# Patient Record
Sex: Female | Born: 1980 | Race: Black or African American | Hispanic: No | Marital: Married | Smoking: Former smoker
Health system: Southern US, Community
[De-identification: ages and names within clinical notes are randomized; demographics above are authoritative.]

## PROBLEM LIST (undated history)

## (undated) DIAGNOSIS — J45909 Unspecified asthma, uncomplicated: Secondary | ICD-10-CM

## (undated) DIAGNOSIS — F410 Panic disorder [episodic paroxysmal anxiety] without agoraphobia: Secondary | ICD-10-CM

## (undated) DIAGNOSIS — M545 Low back pain, unspecified: Secondary | ICD-10-CM

## (undated) DIAGNOSIS — K5090 Crohn's disease, unspecified, without complications: Secondary | ICD-10-CM

## (undated) DIAGNOSIS — E010 Iodine-deficiency related diffuse (endemic) goiter: Secondary | ICD-10-CM

## (undated) DIAGNOSIS — I10 Essential (primary) hypertension: Secondary | ICD-10-CM

## (undated) DIAGNOSIS — F32A Depression, unspecified: Secondary | ICD-10-CM

## (undated) DIAGNOSIS — Z972 Presence of dental prosthetic device (complete) (partial): Secondary | ICD-10-CM

## (undated) DIAGNOSIS — F419 Anxiety disorder, unspecified: Secondary | ICD-10-CM

## (undated) DIAGNOSIS — F329 Major depressive disorder, single episode, unspecified: Secondary | ICD-10-CM

## (undated) DIAGNOSIS — Z630 Problems in relationship with spouse or partner: Secondary | ICD-10-CM

## (undated) DIAGNOSIS — M2550 Pain in unspecified joint: Secondary | ICD-10-CM

## (undated) DIAGNOSIS — T7840XA Allergy, unspecified, initial encounter: Secondary | ICD-10-CM

## (undated) DIAGNOSIS — K219 Gastro-esophageal reflux disease without esophagitis: Secondary | ICD-10-CM

## (undated) DIAGNOSIS — G43909 Migraine, unspecified, not intractable, without status migrainosus: Secondary | ICD-10-CM

## (undated) DIAGNOSIS — R569 Unspecified convulsions: Secondary | ICD-10-CM

## (undated) HISTORY — DX: Allergy, unspecified, initial encounter: T78.40XA

## (undated) HISTORY — PX: SMALL INTESTINE SURGERY: SHX150

## (undated) HISTORY — DX: Anxiety disorder, unspecified: F41.9

## (undated) HISTORY — DX: Iodine-deficiency related diffuse (endemic) goiter: E01.0

## (undated) HISTORY — DX: Low back pain, unspecified: M54.50

## (undated) HISTORY — DX: Depression, unspecified: F32.A

## (undated) HISTORY — DX: Major depressive disorder, single episode, unspecified: F32.9

## (undated) HISTORY — DX: Panic disorder (episodic paroxysmal anxiety): F41.0

## (undated) HISTORY — DX: Unspecified convulsions: R56.9

## (undated) HISTORY — DX: Low back pain: M54.5

## (undated) HISTORY — DX: Problems in relationship with spouse or partner: Z63.0

---

## 2005-10-27 HISTORY — PX: CHOLECYSTECTOMY: SHX55

## 2006-05-30 ENCOUNTER — Emergency Department: Payer: Self-pay | Admitting: Emergency Medicine

## 2006-06-15 ENCOUNTER — Ambulatory Visit: Payer: Self-pay | Admitting: Gastroenterology

## 2006-07-08 ENCOUNTER — Ambulatory Visit: Payer: Self-pay | Admitting: General Surgery

## 2006-10-19 ENCOUNTER — Emergency Department: Payer: Self-pay | Admitting: Emergency Medicine

## 2007-10-24 ENCOUNTER — Emergency Department: Payer: Self-pay | Admitting: Emergency Medicine

## 2008-05-18 ENCOUNTER — Inpatient Hospital Stay (HOSPITAL_COMMUNITY): Admission: RE | Admit: 2008-05-18 | Discharge: 2008-05-20 | Payer: Self-pay | Admitting: Obstetrics and Gynecology

## 2008-10-27 HISTORY — PX: TUBAL LIGATION: SHX77

## 2011-03-11 NOTE — Op Note (Signed)
NAME:  Cheryl Flowers, Cheryl Flowers NO.:  [id number]   MEDICAL RECORD NO.:  [id number]          PATIENT TYPE:  INP   LOCATION:  9128                          FACILITY:  [facility]   PHYSICIAN:  Jonnie Kind, M.D. DATE OF BIRTH:  Oct 28, 1980   DATE OF PROCEDURE:  DATE OF DISCHARGE:                               OPERATIVE REPORT   PREOPERATIVE DIAGNOSES:  Pregnancy 40 plus weeks, repeat cesarean  section, off trial of labor, and elective permanent sterilization.   POSTOPERATIVE DIAGNOSES:  Pregnancy 40 plus weeks, repeat cesarean  section, off trial of labor, and elective permanent sterilization, and  extensive abdominal adhesions in lower uterine segment.   PROCEDURE:  Repeat low-transverse cervical cesarean section.   SURGEON:  Jonnie Kind, MD   ASSISTANT:  Dr. Patrice Paradise.   ANESTHESIA:  Spinal.   COMPLICATIONS:  Continued oozing from myometrial surface of lower  uterine segment adhesions requiring multiple sutures.   FINDINGS:  A healthy infant with Apgars 8 and 9.  Weight, please see  pediatrics notes.   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   DETAILS OF PROCEDURE:  The patient was taken to the operating room,  prepped and draped with lower abdominal surgery, and ellipse of skin was  removed approximately 6-7 cm wide x 30 cm in transverse length.  Removed  the skin and the old fibrotic scar, which was deep down in the lower  abdominal crease and then, we entered the fascia with transverse opening  of the fascia in standard method of Pfannenstiel.  Rectus muscles were  split in the midline and peritoneum entered without difficulty.  There  was extensive adhesions identified from the lower uterine segment to the  anterior abdominal wall and these required  resection before we could get  down to the lower uterine segment sufficiently to perform a transverse  uterine incision.  The bladder flap was developed minimally, and  transverse incision extended laterally with an index finger traction.  Fetal vertex guided in the incision and fundal pressure applied.  Malstrom flexible vacuum extractor was used to assist with the  extraction and the infant delivered easily.  The cord was clamped and  the infant taken to the nursery.  See pediatrics for further details on  the baby.   Cord blood was obtained.  Placenta was delivered by Crede uterine  massage and then the uterus inspected and confirmed as being  satisfactorily emptied.  Singular running-locking closure of the uterine  incision was performed followed by inspection of the lower uterine  segment.  There was diffuse oozing from multiple sites where the  adhesions have been transected and we used at least a dozen interrupted  sutures, a couple of figure-of-eight sutures, and some 2-0 running 2-0  chromic in that area to complete adequate hemostasis.  Point cautery was  used.  Separate film was attempted to be applied over the area once  hemostasis was adequate, but  we were unsuccessful in application.   Tubal ligation:  Tubal ligation was then performed by placing a Filshie  clip on the midportion of each tube, which was identified with  fimbriated end before application of the Filshie clip.  Hemostasis was  satisfactory.  Anterior peritoneum was closed with running 2-0 chromic.  Fascia closed with running 0 Vicryl, subcu fatty tissues recontoured  slightly and reapproximated with interrupted 2-0 plain and staple  closure of the skin, and completed the procedure.  Sponge and needle  counts were correct.   EBL:  1000 mL.      Jonnie Kind, M.D.  Electronically Signed     JVF/MEDQ  D:  05/18/2008  T:  05/19/2008  Job:  [id number]   cc:   Meda Klinefelter  Fax: [phone]

## 2011-03-11 NOTE — H&P (Signed)
NAME:  Cheryl Flowers, Cheryl Flowers NO.:  [id number]   MEDICAL RECORD NO.:  [id number]          PATIENT TYPE:  INP   LOCATION:  NA                            FACILITY:  [facility]   PHYSICIAN:  Jonnie Kind, M.D. DATE OF BIRTH:  Oct 29, 1980   DATE OF ADMISSION:  DATE OF DISCHARGE:                              HISTORY & PHYSICAL   ADMITTING DIAGNOSES:  Pregnancy 40 weeks 2 days, prior cesarean section  declining vaginal birth after cesarean due to large infant size and high  presenting part, and elective permanent sterilization.   HISTORY OF PRESENT ILLNESS:  This 30 year old female gravida 3, para 1,  AB1, LMP August 10, 2007, placing [facility], May 16, 2008, with  corresponding 12 and 20-week ultrasound with 34-week ultrasound  suggesting increased fetal growth based on the [facility] moving up to May 07, 2008, is admitted at 40 weeks 2 days for repeat cesarean section and  tubal ligation.  She has been seen for final prenatal visit on May 15, 2008, where estimated fetal weight per ultrasound is 3978 g and with  presenting part remaining at -3 station, not even felt this at all.  Review of her old record shows that the patient was induced to 40 weeks  and never progressed past 4 cm before going to low-transverse cervical  cesarean section after 4 hours of arrested labor.  Pros and cons of  proceeding with VBAC versus scheduling C-section were discussed, and the  patient desires to proceed with a repeat cesarean section.  Additionally, she desires permanent sterilization, understands the  intended permanency of the procedure and low reversibility.   PAST MEDICAL HISTORY:  Benign.   PAST SURGICAL HISTORY:  Cholecystectomy 2007.   ALLERGIES:  Allergies to MORPHINE and SULFA.  She has no LATEX  allergies.   SOCIAL HISTORY:  Married, lives with baby's father, Cheryl Flowers.  She  has a female child.  She plans circumcision.  She plans to breastfeed.  Tubal papers were signed March 28, 2008.   PHYSICAL EXAMINATION:  VITAL SIGNS:  Height 5 feet 4.5 inches, weight  202, and blood pressure 112/60.  GENERAL:  Shows a healthy African American female, alert and oriented  x3.  HEENT:  Pupils equal, round, and reactive.  NECK:  Supple.  CARDIOVASCULAR:  Unremarkable.  ABDOMEN:  40-41 cm, estimated fetal weight 3978 g, AFI 6.77.   PLAN:  Repeat cesarean section and tubal ligation, May 18, 2008.   Blood type B negative, RhoGAM administered in [place] early in the  pregnancy, rubella immunity present, hemoglobin 10, hematocrit 31,  hepatitis negative, and HIV negative.  RPR, GC, and Chlamydia all  negative.  Pap smear ASCUS and MSAFP normal.      Jonnie Kind, M.D.  Electronically Signed     JVF/MEDQ  D:  05/15/2008  T:  05/16/2008  Job:  [id number]   cc:   [facility]

## 2011-03-11 NOTE — Discharge Summary (Signed)
NAME:  Cheryl Flowers, Cheryl Flowers NO.:  [id number]   MEDICAL RECORD NO.:  [id number]          PATIENT TYPE:  INP   LOCATION:  9128                          FACILITY:  [facility]   PHYSICIAN:  Willey Blade, MD  DATE OF BIRTH:  05-30-1981   DATE OF ADMISSION:  05/18/2008  DATE OF DISCHARGE:  05/20/2008                               DISCHARGE SUMMARY   ADMITTING DIAGNOSES:  1. Pregnancy at 40 weeks and 2 days.  2. Prior cesarean section.  3. Declining vaginal birth after cesarean due to large infant size and      high-presenting part.  4. An elective permanent sterilization.   DISCHARGE DIAGNOSES:  1. Low-transverse cesarean section.  2. Bilateral tubal ligation.   PREOPERATIVE LABORATORY DATA:  White blood cell count of 8.0, hemoglobin  11.2, hematocrit 33.6, and platelets 279.  RPR was negative.  Blood type  is B negative.  HIV nonreactive.   DISCHARGE LABORATORY DATA:  Performed on May 20, 2008.  White blood  cell count 7.9, hemoglobin 8.2, hematocrit 24.0, and platelets 239.   HOSPITAL COURSE:  Cheryl Flowers underwent a repeat low-transverse  cesarean section on May 18, 2008 with bilateral tubal ligation  performed by Dr. Mallory Shirk.  The procedure was uncomplicated.  She  had an 8-pound 0-ounce female infant, whose nursery course was normal.  Her hospital stay was uneventful.  She remained afebrile throughout her  3 days here.   Her physical exam upon discharge is within normal limits.  See progress  note.   DISCHARGE MEDICATIONS:  1. Percocet 5/325 one p.o. q.4 h. as needed for pain, #30, no refills.  2. Motrin 600 mg one p.o. q.6 h. as needed for pain, #30, with no      refills.  3. Colace 100 mg one p.o. b.i.d. while on pain medicines.  4. She is also to continue with her iron supplement daily for 1 month.   The patient and family member verbalized understanding of the discharge  instructions including activity restrictions until the followup  appointment at [facility] on Monday for staple removal.      Christin Fudge, C.N.M.      Willey Blade, MD  Electronically Signed    FC/MEDQ  D:  05/20/2008  T:  05/20/2008  Job:  [id number]   cc:   [facility]

## 2011-03-29 DIAGNOSIS — S3992XA Unspecified injury of lower back, initial encounter: Secondary | ICD-10-CM

## 2011-03-29 DIAGNOSIS — M549 Dorsalgia, unspecified: Secondary | ICD-10-CM | POA: Insufficient documentation

## 2011-05-02 ENCOUNTER — Emergency Department: Payer: Self-pay | Admitting: Emergency Medicine

## 2011-07-25 LAB — CBC
HCT: 24 — ABNORMAL LOW
HCT: 25.5 — ABNORMAL LOW
HCT: 33.6 — ABNORMAL LOW
Hemoglobin: 11.2 — ABNORMAL LOW
Hemoglobin: 8.2 — ABNORMAL LOW
Hemoglobin: 8.7 — ABNORMAL LOW
MCHC: 33.4
MCHC: 34.1
MCHC: 34.3
MCV: 99.1
MCV: 99.2
MCV: 99.5
Platelets: 234
Platelets: 239
Platelets: 279
RBC: 2.42 — ABNORMAL LOW
RBC: 2.57 — ABNORMAL LOW
RBC: 3.39 — ABNORMAL LOW
RDW: 13.4
RDW: 13.7
RDW: 13.8
WBC: 10.8 — ABNORMAL HIGH
WBC: 7.9
WBC: 8

## 2011-07-25 LAB — TYPE AND SCREEN
ABO/RH(D): B NEG
Antibody Screen: NEGATIVE

## 2011-07-25 LAB — HIV ANTIBODY (ROUTINE TESTING W REFLEX): HIV: NONREACTIVE

## 2011-07-25 LAB — ABO/RH: ABO/RH(D): B NEG

## 2011-07-25 LAB — RPR: RPR Ser Ql: NONREACTIVE

## 2013-03-12 ENCOUNTER — Emergency Department: Payer: Self-pay | Admitting: Emergency Medicine

## 2015-10-09 ENCOUNTER — Other Ambulatory Visit (HOSPITAL_COMMUNITY): Payer: Self-pay | Admitting: Family Medicine

## 2015-10-09 DIAGNOSIS — E049 Nontoxic goiter, unspecified: Secondary | ICD-10-CM

## 2015-10-15 ENCOUNTER — Ambulatory Visit (HOSPITAL_COMMUNITY)
Admission: RE | Admit: 2015-10-15 | Discharge: 2015-10-15 | Disposition: A | Discharge status: Home / Self Care | Payer: BLUE CROSS/BLUE SHIELD | Source: Ambulatory Visit | Attending: Family Medicine | Admitting: Family Medicine

## 2015-10-15 DIAGNOSIS — E042 Nontoxic multinodular goiter: Secondary | ICD-10-CM | POA: Diagnosis not present

## 2015-10-15 DIAGNOSIS — E049 Nontoxic goiter, unspecified: Secondary | ICD-10-CM | POA: Diagnosis present

## 2015-12-15 ENCOUNTER — Encounter: Payer: Self-pay | Admitting: Emergency Medicine

## 2015-12-15 ENCOUNTER — Emergency Department
Admission: EM | Admit: 2015-12-15 | Discharge: 2015-12-15 | Disposition: A | Discharge status: Home / Self Care | Payer: BLUE CROSS/BLUE SHIELD | Attending: Emergency Medicine | Admitting: Emergency Medicine

## 2015-12-15 DIAGNOSIS — J020 Streptococcal pharyngitis: Secondary | ICD-10-CM | POA: Insufficient documentation

## 2015-12-15 DIAGNOSIS — J029 Acute pharyngitis, unspecified: Secondary | ICD-10-CM | POA: Diagnosis present

## 2015-12-15 DIAGNOSIS — F17210 Nicotine dependence, cigarettes, uncomplicated: Secondary | ICD-10-CM | POA: Insufficient documentation

## 2015-12-15 LAB — POCT RAPID STREP A: Streptococcus, Group A Screen (Direct): POSITIVE — AB

## 2015-12-15 MED ORDER — AMOXICILLIN 875 MG PO TABS: 875.0000 mg | ORAL_TABLET | Freq: Two times a day (BID) | ORAL | Status: DC

## 2015-12-15 MED ORDER — MAGIC MOUTHWASH W/LIDOCAINE: 5.0000 mL | Freq: Four times a day (QID) | ORAL | Status: DC | PRN

## 2015-12-15 MED ORDER — PREDNISONE 20 MG PO TABS: ORAL_TABLET | ORAL | Status: DC

## 2015-12-15 NOTE — Discharge Instructions (Signed)

## 2015-12-15 NOTE — ED Provider Notes (Signed)
[facility] Emergency Department Provider Note  ____________________________________________  Time seen: Approximately 6:15 PM  I have reviewed the triage vital signs and the nursing notes.   HISTORY  Chief Complaint Sore Throat    HPI Cheryl Flowers is a 35 y.o. female , NAD, presents emergency department with pain and swelling about the throat and fever since yesterday.Has been able to swallow liquids but significantly painful. Notes some stiffness and pain in her neck as well. Felt her right ear was clogged 2 days ago but that has resolved. Has some mild pressure in the right ear. Denies any headaches, chest pain, shortness of breath, cough. No body aches. Denies any sick exposures. Took Tylenol yesterday evening which seemed to help with fever.   No past medical history on file.  There are no active problems to display for this patient.   No past surgical history on file.  Current Outpatient Rx  Name  Route  Sig  Dispense  Refill  . amoxicillin (AMOXIL) 875 MG tablet   Oral   Take 1 tablet (875 mg total) by mouth 2 (two) times daily.   20 tablet   0   . magic mouthwash w/lidocaine SOLN   Oral   Take 5 mLs by mouth 4 (four) times daily as needed for mouth pain.   240 mL   0     Please mix 32m diphenhydramine, 862mnystatin, 80 ...   . predniSONE (DELTASONE) 20 MG tablet      Take 2 tablets by mouth, once daily, for 5 days   10 tablet   0     Allergies Morphine and related and Sulfa antibiotics  No family history on file.  Social History Social History  Substance Use Topics  . Smoking status: Current Every Day Smoker    Types: Cigarettes  . Smokeless tobacco: Never Used  . Alcohol Use: No     Review of Systems  Constitutional: Positive fever/chills Eyes: No visual changes. No discharge ENT: Positive sore throat, ear pressure. No nasal congestion, runny nose, sinus pressure. Cardiovascular: No chest  pain. Respiratory: No cough. No shortness of breath. No wheezing.  Gastrointestinal: No abdominal pain.  No nausea, vomiting.   Musculoskeletal: Positive for neck pain. Negative for back pain nor myalgias.  Skin: Negative for rash. Neurological: Negative for headaches, focal weakness or numbness. 10-point ROS otherwise negative.  ____________________________________________   PHYSICAL EXAM:  VITAL SIGNS: ED Triage Vitals  Enc Vitals Group     BP 12/15/15 1644 110/74 mmHg     Pulse Rate 12/15/15 1644 99     Resp 12/15/15 1644 18     Temp 12/15/15 1644 99 F (37.2 C)     Temp Source 12/15/15 1644 Oral     SpO2 12/15/15 1644 100 %     Weight 12/15/15 1644 203 lb (92.08 kg)     Height 12/15/15 1644 5' 5"  (1.651 m)     Head Cir --      Peak Flow --      Pain Score 12/15/15 1644 10     Pain Loc --      Pain Edu? --      Excl. in [place]--     Constitutional: Alert and oriented. Well appearing and in no acute distress. Eyes: Conjunctivae are normal. PERRL. EOMI without pain.  Head: Atraumatic. ENT:      Ears: Right TM visualized with a dusky color, bulging, serous effusion with decreased light reflex. Left ear  with mild bulging and trace serous effusion but no erythema or perforation. Bilateral external ear canals without erythema, swelling, discharge.      Nose: No congestion/rhinnorhea.      Mouth/Throat:  Pharynx with moderate erythema and white exudate about bilateral tonsillar areas. Mild swelling is noted. Clear postnasal drip Mucous membranes are moist.  Neck: No stridor. No cervical spine tenderness to palpation. Neck is supple with full range of motion. Hematological/Lymphatic/Immunilogical: Positive focal bilateral anterior cervical lymphadenopathy with mild tenderness to palpation but are mobile. Cardiovascular: Normal rate, regular rhythm. Normal S1 and S2.  Good peripheral circulation. Respiratory: Normal respiratory effort without tachypnea or retractions. Lungs  CTAB. Neurologic:  Normal speech and language. No gross focal neurologic deficits are appreciated.  Skin:  Skin is warm, dry and intact. No rash noted. Psychiatric: Mood and affect are normal. Speech and behavior are normal. Patient exhibits appropriate insight and judgement.   ____________________________________________   LABS (all labs ordered are listed, but only abnormal results are displayed)  Labs Reviewed - No data to display ____________________________________________  EKG  None ____________________________________________  RADIOLOGY  None ____________________________________________    PROCEDURES  Procedure(s) performed: None   Medications - No data to display   ____________________________________________   INITIAL IMPRESSION / ASSESSMENT AND PLAN / ED COURSE  Pertinent lab results that were available during my care of the patient were reviewed by me and considered in my medical decision making (see chart for details).  Patient's diagnosis is consistent with strep pharyngitis. Patient will be discharged home with prescriptions for amoxicillin 875 mg tablets to take one tablet by mouth twice daily for 10 days. Will also give magic mouthwash with lidocaine and prednisone 20 mg tablets to take 2 tablets by mouth once daily for 5 days to decrease swelling and pain. May continue Tylenol as needed. Push fluids to remain hydrated. Patient is to follow up with primary care provider if symptoms persist past this treatment course. Patient is given ED precautions to return to the ED for any worsening or new symptoms.    ____________________________________________  FINAL CLINICAL IMPRESSION(S) / ED DIAGNOSES  Final diagnoses:  None      NEW MEDICATIONS STARTED DURING THIS VISIT:  New Prescriptions   AMOXICILLIN (AMOXIL) 875 MG TABLET    Take 1 tablet (875 mg total) by mouth 2 (two) times daily.   MAGIC MOUTHWASH W/LIDOCAINE SOLN    Take 5 mLs by mouth 4  (four) times daily as needed for mouth pain.   PREDNISONE (DELTASONE) 20 MG TABLET    Take 2 tablets by mouth, once daily, for 5 days         Braxton Feathers, PA-C 12/15/15 1846  Lavonia Drafts, MD 12/15/15 [id number]

## 2015-12-15 NOTE — ED Notes (Signed)
Pt c/o sore throat and neck pain/stiffness/soreness - Pt states sick for 1 day - Denies vomiting or upset stomachache - Pt c/o severe headache yesterday

## 2015-12-15 NOTE — ED Notes (Addendum)
Pain and swelling in throat since yesterday. Fever of 101. 3 starting last night. able to swallow liquids but painful.

## 2017-06-06 DIAGNOSIS — M5441 Lumbago with sciatica, right side: Secondary | ICD-10-CM | POA: Insufficient documentation

## 2017-06-06 DIAGNOSIS — Z630 Problems in relationship with spouse or partner: Secondary | ICD-10-CM | POA: Insufficient documentation

## 2017-07-06 DIAGNOSIS — R079 Chest pain, unspecified: Secondary | ICD-10-CM | POA: Diagnosis not present

## 2017-10-30 ENCOUNTER — Ambulatory Visit: Payer: Medicaid Other | Admitting: Family Medicine

## 2017-10-30 ENCOUNTER — Encounter: Payer: Self-pay | Admitting: Family Medicine

## 2017-10-30 VITALS — BP 126/84 | HR 104 | Temp 98.1°F | Wt 217.3 lb

## 2017-10-30 DIAGNOSIS — R635 Abnormal weight gain: Secondary | ICD-10-CM | POA: Diagnosis not present

## 2017-10-30 DIAGNOSIS — Z7689 Persons encountering health services in other specified circumstances: Secondary | ICD-10-CM

## 2017-10-30 DIAGNOSIS — E010 Iodine-deficiency related diffuse (endemic) goiter: Secondary | ICD-10-CM

## 2017-10-30 DIAGNOSIS — F3341 Major depressive disorder, recurrent, in partial remission: Secondary | ICD-10-CM | POA: Diagnosis not present

## 2017-10-30 DIAGNOSIS — F419 Anxiety disorder, unspecified: Secondary | ICD-10-CM | POA: Diagnosis not present

## 2017-10-30 MED ORDER — TRIAMTERENE-HCTZ 37.5-25 MG PO TABS: 1.0000 | ORAL_TABLET | Freq: Every day | ORAL | 1 refills | Status: DC

## 2017-10-30 MED ORDER — HYDROXYZINE HCL 25 MG PO TABS: 25.0000 mg | ORAL_TABLET | Freq: Three times a day (TID) | ORAL | 2 refills | Status: DC | PRN

## 2017-10-30 MED ORDER — ESCITALOPRAM OXALATE 20 MG PO TABS: 20.0000 mg | ORAL_TABLET | Freq: Every day | ORAL | 2 refills | Status: DC

## 2017-10-30 NOTE — Progress Notes (Signed)
BP 126/84 (BP Location: Left Arm, Patient Position: Sitting, Cuff Size: Normal)   Pulse (!) 104   Temp 98.1 F (36.7 C) (Oral)   Wt 217 lb 4.8 oz (98.6 kg)   SpO2 99%   BMI 36.16 kg/m    Subjective:    Patient ID: Cheryl Flowers, female    DOB: May 23, 1981, 37 y.o.   MRN: [id number]  HPI: Cheryl Flowers is a 37 y.o. female  Chief Complaint  Patient presents with  . Establish Care  . Weight Gain   Pt here today to establish care. Hx of anxiety and depression for years. Started lexapro about a month ago and doing fairly well on it. Was taking paxil and hydroxyzine with no relief. Still having the issues with anxiety but states the depression has gotten better. Denies SI/HI. Does not currently attend counseling.   Worried about her weight. States she's struggled most of her life with managing her weight. Has been to a nutritionist and tried diet and exercise. Had good success with keto several years ago, lost about 20-30 lb.   Last CPE was about 2 years ago, PAP smear was about 3 years ago - WNL.   Has a thyroid u/s coming up next week to monitor thyroid nodules found several years ago. Recently had labs drawn which were normal.   On maxzide daily for b/l hand and leg swelling. Has been on that for about 2 years now. Doing well, no adverse effects noted.   Past Medical History:  Diagnosis Date  . Anxiety   . Depression   . Low back pain   . Marital conflict   . Panic disorder   . Thyromegaly    Social History   Socioeconomic History  . Marital status: Married    Spouse name: Not on file  . Number of children: Not on file  . Years of education: Not on file  . Highest education level: Not on file  Social Needs  . Financial resource strain: Not on file  . Food insecurity - worry: Not on file  . Food insecurity - inability: Not on file  . Transportation needs - medical: Not on file  . Transportation needs - non-medical: Not on file  Occupational  History  . Not on file  Tobacco Use  . Smoking status: Former Smoker    Types: Cigarettes  . Smokeless tobacco: Never Used  Substance and Sexual Activity  . Alcohol use: No  . Drug use: No  . Sexual activity: Not on file  Other Topics Concern  . Not on file  Social History Narrative  . Not on file   Relevant past medical, surgical, family and social history reviewed and updated as indicated. Interim medical history since our last visit reviewed. Allergies and medications reviewed and updated.  Review of Systems  Constitutional: Positive for unexpected weight change.  HENT: Negative.   Respiratory: Negative.   Cardiovascular: Negative.   Gastrointestinal: Negative.   Genitourinary: Negative.   Musculoskeletal: Negative.   Neurological: Negative.   Psychiatric/Behavioral: Positive for dysphoric mood. The patient is nervous/anxious.    Per HPI unless specifically indicated above     Objective:    BP 126/84 (BP Location: Left Arm, Patient Position: Sitting, Cuff Size: Normal)   Pulse (!) 104   Temp 98.1 F (36.7 C) (Oral)   Wt 217 lb 4.8 oz (98.6 kg)   SpO2 99%   BMI 36.16 kg/m   Wt Readings from Last 3 Encounters:  10/30/17 217 lb 4.8 oz (98.6 kg)  12/15/15 203 lb (92.1 kg)    Physical Exam  Constitutional: She is oriented to person, place, and time. She appears well-developed and well-nourished. No distress.  HENT:  Head: Atraumatic.  Eyes: Conjunctivae are normal. Pupils are equal, round, and reactive to light. No scleral icterus.  Neck: Normal range of motion. Neck supple. Thyromegaly present.  Cardiovascular: Normal rate and normal heart sounds.  Pulmonary/Chest: Effort normal and breath sounds normal.  Musculoskeletal: Normal range of motion.  Lymphadenopathy:    She has no cervical adenopathy.  Neurological: She is alert and oriented to person, place, and time.  Skin: Skin is warm and dry.  Psychiatric: She has a normal mood and affect. Her behavior is  normal.  Nursing note and vitals reviewed.  Results for orders placed or performed during the hospital encounter of 12/15/15  POCT rapid strep A [facility])  Result Value Ref Range   Streptococcus, Group A Screen (Direct) POSITIVE (A) NEGATIVE      Assessment & Plan:   Problem List Items Addressed This Visit      Endocrine   Thyromegaly    Await u/s report ordered by previous provider. Pt to send [place] report. Recent labs WNL        Other   Depression - Primary    Doing very well on 20 mg lexapro, not wanting to change or increase as it seems to be helping her depression quite a bit. Continue current regimen.       Relevant Medications   hydrOXYzine (ATARAX/VISTARIL) 25 MG tablet   escitalopram (LEXAPRO) 20 MG tablet   Anxiety    Will try the hydroxyzine again for her breakthrough anxiety now that she's on a successful SSRI for her. Also highly recommended regular counseling in addition.       Relevant Medications   hydrOXYzine (ATARAX/VISTARIL) 25 MG tablet   escitalopram (LEXAPRO) 20 MG tablet    Other Visit Diagnoses    Encounter to establish care       Weight gain       Pt will restart keto as well as regular exercise regimen. Discussed poor sustainability of wt loss medications, will monitor closely over next few months w/o       Follow up plan: Return in about 3 months (around 01/28/2018) for Depression and anxiety f/u.

## 2017-11-02 DIAGNOSIS — E010 Iodine-deficiency related diffuse (endemic) goiter: Secondary | ICD-10-CM | POA: Insufficient documentation

## 2017-11-02 DIAGNOSIS — F329 Major depressive disorder, single episode, unspecified: Secondary | ICD-10-CM | POA: Insufficient documentation

## 2017-11-02 DIAGNOSIS — F411 Generalized anxiety disorder: Secondary | ICD-10-CM

## 2017-11-02 DIAGNOSIS — F32A Depression, unspecified: Secondary | ICD-10-CM | POA: Insufficient documentation

## 2017-11-02 DIAGNOSIS — F410 Panic disorder [episodic paroxysmal anxiety] without agoraphobia: Secondary | ICD-10-CM | POA: Insufficient documentation

## 2017-11-02 NOTE — Assessment & Plan Note (Signed)
Await u/s report ordered by previous provider. Pt to send [place] report. Recent labs WNL

## 2017-11-02 NOTE — Assessment & Plan Note (Signed)
Will try the hydroxyzine again for her breakthrough anxiety now that she's on a successful SSRI for her. Also highly recommended regular counseling in addition.

## 2017-11-02 NOTE — Assessment & Plan Note (Signed)
Doing very well on 20 mg lexapro, not wanting to change or increase as it seems to be helping her depression quite a bit. Continue current regimen.

## 2017-11-02 NOTE — Patient Instructions (Signed)
Follow up as needed

## 2018-01-19 ENCOUNTER — Encounter: Payer: Self-pay | Admitting: Family Medicine

## 2018-01-20 ENCOUNTER — Other Ambulatory Visit: Payer: Self-pay | Admitting: Family Medicine

## 2018-01-20 MED ORDER — HYDROXYZINE HCL 25 MG PO TABS: 25.0000 mg | ORAL_TABLET | Freq: Three times a day (TID) | ORAL | 3 refills | Status: DC | PRN

## 2018-01-29 ENCOUNTER — Ambulatory Visit (INDEPENDENT_AMBULATORY_CARE_PROVIDER_SITE_OTHER): Payer: 59 | Admitting: Family Medicine

## 2018-01-29 ENCOUNTER — Ambulatory Visit: Payer: Medicaid Other | Admitting: Family Medicine

## 2018-01-29 ENCOUNTER — Ambulatory Visit
Admission: RE | Admit: 2018-01-29 | Discharge: 2018-01-29 | Disposition: A | Discharge status: Home / Self Care | Payer: 59 | Source: Ambulatory Visit | Attending: Family Medicine | Admitting: Family Medicine

## 2018-01-29 ENCOUNTER — Encounter: Payer: Self-pay | Admitting: Family Medicine

## 2018-01-29 VITALS — BP 113/72 | HR 92 | Temp 98.5°F | Ht 64.0 in | Wt 211.0 lb

## 2018-01-29 DIAGNOSIS — E669 Obesity, unspecified: Secondary | ICD-10-CM

## 2018-01-29 DIAGNOSIS — F419 Anxiety disorder, unspecified: Secondary | ICD-10-CM

## 2018-01-29 DIAGNOSIS — G8929 Other chronic pain: Secondary | ICD-10-CM | POA: Insufficient documentation

## 2018-01-29 DIAGNOSIS — G4700 Insomnia, unspecified: Secondary | ICD-10-CM

## 2018-01-29 DIAGNOSIS — M5441 Lumbago with sciatica, right side: Secondary | ICD-10-CM | POA: Diagnosis present

## 2018-01-29 DIAGNOSIS — F331 Major depressive disorder, recurrent, moderate: Secondary | ICD-10-CM

## 2018-01-29 MED ORDER — QUETIAPINE FUMARATE 100 MG PO TABS: 100.0000 mg | ORAL_TABLET | Freq: Every day | ORAL | 1 refills | Status: DC

## 2018-01-29 MED ORDER — TRAMADOL HCL 50 MG PO TABS: 50.0000 mg | ORAL_TABLET | Freq: Three times a day (TID) | ORAL | 0 refills | Status: DC | PRN

## 2018-01-29 MED ORDER — CYCLOBENZAPRINE HCL 10 MG PO TABS: 10.0000 mg | ORAL_TABLET | Freq: Three times a day (TID) | ORAL | 0 refills | Status: DC | PRN

## 2018-01-29 NOTE — Patient Instructions (Signed)
Piriformis Syndrome Rehab Ask your health care provider which exercises are safe for you. Do exercises exactly as told by your health care provider and adjust them as directed. It is normal to feel mild stretching, pulling, tightness, or discomfort as you do these exercises, but you should stop right away if you feel sudden pain or your pain gets worse.Do not begin these exercises until told by your health care provider. Stretching and range of motion exercises These exercises warm up your muscles and joints and improve the movement and flexibility of your hip and pelvis. These exercises also help to relieve pain, numbness, and tingling. Exercise A: Hip rotators  1. Lie on your back on a firm surface. 2. Pull your left / right knee toward your same shoulder with your left / right hand until your knee is pointing toward the ceiling. Hold your left / right ankle with your other hand. 3. Keeping your knee steady, gently pull your left / right ankle toward your other shoulder until you feel a stretch in your buttocks. 4. Hold this position for __________ seconds. Repeat __________ times. Complete this stretch __________ times a day. Exercise B: Hip extensors 1. Lie on your back on a firm surface. Both of your legs should be straight. 2. Pull your left / right knee to your chest. Hold your leg in this position by holding onto the back of your thigh or the front of your knee. 3. Hold this position for __________ seconds. 4. Slowly return to the starting position. Repeat __________ times. Complete this stretch __________ times a day. Strengthening exercises These exercises build strength and endurance in your hip and thigh muscles. Endurance is the ability to use your muscles for a long time, even after they get tired. Exercise C: Straight leg raises ( hip abductors) 1. Lie on your side with your left / right leg in the top position. Lie so your head, shoulder, knee, and hip line up. Bend your bottom  knee to help you balance. 2. Lift your top leg up 4-6 inches (10-15 cm), keeping your toes pointed straight ahead. 3. Hold this position for __________ seconds. 4. Slowly lower your leg to the starting position. Let your muscles relax completely. Repeat __________ times. Complete this exercise__________ times a day. Exercise D: Hip abductors and rotators, quadruped  1. Get on your hands and knees on a firm, lightly padded surface. Your hands should be directly below your shoulders, and your knees should be directly below your hips. 2. Lift your left / right knee out to the side. Keep your knee bent. Do not twist your body. 3. Hold this position for __________ seconds. 4. Slowly lower your leg. Repeat __________ times. Complete this exercise__________ times a day. Exercise E: Straight leg raises ( hip extensors) 1. Lie on your abdomen on a bed or a firm surface with a pillow under your hips. 2. Squeeze your buttock muscles and lift your left / right thigh off the bed. Do not let your back arch. 3. Hold this position for __________ seconds. 4. Slowly return to the starting position. Let your muscles relax completely before doing another repetition. Repeat __________ times. Complete this exercise__________ times a day. This information is not intended to replace advice given to you by your health care provider. Make sure you discuss any questions you have with your health care provider. Document Released: 10/13/2005 Document Revised: 06/17/2016 Document Reviewed: 09/25/2015 Elsevier Interactive Patient Education  2018 [employer].  Piriformis Syndrome Rehab Ask your  health care provider which exercises are safe for you. Do exercises exactly as told by your health care provider and adjust them as directed. It is normal to feel mild stretching, pulling, tightness, or discomfort as you do these exercises, but you should stop right away if you feel sudden pain or your pain gets worse.Do not begin  these exercises until told by your health care provider. Stretching and range of motion exercises These exercises warm up your muscles and joints and improve the movement and flexibility of your hip and pelvis. These exercises also help to relieve pain, numbness, and tingling. Exercise A: Hip rotators  5. Lie on your back on a firm surface. 6. Pull your left / right knee toward your same shoulder with your left / right hand until your knee is pointing toward the ceiling. Hold your left / right ankle with your other hand. 7. Keeping your knee steady, gently pull your left / right ankle toward your other shoulder until you feel a stretch in your buttocks. 8. Hold this position for __________ seconds. Repeat __________ times. Complete this stretch __________ times a day. Exercise B: Hip extensors 5. Lie on your back on a firm surface. Both of your legs should be straight. 6. Pull your left / right knee to your chest. Hold your leg in this position by holding onto the back of your thigh or the front of your knee. 7. Hold this position for __________ seconds. 8. Slowly return to the starting position. Repeat __________ times. Complete this stretch __________ times a day. Strengthening exercises These exercises build strength and endurance in your hip and thigh muscles. Endurance is the ability to use your muscles for a long time, even after they get tired. Exercise C: Straight leg raises ( hip abductors) 5. Lie on your side with your left / right leg in the top position. Lie so your head, shoulder, knee, and hip line up. Bend your bottom knee to help you balance. 6. Lift your top leg up 4-6 inches (10-15 cm), keeping your toes pointed straight ahead. 7. Hold this position for __________ seconds. 8. Slowly lower your leg to the starting position. Let your muscles relax completely. Repeat __________ times. Complete this exercise__________ times a day. Exercise D: Hip abductors and rotators,  quadruped  5. Get on your hands and knees on a firm, lightly padded surface. Your hands should be directly below your shoulders, and your knees should be directly below your hips. 6. Lift your left / right knee out to the side. Keep your knee bent. Do not twist your body. 7. Hold this position for __________ seconds. 8. Slowly lower your leg. Repeat __________ times. Complete this exercise__________ times a day. Exercise E: Straight leg raises ( hip extensors) 5. Lie on your abdomen on a bed or a firm surface with a pillow under your hips. 6. Squeeze your buttock muscles and lift your left / right thigh off the bed. Do not let your back arch. 7. Hold this position for __________ seconds. 8. Slowly return to the starting position. Let your muscles relax completely before doing another repetition. Repeat __________ times. Complete this exercise__________ times a day. This information is not intended to replace advice given to you by your health care provider. Make sure you discuss any questions you have with your health care provider. Document Released: 10/13/2005 Document Revised: 06/17/2016 Document Reviewed: 09/25/2015 Elsevier Interactive Patient Education  [employer].

## 2018-01-29 NOTE — Progress Notes (Signed)
BP 113/72 (BP Location: Right Arm, Patient Position: Sitting, Cuff Size: Normal)   Pulse 92   Temp 98.5 F (36.9 C) (Oral)   Ht 5' 4"  (1.626 m)   Wt 211 lb (95.7 kg)   SpO2 100%   BMI 36.22 kg/m    Subjective:    Patient ID: Cheryl Flowers, female    DOB: 10-Jan-1981, 36 y.o.   MRN: [id number]  HPI: Cheryl Flowers is a 37 y.o. female  Chief Complaint  Patient presents with  . Follow-up  . Anxiety  . Depression  . Back Pain    Ongoing for two weeks. Lower back, right sided. Pain will radiate to pelvic and buttox area.  . Weight Problem    Patient trying to lose weight and feels unsuccessful. Has lost 7lbs since January.   Pt here today for anxiety and depression f/u. Lots of new stressors lately, feels much more depressed than previously. Lexapro and hydroxyzine helping with the anxiety but still having dysphoric moods and significant insomnia. Denies SI/HI, manic episodes, severe mood swings, appetite changes. No side effects with the medications.   Has always had back pain, but it's flared the past 2 weeks or so. No known trigger. Radiates from right low back down toward groin, worst with movement. Took some leftover tramadol with mild relief and has been doing exercises daily. Denies fever, chills, incontinence issues, leg weakness, numbness, tingling.   Also having concerns about lack of success with weight loss. Has lost a few pounds lately but feeling like with as much effort as she's been putting in to change lifestyle behaviors it should be much more.   Depression screen PHQ 2/9 01/29/2018  Decreased Interest 2  Down, Depressed, Hopeless 0  PHQ - 2 Score 2  Altered sleeping 2  Tired, decreased energy 3  Change in appetite 0  Feeling bad or failure about yourself  1  Trouble concentrating 3  Moving slowly or fidgety/restless 1  Suicidal thoughts 0  PHQ-9 Score 12  Difficult doing work/chores Very difficult   GAD 7 : Generalized Anxiety Score  01/29/2018  Nervous, Anxious, on Edge 0  Control/stop worrying 2  Worry too much - different things 2  Trouble relaxing 0  Restless 0  Easily annoyed or irritable 1  Afraid - awful might happen 2  Total GAD 7 Score 7  Anxiety Difficulty Very difficult   Relevant past medical, surgical, family and social history reviewed and updated as indicated. Interim medical history since our last visit reviewed. Allergies and medications reviewed and updated.  Review of Systems  Per HPI unless specifically indicated above     Objective:    BP 113/72 (BP Location: Right Arm, Patient Position: Sitting, Cuff Size: Normal)   Pulse 92   Temp 98.5 F (36.9 C) (Oral)   Ht 5' 4"  (1.626 m)   Wt 211 lb (95.7 kg)   SpO2 100%   BMI 36.22 kg/m   Wt Readings from Last 3 Encounters:  01/29/18 211 lb (95.7 kg)  10/30/17 217 lb 4.8 oz (98.6 kg)  12/15/15 203 lb (92.1 kg)    Physical Exam  Constitutional: She is oriented to person, place, and time. She appears well-developed and well-nourished.  HENT:  Head: Atraumatic.  Eyes: Pupils are equal, round, and reactive to light. Conjunctivae are normal.  Neck: Normal range of motion. Neck supple.  Cardiovascular: Normal rate and normal heart sounds.  Pulmonary/Chest: Effort normal and breath sounds normal. No respiratory distress.  Musculoskeletal: Normal range of motion. She exhibits tenderness (right low back laterally). She exhibits no edema or deformity.  Neurological: She is alert and oriented to person, place, and time.  Skin: Skin is warm and dry.  Psychiatric: She has a normal mood and affect. Her behavior is normal.  Nursing note and vitals reviewed.   Results for orders placed or performed during the hospital encounter of 12/15/15  POCT rapid strep A [facility])  Result Value Ref Range   Streptococcus, Group A Screen (Direct) POSITIVE (A) NEGATIVE      Assessment & Plan:   Problem List Items Addressed This Visit      Other    Depression - Primary    Poor control currently with increased stressors. Will add seroquel to help with moods and sleep. List of counselors given, pt open to starting regular counseling. Will continue lexapro and hydroxyzine as well. Monitor closely for benefit      Anxiety    Continue current regimen, stable and under good control      Obesity (BMI 35.0-39.9 without comorbidity)    Counseled on diet and exercise, and discussed checking basic labs soon at a CPE to make sure there's no organic causes of her resistance to losing weight       Other Visit Diagnoses    Chronic right-sided low back pain with right-sided sciatica       Will tx with flexeril, heat, stretches. Will obtain low back films given chronicity of pain. Small supply of tramadol given for severe pain episodes.    Relevant Medications   QUEtiapine (SEROQUEL) 100 MG tablet   cyclobenzaprine (FLEXERIL) 10 MG tablet   traMADol (ULTRAM) 50 MG tablet   Other Relevant Orders   DG Lumbar Spine Complete (Completed)   Insomnia, unspecified type       Will start seroquel, sleep hygiene tactics reviewed at length       Follow up plan: Return in about 1 month (around 02/26/2018) for CPE, sleep f/u, mood f/u.

## 2018-02-01 NOTE — Assessment & Plan Note (Signed)
Counseled on diet and exercise, and discussed checking basic labs soon at a CPE to make sure there's no organic causes of her resistance to losing weight

## 2018-02-01 NOTE — Assessment & Plan Note (Signed)
Continue current regimen, stable and under good control

## 2018-02-01 NOTE — Assessment & Plan Note (Signed)
Poor control currently with increased stressors. Will add seroquel to help with moods and sleep. List of counselors given, pt open to starting regular counseling. Will continue lexapro and hydroxyzine as well. Monitor closely for benefit

## 2018-02-20 ENCOUNTER — Observation Stay: Payer: 59

## 2018-02-20 ENCOUNTER — Emergency Department: Payer: 59

## 2018-02-20 ENCOUNTER — Other Ambulatory Visit: Payer: Self-pay

## 2018-02-20 ENCOUNTER — Observation Stay
Admission: EM | Admit: 2018-02-20 | Discharge: 2018-02-20 | Disposition: A | Discharge status: Home / Self Care | Payer: 59 | Attending: Internal Medicine | Admitting: Internal Medicine

## 2018-02-20 DIAGNOSIS — Q048 Other specified congenital malformations of brain: Secondary | ICD-10-CM | POA: Diagnosis not present

## 2018-02-20 DIAGNOSIS — I469 Cardiac arrest, cause unspecified: Secondary | ICD-10-CM

## 2018-02-20 DIAGNOSIS — I10 Essential (primary) hypertension: Secondary | ICD-10-CM | POA: Insufficient documentation

## 2018-02-20 DIAGNOSIS — J341 Cyst and mucocele of nose and nasal sinus: Secondary | ICD-10-CM | POA: Diagnosis not present

## 2018-02-20 DIAGNOSIS — F10129 Alcohol abuse with intoxication, unspecified: Secondary | ICD-10-CM | POA: Diagnosis not present

## 2018-02-20 DIAGNOSIS — R0681 Apnea, not elsewhere classified: Secondary | ICD-10-CM | POA: Diagnosis not present

## 2018-02-20 DIAGNOSIS — Z87891 Personal history of nicotine dependence: Secondary | ICD-10-CM | POA: Diagnosis not present

## 2018-02-20 DIAGNOSIS — E669 Obesity, unspecified: Secondary | ICD-10-CM | POA: Insufficient documentation

## 2018-02-20 DIAGNOSIS — F419 Anxiety disorder, unspecified: Secondary | ICD-10-CM | POA: Insufficient documentation

## 2018-02-20 DIAGNOSIS — R569 Unspecified convulsions: Secondary | ICD-10-CM | POA: Insufficient documentation

## 2018-02-20 DIAGNOSIS — R55 Syncope and collapse: Principal | ICD-10-CM | POA: Diagnosis present

## 2018-02-20 DIAGNOSIS — R739 Hyperglycemia, unspecified: Secondary | ICD-10-CM | POA: Insufficient documentation

## 2018-02-20 DIAGNOSIS — Z885 Allergy status to narcotic agent status: Secondary | ICD-10-CM | POA: Insufficient documentation

## 2018-02-20 DIAGNOSIS — Z6835 Body mass index (BMI) 35.0-35.9, adult: Secondary | ICD-10-CM | POA: Insufficient documentation

## 2018-02-20 DIAGNOSIS — Z882 Allergy status to sulfonamides status: Secondary | ICD-10-CM | POA: Insufficient documentation

## 2018-02-20 DIAGNOSIS — F329 Major depressive disorder, single episode, unspecified: Secondary | ICD-10-CM | POA: Diagnosis not present

## 2018-02-20 DIAGNOSIS — Z79899 Other long term (current) drug therapy: Secondary | ICD-10-CM | POA: Insufficient documentation

## 2018-02-20 DIAGNOSIS — R000 Tachycardia, unspecified: Secondary | ICD-10-CM | POA: Diagnosis not present

## 2018-02-20 LAB — COMPREHENSIVE METABOLIC PANEL
ALT: 22 U/L (ref 14–54)
AST: 22 U/L (ref 15–41)
Albumin: 3.9 g/dL (ref 3.5–5.0)
Alkaline Phosphatase: 65 U/L (ref 38–126)
Anion gap: 12 (ref 5–15)
BUN: 7 mg/dL (ref 6–20)
CO2: 19 mmol/L — ABNORMAL LOW (ref 22–32)
Calcium: 8.9 mg/dL (ref 8.9–10.3)
Chloride: 107 mmol/L (ref 101–111)
Creatinine, Ser: 0.73 mg/dL (ref 0.44–1.00)
GFR calc Af Amer: >60 mL/min (ref >60)
GFR calc non Af Amer: >60 mL/min (ref >60)
Glucose, Bld: 101 mg/dL — ABNORMAL HIGH (ref 65–99)
Potassium: 3.6 mmol/L (ref 3.5–5.1)
Sodium: 138 mmol/L (ref 135–145)
Total Bilirubin: 0.3 mg/dL (ref 0.3–1.2)
Total Protein: 7.7 g/dL (ref 6.5–8.1)

## 2018-02-20 LAB — CBC WITH DIFFERENTIAL/PLATELET
Basophils Absolute: 0.1 K/uL (ref 0–0.1)
Basophils Relative: 1 %
Eosinophils Absolute: 0.1 K/uL (ref 0–0.7)
Eosinophils Relative: 1 %
HCT: 38.6 % (ref 35.0–47.0)
Hemoglobin: 13.2 g/dL (ref 12.0–16.0)
Lymphocytes Relative: 23 %
Lymphs Abs: 2.1 K/uL (ref 1.0–3.6)
MCH: 31.7 pg (ref 26.0–34.0)
MCHC: 34.1 g/dL (ref 32.0–36.0)
MCV: 93 fL (ref 80.0–100.0)
Monocytes Absolute: 0.4 K/uL (ref 0.2–0.9)
Monocytes Relative: 5 %
Neutro Abs: 6.4 K/uL (ref 1.4–6.5)
Neutrophils Relative %: 70 %
Platelets: 334 K/uL (ref 150–440)
RBC: 4.15 MIL/uL (ref 3.80–5.20)
RDW: 13.6 % (ref 11.5–14.5)
WBC: 9.1 K/uL (ref 3.6–11.0)

## 2018-02-20 LAB — URINE DRUG SCREEN, QUALITATIVE (ARMC ONLY)
Amphetamines, Ur Screen: NOT DETECTED
Barbiturates, Ur Screen: NOT DETECTED
Benzodiazepine, Ur Scrn: POSITIVE — AB
Cannabinoid 50 Ng, Ur ~~LOC~~: NOT DETECTED
Cocaine Metabolite,Ur ~~LOC~~: NOT DETECTED
MDMA (Ecstasy)Ur Screen: NOT DETECTED
Methadone Scn, Ur: NOT DETECTED
Opiate, Ur Screen: NOT DETECTED
Phencyclidine (PCP) Ur S: NOT DETECTED
Tricyclic, Ur Screen: NOT DETECTED

## 2018-02-20 LAB — HEMOGLOBIN A1C
Hgb A1c MFr Bld: 5.2 % (ref 4.8–5.6)
Mean Plasma Glucose: 102.54 mg/dL

## 2018-02-20 LAB — URINALYSIS, COMPLETE (UACMP) WITH MICROSCOPIC
Bacteria, UA: NONE SEEN
Bilirubin Urine: NEGATIVE
Glucose, UA: NEGATIVE mg/dL
Ketones, ur: NEGATIVE mg/dL
Leukocytes, UA: NEGATIVE
Nitrite: NEGATIVE
Protein, ur: NEGATIVE mg/dL
Specific Gravity, Urine: 1.006 (ref 1.005–1.030)
pH: 8 (ref 5.0–8.0)

## 2018-02-20 LAB — BLOOD GAS, ARTERIAL
Acid-base deficit: 0.3 mmol/L (ref 0.0–2.0)
Bicarbonate: 19.2 mmol/L — ABNORMAL LOW (ref 20.0–28.0)
FIO2: 100
O2 Saturation: 99.9 %
Patient temperature: 37
pCO2 arterial: 20 mmHg — ABNORMAL LOW (ref 32.0–48.0)
pH, Arterial: 7.59 — ABNORMAL HIGH (ref 7.350–7.450)
pO2, Arterial: 272 mmHg — ABNORMAL HIGH (ref 83.0–108.0)

## 2018-02-20 LAB — ACETAMINOPHEN LEVEL: Acetaminophen (Tylenol), Serum: <10 ug/mL — ABNORMAL LOW (ref 10–30)

## 2018-02-20 LAB — ETHANOL: Alcohol, Ethyl (B): 97 mg/dL — ABNORMAL HIGH

## 2018-02-20 LAB — POCT PREGNANCY, URINE: Preg Test, Ur: NEGATIVE

## 2018-02-20 LAB — TROPONIN I: Troponin I: <0.03 ng/mL

## 2018-02-20 LAB — SALICYLATE LEVEL: Salicylate Lvl: <7 mg/dL (ref 2.8–30.0)

## 2018-02-20 MED ORDER — TRIAMTERENE-HCTZ 37.5-25 MG PO TABS
1.0000 | ORAL_TABLET | Freq: Every day | ORAL | Status: DC
  Administered 2018-02-20: 1 via ORAL
  Filled 2018-02-20: qty 1

## 2018-02-20 MED ORDER — LORAZEPAM 2 MG/ML IJ SOLN
INTRAMUSCULAR | Status: AC
  Administered 2018-02-20: 1 mg
  Filled 2018-02-20: qty 1

## 2018-02-20 MED ORDER — SODIUM CHLORIDE 0.9 % IV BOLUS
1000.0000 mL | Freq: Once | INTRAVENOUS | Status: AC
  Administered 2018-02-20: 1000 mL via INTRAVENOUS

## 2018-02-20 MED ORDER — SODIUM CHLORIDE 0.9 % IV SOLN
75.0000 mL/h | INTRAVENOUS | Status: DC
  Administered 2018-02-20: 75 mL/h via INTRAVENOUS

## 2018-02-20 MED ORDER — ACETAMINOPHEN 325 MG PO TABS: 650.0000 mg | ORAL_TABLET | ORAL | Status: DC | PRN

## 2018-02-20 MED ORDER — ESCITALOPRAM OXALATE 20 MG PO TABS: 20.0000 mg | ORAL_TABLET | Freq: Every day | ORAL | 2 refills | Status: DC

## 2018-02-20 MED ORDER — LEVETIRACETAM IN NACL 500 MG/100ML IV SOLN: 500.0000 mg | Freq: Once | INTRAVENOUS | Status: DC

## 2018-02-20 MED ORDER — BISACODYL 5 MG PO TBEC: 5.0000 mg | DELAYED_RELEASE_TABLET | Freq: Every day | ORAL | Status: DC | PRN

## 2018-02-20 MED ORDER — LEVETIRACETAM 500 MG/5ML IV SOLN
1000.0000 mg | Freq: Once | INTRAVENOUS | Status: AC
  Administered 2018-02-20: 1000 mg via INTRAVENOUS
  Filled 2018-02-20: qty 10

## 2018-02-20 MED ORDER — ONDANSETRON HCL 4 MG/2ML IJ SOLN: 4.0000 mg | Freq: Four times a day (QID) | INTRAMUSCULAR | Status: DC | PRN

## 2018-02-20 MED ORDER — ONDANSETRON HCL 4 MG PO TABS: 4.0000 mg | ORAL_TABLET | Freq: Four times a day (QID) | ORAL | Status: DC | PRN

## 2018-02-20 MED ORDER — ENOXAPARIN SODIUM 40 MG/0.4ML ~~LOC~~ SOLN
40.0000 mg | SUBCUTANEOUS | Status: DC
  Administered 2018-02-20: 40 mg via SUBCUTANEOUS
  Filled 2018-02-20: qty 0.4

## 2018-02-20 MED ORDER — LORAZEPAM 2 MG/ML IJ SOLN
INTRAMUSCULAR | Status: AC
  Filled 2018-02-20: qty 1

## 2018-02-20 MED ORDER — QUETIAPINE FUMARATE 25 MG PO TABS: 100.0000 mg | ORAL_TABLET | Freq: Every day | ORAL | Status: DC

## 2018-02-20 MED ORDER — PNEUMOCOCCAL VAC POLYVALENT 25 MCG/0.5ML IJ INJ: 0.5000 mL | INJECTION | INTRAMUSCULAR | Status: DC

## 2018-02-20 MED ORDER — HYDROXYZINE HCL 25 MG PO TABS: 25.0000 mg | ORAL_TABLET | Freq: Three times a day (TID) | ORAL | Status: DC | PRN

## 2018-02-20 MED ORDER — ESCITALOPRAM OXALATE 10 MG PO TABS
20.0000 mg | ORAL_TABLET | Freq: Every day | ORAL | Status: DC
  Administered 2018-02-20: 20 mg via ORAL
  Filled 2018-02-20: qty 2

## 2018-02-20 MED ORDER — ACETAMINOPHEN 650 MG RE SUPP: 650.0000 mg | RECTAL | Status: DC | PRN

## 2018-02-20 MED ORDER — SENNOSIDES-DOCUSATE SODIUM 8.6-50 MG PO TABS: 1.0000 | ORAL_TABLET | Freq: Every evening | ORAL | Status: DC | PRN

## 2018-02-20 NOTE — ED Triage Notes (Signed)
Pt to the er via ems for seizures and apenic episodes. Pt was found in the floor by family not breathing, cpr performed. Pt is now breathing on her own. Pt admits to having 3 vodka drinks tonight. Denies illegal drugs. Pt states she took her meds.

## 2018-02-20 NOTE — Discharge Instructions (Signed)
Patient advised not to binge drink alcohol

## 2018-02-20 NOTE — ED Notes (Signed)
Pt resting quietly.

## 2018-02-20 NOTE — ED Notes (Signed)
ED Provider at bedside. 

## 2018-02-20 NOTE — H&P (Signed)
[facility] [place] NAME: Cheryl Flowers    MR#:  [id number]  DATE OF BIRTH:  12-26-1980  DATE OF ADMISSION:  02/20/2018  PRIMARY CARE PHYSICIAN: Volney American, PA-C   REQUESTING/REFERRING PHYSICIAN: Paulette Blanch, MD  CHIEF COMPLAINT:   Chief Complaint  Patient presents with  . Seizures    HISTORY OF PRESENT ILLNESS:  Cheryl Flowers  is a 37 y.o. female with a known history of obesity, HTN, tobacco use D/O, anx/dep who p/w 1d Hx LOC. Pt asleep, arousable but lethargic after receiving medication in ED. Per report received, pt was drinking vodka on Friday (02/19/2018) and lost consciousness. The narrative, specifically circumstances surrounding pt's syncopal episode, are not entirely clear. The pt apparently does not drink alcohol on a regular basis, and drinks perhaps 1x/mo. I am told pt was attempting to induce vomiting in the bathroom, and then lost consciousness. There was some questionable apnea, and apparently CPR was performed briefly. There was also a questionable account of seizure activity, for which pt received Versed 61m IM en route to ED, though pt has no Hx of epilepsy. Pt was apparently non-focal on arrival to ED, but I am unable to properly evaluate pt at the time of my Hx/examination due to lethargy, as noted above. No reports of F/C/N/D/AP, CP, SOB, palpitations, diaphoresis, rigors, night sweats, cough, hemoptysis, wheezing, HA, blurred vision, vertigo, LH, urinary symptoms, though ROS is unobtainable. CT head performed in ED demonstrates, "Ventricular dilatation of nonspecific etiology. Some loss of gray-white distinction which could be artifactual or may indicate early edema. Follow-up suggested as clinically indicated. No focal lesions identified. No mass effect or midline shift. Mild cerebellar tonsillar ectopia."  PAST MEDICAL HISTORY:   Past Medical History:  Diagnosis Date  . Anxiety   .  Depression   . Low back pain   . Marital conflict   . Panic disorder   . Thyromegaly     PAST SURGICAL HISTORY:   Past Surgical History:  Procedure Laterality Date  . CESAREAN SECTION  2003 and 2010  . CHOLECYSTECTOMY  2007  . TUBAL LIGATION  2010    SOCIAL HISTORY:   Social History   Tobacco Use  . Smoking status: Former Smoker    Types: Cigarettes  . Smokeless tobacco: Never Used  Substance Use Topics  . Alcohol use: Yes    Alcohol/week: 1.8 oz    Types: 3 Shots of liquor per week    FAMILY HISTORY:   Family History  Problem Relation Age of Onset  . Hypertension Mother     DRUG ALLERGIES:   Allergies  Allergen Reactions  . Morphine And Related Itching  . Sulfa Antibiotics Hives    REVIEW OF SYSTEMS:   Review of Systems  Unable to perform ROS: Patient unresponsive   ROS as per HPI, otherwise unobtainable.  MEDICATIONS AT HOME:   Prior to Admission medications   Medication Sig Start Date End Date Taking? Authorizing Provider  cyclobenzaprine (FLEXERIL) 10 MG tablet Take 1 tablet (10 mg total) by mouth 3 (three) times daily as needed for muscle spasms. 01/29/18   LVolney American PA-C  escitalopram (LEXAPRO) 20 MG tablet Take 1 tablet (20 mg total) by mouth daily. 10/30/17   LVolney American PA-C  hydrOXYzine (ATARAX/VISTARIL) 25 MG tablet Take 1 tablet (25 mg total) by mouth 3 (three) times daily as needed. 01/20/18   LVolney American PA-C  QUEtiapine (SEROQUEL) 100 MG tablet  Take 1 tablet (100 mg total) by mouth at bedtime. 01/29/18   Volney American, PA-C  traMADol (ULTRAM) 50 MG tablet Take 1 tablet (50 mg total) by mouth every 8 (eight) hours as needed. 01/29/18   Volney American, PA-C  triamterene-hydrochlorothiazide (MAXZIDE-25) 37.5-25 MG tablet Take 1 tablet by mouth daily. 10/30/17   Volney American, PA-C      VITAL SIGNS:  Blood pressure 112/73, pulse (!) 107, temperature 97.7 F (36.5 C), temperature source  Oral, resp. rate 16, height 5' 4"  (1.626 m), weight 95.3 kg (210 lb), SpO2 99 %.  PHYSICAL EXAMINATION:  Physical Exam  Constitutional: She appears well-developed and well-nourished. She appears lethargic. She is sleeping.  Non-toxic appearance. She does not have a sickly appearance. She does not appear ill. No distress.  HENT:  Head: Normocephalic and atraumatic.  Mouth/Throat: No oropharyngeal exudate.  Eyes: Conjunctivae and lids are normal. No scleral icterus.  Neck: Neck supple. No JVD present. No thyromegaly present.  Cardiovascular: Normal rate, regular rhythm, S1 normal, S2 normal and normal heart sounds.  No extrasystoles are present. Exam reveals no gallop, no S3, no S4, no distant heart sounds and no friction rub.  No murmur heard. Pulmonary/Chest: Effort normal and breath sounds normal. No stridor. No respiratory distress. She has no wheezes. She has no rhonchi. She has no rales.  Abdominal: Soft. Bowel sounds are normal. She exhibits no distension. There is no tenderness. There is no rebound and no guarding.  Musculoskeletal: She exhibits no edema.  Lymphadenopathy:    She has no cervical adenopathy.  Neurological: She appears lethargic.  Asleep. Arousable but lethargic. Unable to perform comprehensive neurological examination; pt reportedly non-focal on arrival to ED.  Skin: Skin is warm and dry. No rash noted. She is not diaphoretic. No erythema.  Psychiatric:  Asleep. Arousable but lethargic. Calm.   LABORATORY PANEL:   CBC Recent Labs  Lab 02/20/18 0138  WBC 9.1  HGB 13.2  HCT 38.6  PLT 334   ------------------------------------------------------------------------------------------------------------------  Chemistries  Recent Labs  Lab 02/20/18 0138  NA 138  K 3.6  CL 107  CO2 19*  GLUCOSE 101*  BUN 7  CREATININE 0.73  CALCIUM 8.9  AST 22  ALT 22  ALKPHOS 65  BILITOT 0.3    ------------------------------------------------------------------------------------------------------------------  Cardiac Enzymes Recent Labs  Lab 02/20/18 0138  TROPONINI <0.03   ------------------------------------------------------------------------------------------------------------------  RADIOLOGY:  Ct Head Wo Contrast  Result Date: 02/20/2018 CLINICAL DATA:  Seizures and apneic episodes. CPR. Alcohol use tonight. EXAM: CT HEAD WITHOUT CONTRAST TECHNIQUE: Contiguous axial images were obtained from the base of the skull through the vertex without intravenous contrast. COMPARISON:  None. FINDINGS: Brain: Mild ventricular dilatation. This is nonspecific but possibly congenital. There is some loss of gray-white distinction which may indicate early edema or could be due to image artifact. No significant mass effect or midline shift. Basal cisterns are not effaced. No acute intracranial hemorrhage. No abnormal extra-axial fluid collections. Mild cerebellar tonsillar ectopia. Vascular: No hyperdense vessel or unexpected calcification. Skull: Normal. Negative for fracture or focal lesion. Sinuses/Orbits: Retention cyst in the sphenoid sinus. Paranasal sinuses and mastoid air cells are otherwise clear. Other: None. IMPRESSION: Ventricular dilatation of nonspecific etiology. Some loss of gray-white distinction which could be artifactual or may indicate early edema. Follow-up suggested as clinically indicated. No focal lesions identified. No mass effect or midline shift. Mild cerebellar tonsillar ectopia. These results were called by telephone at the time of interpretation on 02/20/2018 at 2:49  am to Dr. Luvenia Starch SUNG , who verbally acknowledged these results. Electronically Signed   By: Lucienne Capers M.D.   On: 02/20/2018 02:47   Dg Chest Port 1 View  Result Date: 02/20/2018 CLINICAL DATA:  Seizures and apneic episodes. Patient was found on the floor by the family not breathing. CPR performed and  patient is now breathing on her own. Alcohol use tonight. EXAM: PORTABLE CHEST 1 VIEW COMPARISON:  01/20/2009 FINDINGS: Shallow inspiration. Heart size and pulmonary vascularity are normal. Lungs are clear. No blunting of costophrenic angles. No pneumothorax. Mediastinal contours appear intact. IMPRESSION: Shallow inspiration.  No evidence of active pulmonary disease. Electronically Signed   By: Lucienne Capers M.D.   On: 02/20/2018 02:07   IMPRESSION AND PLAN:   A/P: 1F LOC.  1.) LOC: Pt p/w 1d Hx LOC, as per HPI. There was questionable apnea, and CPR was reportedly performed. There was questionable convulsive type activity. The narrative is largely unclear. The pt has no Hx of seizures. Non-focal on arrival to ED. Narrative favors DDx of pseudoseizure vs. convulsive syncope vs. interaction between EtOH and home medication, over a diagnosis of tonic-clonic seizure. EtOH 97, CNS depressant, not likely to have Sz w/ EtOH consumption. Tylenol, Salicylate levels (-). UTox (+) benzodiazepines (ostensibly 2/2 Versed given by EMS), otherwise (-). CT head performed in ED (+), "Ventricular dilatation of nonspecific etiology. Some loss of gray-white distinction which could be artifactual or may indicate early edema. Follow-up suggested as clinically indicated. No focal lesions identified. No mass effect or midline shift. Mild cerebellar tonsillar ectopia." Pt admitted for MRI evaluation of CT head findings. Loaded with Keppra in ED, though I have elected to not continue antiepileptic medications. Hold Tramadol, lowers seizure threshold. Neuro checks, seizure precautions. Tele, cardiac monitoring.  2.) Hyperglycemia: Glucose 101. HbA1c pending.  3.) HTN: c/w Maxzide.  4.) Anx/dep: c/w Lexapro, Vistaril, Seroquel.  5.) FEN/GI: Cardiac diet.  6.) DVT PPx: Lovenox 55m SQ qD.  7.) Code status: Full code.  8.) Disposition: Observation, pt expected to stay < 2 midnights.   All the records are reviewed and  case discussed with ED provider. Management plans discussed with the patient, family and they are in agreement.  CODE STATUS: Full code.  TOTAL TIME TAKING CARE OF THIS PATIENT: 75 minutes.    PArta SilenceM.D on 02/20/2018 at 3:45 AM  Between 7am to 6pm - Pager - [phone]  After 6pm go to [email or web address] - password [email or web address] [facility] [place] [facility]  Office  [phone] CC: Primary care physician; LVolney American PA-C   Note: This dictation was prepared with Dragon dictation along with smaller phrase technology. Any transcriptional errors that result from this process are unintentional.

## 2018-02-20 NOTE — Progress Notes (Signed)
The patient is admitted to room 255 with the diagnosis of syncope. Alert and oriented x 4. Denied any acute pain at this moment. Full assessment to epic completed. Patient voiced no concerns. Will continue to monitor.

## 2018-02-20 NOTE — ED Notes (Addendum)
This RN spoke with family. Family states that pt and another person had drank a pint of vodka and pt normally only drinks once a month. Pt had also taken her nightly meds. Pt was in the bathroom attempting to vomit and female went back into bathroom and pt had passed out. Family started CPR and EMS continued it when they arrived. Pt had been c/o chest pain. No hx of seizures. Pt having intermittent tremors that appear to be conscious.

## 2018-02-20 NOTE — ED Provider Notes (Signed)
[facility] Emergency Department Provider Note   ____________________________________________   First MD Initiated Contact with Patient 02/20/18 0138     (approximate)  I have reviewed the triage vital signs and the nursing notes.   HISTORY  Chief Complaint Seizures  Level 5 caveat: Limited by intoxication  HPI Cheryl Flowers is a 37 y.o. female brought to the ED via EMS from home with a chief complaint of post cardiac arrest.  Reportedly patient was drinking alcohol this evening, she was found on the floor by her family.  She was reportedly apneic when first responders arrived and they performed a brief episode of CPR with return of spontaneous circulation.  There was also a report of seizures for which she received 5 mg IM Versed en route to the ED.  Patient does not have a history of seizure disorder.  Rest of history is limited secondary to patient's distress.   Past Medical History:  Diagnosis Date  . Anxiety   . Depression   . Low back pain   . Marital conflict   . Panic disorder   . Thyromegaly     Patient Active Problem List   Diagnosis Date Noted  . Obesity (BMI 35.0-39.9 without comorbidity) 02/01/2018  . Depression 11/02/2017  . Anxiety 11/02/2017  . Thyromegaly 11/02/2017    Past Surgical History:  Procedure Laterality Date  . CESAREAN SECTION  2003 and 2010  . CHOLECYSTECTOMY  2007  . TUBAL LIGATION  2010    Prior to Admission medications   Medication Sig Start Date End Date Taking? Authorizing Provider  cyclobenzaprine (FLEXERIL) 10 MG tablet Take 1 tablet (10 mg total) by mouth 3 (three) times daily as needed for muscle spasms. 01/29/18   Volney American, PA-C  escitalopram (LEXAPRO) 20 MG tablet Take 1 tablet (20 mg total) by mouth daily. 10/30/17   Volney American, PA-C  hydrOXYzine (ATARAX/VISTARIL) 25 MG tablet Take 1 tablet (25 mg total) by mouth 3 (three) times daily as needed. 01/20/18   Volney American, PA-C  QUEtiapine (SEROQUEL) 100 MG tablet Take 1 tablet (100 mg total) by mouth at bedtime. 01/29/18   Volney American, PA-C  traMADol (ULTRAM) 50 MG tablet Take 1 tablet (50 mg total) by mouth every 8 (eight) hours as needed. 01/29/18   Volney American, PA-C  triamterene-hydrochlorothiazide (MAXZIDE-25) 37.5-25 MG tablet Take 1 tablet by mouth daily. 10/30/17   Volney American, PA-C    Allergies Morphine and related and Sulfa antibiotics  Family History  Problem Relation Age of Onset  . Hypertension Mother     Social History Social History   Tobacco Use  . Smoking status: Former Smoker    Types: Cigarettes  . Smokeless tobacco: Never Used  Substance Use Topics  . Alcohol use: Yes    Alcohol/week: 1.8 oz    Types: 3 Shots of liquor per week  . Drug use: No    Review of Systems  Constitutional: No fever/chills. Eyes: No visual changes. ENT: No sore throat. Cardiovascular: Denies chest pain. Respiratory: Reportedly positive for apnea.  Denies shortness of breath. Gastrointestinal: No abdominal pain.  No nausea, no vomiting.  No diarrhea.  No constipation. Genitourinary: Negative for dysuria. Musculoskeletal: Negative for back pain. Skin: Negative for rash. Neurological: Reportedly positive for seizures.  Negative for headaches, focal weakness or numbness.  10 point review of systems limited secondary to patient's distress ____________________________________________   PHYSICAL EXAM:  VITAL SIGNS: ED Triage Vitals  Enc Vitals Group     BP 02/20/18 0222 112/73     Pulse Rate 02/20/18 0222 (!) 111     Resp 02/20/18 0153 20     Temp 02/20/18 0222 97.7 F (36.5 C)     Temp Source 02/20/18 0222 Oral     SpO2 02/20/18 0139 100 %     Weight 02/20/18 0154 210 lb (95.3 kg)     Height 02/20/18 0154 5' 4"  (1.626 m)     Head Circumference --      Peak Flow --      Pain Score 02/20/18 0154 10     Pain Loc --      Pain Edu? --      Excl. in [place]?  --     Constitutional: Awake on arrival. Well appearing and in mild acute distress. Eyes: Conjunctivae are bloodshot bilaterally. PERRL. EOMI. Head: Atraumatic. Nose: No deformity noted. Mouth/Throat: Mucous membranes are moist.  No clenched teeth.  No tongue laceration. Neck: No stridor.  No cervical spine tenderness to palpation. Cardiovascular: Tachycardic rate, regular rhythm. Grossly normal heart sounds.  Good peripheral circulation. Respiratory: Normal respiratory effort.  No retractions. Lungs CTAB. Gastrointestinal: Soft and nontender to light or deep palpation. No distention. No abdominal bruits. No CVA tenderness. Musculoskeletal: No lower extremity tenderness nor edema.  No joint effusions. Neurologic: Mildly intoxicated but otherwise awake and alert.  Normal speech and language. No gross focal neurologic deficits are appreciated. MAEx4.  Making shaking like motions with her limbs while awake.  Shaking stops on distraction.  When patient's arms are lifted above her head, she is able to keep her arms up without them dropping.  This is all during her shaking episode.   Skin:  Skin is warm, dry and intact. No rash noted. Psychiatric: Unable to assess. ____________________________________________   LABS (all labs ordered are listed, but only abnormal results are displayed)  Labs Reviewed  COMPREHENSIVE METABOLIC PANEL - Abnormal; Notable for the following components:      Result Value   CO2 19 (*)    Glucose, Bld 101 (*)    All other components within normal limits  ACETAMINOPHEN LEVEL - Abnormal; Notable for the following components:   Acetaminophen (Tylenol), Serum <10 (*)    All other components within normal limits  ETHANOL - Abnormal; Notable for the following components:   Alcohol, Ethyl (B) 97 (*)    All other components within normal limits  URINALYSIS, COMPLETE (UACMP) WITH MICROSCOPIC - Abnormal; Notable for the following components:   Color, Urine YELLOW (*)     APPearance CLEAR (*)    Hgb urine dipstick SMALL (*)    All other components within normal limits  URINE DRUG SCREEN, QUALITATIVE ([facility] ONLY) - Abnormal; Notable for the following components:   Benzodiazepine, Ur Scrn POSITIVE (*)    All other components within normal limits  BLOOD GAS, ARTERIAL - Abnormal; Notable for the following components:   pH, Arterial 7.59 (*)    pCO2 arterial 20 (*)    pO2, Arterial 272 (*)    Bicarbonate 19.2 (*)    All other components within normal limits  CBC WITH DIFFERENTIAL/PLATELET  SALICYLATE LEVEL  TROPONIN I  POCT PREGNANCY, URINE  POC URINE PREG, ED   ____________________________________________  EKG  ED ECG REPORT I, SUNG,JADE J, the attending physician, personally viewed and interpreted this ECG.   Date: 02/20/2018  EKG Time: 0143  Rate: 111  Rhythm: sinus tachycardia  Axis: Normal  Intervals:none  ST&T Change: Nonspecific  ____________________________________________  RADIOLOGY  ED MD interpretation: Chest x-ray without acute cardiopulmonary process  Official radiology report(s): Ct Head Wo Contrast  Result Date: 02/20/2018 CLINICAL DATA:  Seizures and apneic episodes. CPR. Alcohol use tonight. EXAM: CT HEAD WITHOUT CONTRAST TECHNIQUE: Contiguous axial images were obtained from the base of the skull through the vertex without intravenous contrast. COMPARISON:  None. FINDINGS: Brain: Mild ventricular dilatation. This is nonspecific but possibly congenital. There is some loss of gray-white distinction which may indicate early edema or could be due to image artifact. No significant mass effect or midline shift. Basal cisterns are not effaced. No acute intracranial hemorrhage. No abnormal extra-axial fluid collections. Mild cerebellar tonsillar ectopia. Vascular: No hyperdense vessel or unexpected calcification. Skull: Normal. Negative for fracture or focal lesion. Sinuses/Orbits: Retention cyst in the sphenoid sinus. Paranasal sinuses  and mastoid air cells are otherwise clear. Other: None. IMPRESSION: Ventricular dilatation of nonspecific etiology. Some loss of gray-white distinction which could be artifactual or may indicate early edema. Follow-up suggested as clinically indicated. No focal lesions identified. No mass effect or midline shift. Mild cerebellar tonsillar ectopia. These results were called by telephone at the time of interpretation on 02/20/2018 at 2:49 am to Dr. Lurline Hare , who verbally acknowledged these results. Electronically Signed   By: Lucienne Capers M.D.   On: 02/20/2018 02:47   Dg Chest Port 1 View  Result Date: 02/20/2018 CLINICAL DATA:  Seizures and apneic episodes. Patient was found on the floor by the family not breathing. CPR performed and patient is now breathing on her own. Alcohol use tonight. EXAM: PORTABLE CHEST 1 VIEW COMPARISON:  01/20/2009 FINDINGS: Shallow inspiration. Heart size and pulmonary vascularity are normal. Lungs are clear. No blunting of costophrenic angles. No pneumothorax. Mediastinal contours appear intact. IMPRESSION: Shallow inspiration.  No evidence of active pulmonary disease. Electronically Signed   By: Lucienne Capers M.D.   On: 02/20/2018 02:07    ____________________________________________   PROCEDURES  Procedure(s) performed: None  Procedures  Critical Care performed: Yes, see critical care note(s)   CRITICAL CARE Performed by: Paulette Blanch   Total critical care time: 45 minutes  Critical care time was exclusive of separately billable procedures and treating other patients.  Critical care was necessary to treat or prevent imminent or life-threatening deterioration.  Critical care was time spent personally by me on the following activities: development of treatment plan with patient and/or surrogate as well as nursing, discussions with consultants, evaluation of patient's response to treatment, examination of patient, obtaining history from patient or  surrogate, ordering and performing treatments and interventions, ordering and review of laboratory studies, ordering and review of radiographic studies, pulse oximetry and re-evaluation of patient's condition.  ____________________________________________   INITIAL IMPRESSION / ASSESSMENT AND PLAN / ED COURSE  As part of my medical decision making, I reviewed the following data within the [place] History obtained from family, Nursing notes reviewed and incorporated, Labs reviewed, EKG interpreted, Old chart reviewed, Radiograph reviewed and Notes from prior ED visits   37 year old female with a history of anxiety and depression who presents with intoxication, reported history of apnea with brief bystander CPR with seizure-like activity.  She arrives to the ED awake with return of spontaneous circulation and breathing on her own.  Shaking-like movement observed while she is awake and stops with distraction.  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  Initiate IV fluid resuscitation.  Will obtain screening  toxicological lab work and urine, CT head to evaluate for intracranial abnormalities, and reassess.  Clinical Course as of Feb 20 309  Sat Feb 20, 2018  0305 Discussed with radiologist Dr. Gerilyn Nestle regarding CT Head. Most likely artifact; can repeat CT in 8-12 hours, but given history of apnea requiring CPR, have discussed with hospitalist to evaluate patient in the ED for admission. Will cover her with 1g IV Keppra.   [JS]    Clinical Course User Index [JS] Paulette Blanch, MD     ____________________________________________   FINAL CLINICAL IMPRESSION(S) / ED DIAGNOSES  Final diagnoses:  Seizure-like activity ([place])  Apnea  Cardiac arrest [facility])     ED Discharge Orders    None       Note:  This document was prepared using Dragon voice recognition software and may include unintentional dictation errors.    Paulette Blanch, MD 02/20/18 [phone]

## 2018-02-20 NOTE — Progress Notes (Signed)
CIWA negative. Room air. NSR. Pt reports no pain. IV and tele removed. Discharge instructions given to pt. Friend to take pt home. Pt has no further concerns at this time.

## 2018-02-20 NOTE — Discharge Summary (Signed)
[place] [facility] [place] NAME: Cheryl Flowers    MR#:  [id number]  DATE OF BIRTH:  16-Aug-1981  DATE OF ADMISSION:  02/20/2018 ADMITTING PHYSICIAN: Amelia Jo, MD  DATE OF DISCHARGE: 02/20/2018  PRIMARY CARE PHYSICIAN: Volney American, PA-C    ADMISSION DIAGNOSIS:  Cardiac arrest ([place]) [I46.9] Apnea [R06.81] Seizure-like activity ([place]) [R56.9]  DISCHARGE DIAGNOSIS:  Syncope appears to be due to vasovagal response Alcohol intoxication  SECONDARY DIAGNOSIS:   Past Medical History:  Diagnosis Date  . Anxiety   . Depression   . Low back pain   . Marital conflict   . Panic disorder   . Thyromegaly     HOSPITAL COURSE:   Cheryl Flowers  is a 37 y.o. female with a known history of obesity, HTN, tobacco use D/O, anx/dep who p/w 1d Hx LOC. Pt asleep, arousable but lethargic after receiving medication in ED. Per report received, pt was drinking vodka on Friday (02/19/2018) and lost consciousness.   1.) Syncope/ transient LOC:  -patient presented with alcohol intoxication. She had an episode of vomiting/retching after she drank about a pint of vodka yesterday which she usually does not. She is a social drinker. Patient had passed out spell family got panicked and performed CPR. No seizures noted. Appears more of a shaky spell. Patient does not have any history of seizure disorder. -sHe feels a lot better today. Able to eat. No headache -remains in sinus rhythm on the telemarketer. Heart rate in the 90s. No chest pain.  2.) acute alcohol intoxication. Patient had a pint about a pint award call yesterday. She is otherwise a social drinker. She has never done this before. Advised patient not to binge drink since it did not suit her and had an episode of vomiting with possible vasovagal syncope. -Her CIWA score is negative. She is feeling okay. She is mildly tachycardic  3.) HTN: c/w Maxzide.  4.) Anx/dep: cont   Lexapro, Vistaril, Seroquel.  5.) DVT PPx: Lovenox 7m SQ qD.  Overall she feels better. If continues to improve she can go home later this afternoon. This was discussed with patient and her family in the room.  She will follow up with her primary care physician as outpatient  CONSULTS OBTAINED:  Treatment Team:  SArta Silence MD  DRUG ALLERGIES:   Allergies  Allergen Reactions  . Morphine And Related Itching  . Sulfa Antibiotics Hives    DISCHARGE MEDICATIONS:   Allergies as of 02/20/2018      Reactions   Morphine And Related Itching   Sulfa Antibiotics Hives      Medication List    STOP taking these medications   traMADol 50 MG tablet Commonly known as:  ULTRAM     TAKE these medications   cyclobenzaprine 10 MG tablet Commonly known as:  FLEXERIL Take 1 tablet (10 mg total) by mouth 3 (three) times daily as needed for muscle spasms.   escitalopram 20 MG tablet Commonly known as:  LEXAPRO Take 1 tablet (20 mg total) by mouth daily.   hydrOXYzine 25 MG tablet Commonly known as:  ATARAX/VISTARIL Take 1 tablet (25 mg total) by mouth 3 (three) times daily as needed.   QUEtiapine 100 MG tablet Commonly known as:  SEROQUEL Take 1 tablet (100 mg total) by mouth at bedtime.   triamterene-hydrochlorothiazide 37.5-25 MG tablet Commonly known as:  MAXZIDE-25 Take 1 tablet by mouth daily.   Vitamin D3 5000 units Tabs Take 1 tablet  by mouth daily.       If you experience worsening of your admission symptoms, develop shortness of breath, life threatening emergency, suicidal or homicidal thoughts you must seek medical attention immediately by calling 911 or calling your MD immediately  if symptoms less severe.  You Must read complete instructions/literature along with all the possible adverse reactions/side effects for all the Medicines you take and that have been prescribed to you. Take any new Medicines after you have completely understood and accept all  the possible adverse reactions/side effects.   Please note  You were cared for by a hospitalist during your hospital stay. If you have any questions about your discharge medications or the care you received while you were in the hospital after you are discharged, you can call the unit and asked to speak with the hospitalist on call if the hospitalist that took care of you is not available. Once you are discharged, your primary care physician will handle any further medical issues. Please note that NO REFILLS for any discharge medications will be authorized once you are discharged, as it is imperative that you return to your primary care physician (or establish a relationship with a primary care physician if you do not have one) for your aftercare needs so that they can reassess your need for medications and monitor your lab values. Today   SUBJECTIVE   It is better this morning. Ate breakfast. Family in the room  VITAL SIGNS:  Blood pressure 105/69, pulse 96, temperature 97.8 F (36.6 C), temperature source Oral, resp. rate 20, height 5' 4"  (1.626 m), weight 94.3 kg (207 lb 14.4 oz), SpO2 98 %.  I/O:    Intake/Output Summary (Last 24 hours) at 02/20/2018 1131 Last data filed at 02/20/2018 1008 Gross per 24 hour  Intake 425 ml  Output -  Net 425 ml    PHYSICAL EXAMINATION:  GENERAL:  37 y.o.-year-old patient lying in the bed with no acute distress.  EYES: Pupils equal, round, reactive to light and accommodation. No scleral icterus. Extraocular muscles intact.  HEENT: Head atraumatic, normocephalic. Oropharynx and nasopharynx clear.  NECK:  Supple, no jugular venous distention. No thyroid enlargement, no tenderness.  LUNGS: Normal breath sounds bilaterally, no wheezing, rales,rhonchi or crepitation. No use of accessory muscles of respiration.  CARDIOVASCULAR: S1, S2 normal. No murmurs, rubs, or gallops. Tachycardia ABDOMEN: Soft, non-tender, non-distended. Bowel sounds present. No  organomegaly or mass.  EXTREMITIES: No pedal edema, cyanosis, or clubbing.  NEUROLOGIC: Cranial nerves II through XII are intact. Muscle strength 5/5 in all extremities. Sensation intact. Gait not checked.  PSYCHIATRIC: The patient is alert and oriented x 3.  SKIN: No obvious rash, lesion, or ulcer.   DATA REVIEW:   CBC  Recent Labs  Lab 02/20/18 0138  WBC 9.1  HGB 13.2  HCT 38.6  PLT 334    Chemistries  Recent Labs  Lab 02/20/18 0138  NA 138  K 3.6  CL 107  CO2 19*  GLUCOSE 101*  BUN 7  CREATININE 0.73  CALCIUM 8.9  AST 22  ALT 22  ALKPHOS 65  BILITOT 0.3    Microbiology Results   No results found for this or any previous visit (from the past 240 hour(s)).  RADIOLOGY:  Ct Head Wo Contrast  Result Date: 02/20/2018 CLINICAL DATA:  Seizures and apneic episodes. CPR. Alcohol use tonight. EXAM: CT HEAD WITHOUT CONTRAST TECHNIQUE: Contiguous axial images were obtained from the base of the skull through the vertex without intravenous  contrast. COMPARISON:  None. FINDINGS: Brain: Mild ventricular dilatation. This is nonspecific but possibly congenital. There is some loss of gray-white distinction which may indicate early edema or could be due to image artifact. No significant mass effect or midline shift. Basal cisterns are not effaced. No acute intracranial hemorrhage. No abnormal extra-axial fluid collections. Mild cerebellar tonsillar ectopia. Vascular: No hyperdense vessel or unexpected calcification. Skull: Normal. Negative for fracture or focal lesion. Sinuses/Orbits: Retention cyst in the sphenoid sinus. Paranasal sinuses and mastoid air cells are otherwise clear. Other: None. IMPRESSION: Ventricular dilatation of nonspecific etiology. Some loss of gray-white distinction which could be artifactual or may indicate early edema. Follow-up suggested as clinically indicated. No focal lesions identified. No mass effect or midline shift. Mild cerebellar tonsillar ectopia. These  results were called by telephone at the time of interpretation on 02/20/2018 at 2:49 am to Dr. Lurline Hare , who verbally acknowledged these results. Electronically Signed   By: Lucienne Capers M.D.   On: 02/20/2018 02:47   Mr Brain Wo Contrast  Result Date: 02/20/2018 CLINICAL DATA:  Initial evaluation for new onset seizure. EXAM: MRI HEAD WITHOUT CONTRAST TECHNIQUE: Multiplanar, multiecho pulse sequences of the brain and surrounding structures were obtained without intravenous contrast. COMPARISON:  Prior CT from earlier the same day. FINDINGS: Brain: Cerebral volume within normal limits. No focal parenchymal signal abnormality identified. No cerebral white matter changes. No abnormal foci of restricted diffusion to suggest acute or subacute ischemia. Gray-white matter differentiation maintained. No areas of chronic infarction. No evidence for acute or chronic intracranial hemorrhage. No mass lesion, midline shift or mass effect. No intrinsic temporal lobe abnormality. No extra-axial fluid collection. Mild ventricular prominence without frank hydrocephalus, of uncertain significance, but may be within normal limits for patient. No transependymal flow of CSF. Incidental note made of a partially empty sella. Midline structures intact and normal. Vascular: Major intracranial vascular flow voids are maintained at the skull base. Skull and upper cervical spine: Mild cerebellar tonsillar ectopia of up to approximately 3 mm without frank Chiari malformation. Craniocervical junction otherwise unremarkable. Upper cervical spine normal. Bone marrow signal intensity diffusely decreased on T1 weighted imaging, most commonly related to anemia, smoking, or obesity. No scalp soft tissue abnormality. Sinuses/Orbits: Globes and orbital soft tissues within normal limits. Mild opacity noted within the right sphenoid sinus. Paranasal sinuses are otherwise clear. No mastoid effusion. Inner ear structures within normal limits. Other:  None. IMPRESSION: 1. No acute intracranial abnormality. 2. Mild cerebellar tonsillar ectopia without frank Chiari malformation. Electronically Signed   By: Jeannine Boga M.D.   On: 02/20/2018 06:14   Dg Chest Port 1 View  Result Date: 02/20/2018 CLINICAL DATA:  Seizures and apneic episodes. Patient was found on the floor by the family not breathing. CPR performed and patient is now breathing on her own. Alcohol use tonight. EXAM: PORTABLE CHEST 1 VIEW COMPARISON:  01/20/2009 FINDINGS: Shallow inspiration. Heart size and pulmonary vascularity are normal. Lungs are clear. No blunting of costophrenic angles. No pneumothorax. Mediastinal contours appear intact. IMPRESSION: Shallow inspiration.  No evidence of active pulmonary disease. Electronically Signed   By: Lucienne Capers M.D.   On: 02/20/2018 02:07     Management plans discussed with the patient, family and they are in agreement.  CODE STATUS:     Code Status Orders  (From admission, onward)        Start     Ordered   02/20/18 0517  Full code  Continuous     02/20/18  0516    Code Status History    This patient has a current code status but no historical code status.      TOTAL TIME TAKING CARE OF THIS PATIENT:40* minutes.    Fritzi Mandes M.D on 02/20/2018 at 11:31 AM  Between 7am to 6pm - Pager - [phone] After 6pm go to [email or web address] - password [email or web address] [facility]  Office  [phone]  CC: Primary care physician; Volney American, PA-C

## 2018-02-20 NOTE — ED Notes (Signed)
Patient transported to CT 

## 2018-02-22 ENCOUNTER — Telehealth: Payer: Self-pay

## 2018-02-22 LAB — HIV ANTIBODY (ROUTINE TESTING W REFLEX): HIV Screen 4th Generation wRfx: NONREACTIVE

## 2018-02-22 NOTE — Telephone Encounter (Signed)
I have made the 1st attempt to contact the patient or family member in charge, in order to follow up from recently being discharged from the hospital. I will make another attempt at a different time.

## 2018-02-26 ENCOUNTER — Encounter: Payer: Self-pay | Admitting: Family Medicine

## 2018-02-26 ENCOUNTER — Ambulatory Visit (INDEPENDENT_AMBULATORY_CARE_PROVIDER_SITE_OTHER): Payer: 59 | Admitting: Family Medicine

## 2018-02-26 ENCOUNTER — Other Ambulatory Visit: Payer: Self-pay | Admitting: Family Medicine

## 2018-02-26 VITALS — BP 117/80 | HR 101 | Temp 99.0°F | Ht 64.0 in | Wt 209.6 lb

## 2018-02-26 DIAGNOSIS — R55 Syncope and collapse: Secondary | ICD-10-CM | POA: Diagnosis not present

## 2018-02-26 DIAGNOSIS — F331 Major depressive disorder, recurrent, moderate: Secondary | ICD-10-CM

## 2018-02-26 DIAGNOSIS — F988 Other specified behavioral and emotional disorders with onset usually occurring in childhood and adolescence: Secondary | ICD-10-CM

## 2018-02-26 DIAGNOSIS — Z0001 Encounter for general adult medical examination with abnormal findings: Secondary | ICD-10-CM | POA: Diagnosis not present

## 2018-02-26 DIAGNOSIS — F419 Anxiety disorder, unspecified: Secondary | ICD-10-CM

## 2018-02-26 DIAGNOSIS — Z79899 Other long term (current) drug therapy: Secondary | ICD-10-CM | POA: Diagnosis not present

## 2018-02-26 DIAGNOSIS — I10 Essential (primary) hypertension: Secondary | ICD-10-CM | POA: Diagnosis not present

## 2018-02-26 DIAGNOSIS — Z111 Encounter for screening for respiratory tuberculosis: Secondary | ICD-10-CM

## 2018-02-26 DIAGNOSIS — Z0000 Encounter for general adult medical examination without abnormal findings: Secondary | ICD-10-CM

## 2018-02-26 LAB — UA/M W/RFLX CULTURE, ROUTINE
Bilirubin, UA: NEGATIVE
Glucose, UA: NEGATIVE
Ketones, UA: NEGATIVE
Leukocytes, UA: NEGATIVE
Nitrite, UA: NEGATIVE
Protein, UA: NEGATIVE
RBC, UA: NEGATIVE
Specific Gravity, UA: 1.01 (ref 1.005–1.030)
Urobilinogen, Ur: 0.2 mg/dL (ref 0.2–1.0)
pH, UA: 8.5 — ABNORMAL HIGH (ref 5.0–7.5)

## 2018-02-26 MED ORDER — AMPHETAMINE-DEXTROAMPHET ER 10 MG PO CP24: 10.0000 mg | ORAL_CAPSULE | Freq: Every day | ORAL | 0 refills | Status: DC

## 2018-02-26 NOTE — Progress Notes (Signed)
BP 117/80   Pulse (!) 101   Temp 99 F (37.2 C) (Oral)   Ht 5' 4"  (1.626 m)   Wt 209 lb 9.6 oz (95.1 kg)   LMP 02/08/2018 (Approximate)   SpO2 100%   BMI 35.98 kg/m    Subjective:    Patient ID: [name], female    DOB: 1981-02-25, 37 y.o.   MRN: [id number]  HPI: ANSLIE SPADAFORA is a 37 y.o. female presenting on 02/26/2018 for comprehensive medical examination. 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 Had varicella titer done 03/01/15 showing immunity. Has had all other required immunizations. Due for TB screening.  Taking 50 mg seroquel with good improvement in sleep. Still feels sluggish and having irritability. Having forgetfulness and focus issues as well. 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 Still doing fairly well on the lexapro and prn hydroxyzine. Denies SI/HI.   Recently hospitalized for alcohol intoxication and vasovagal syncope. States she never usually drinks like that, it was a one time thing and she over-did it. Feeling much better now that she's hydrated, no lingering issues or further syncope.   Depression Screen done today and results listed below:  Depression screen [facility] 2/9 02/26/2018 01/29/2018  Decreased Interest 1 2  Down, Depressed, Hopeless 0 0  PHQ - 2 Score 1 2  Altered sleeping 0 2  Tired, decreased energy 3 3  Change in appetite 0 0  Feeling bad or failure about yourself  0 1  Trouble concentrating 3 3  Moving slowly or fidgety/restless 0 1  Suicidal thoughts 0 0  PHQ-9 Score 7 12  Difficult doing work/chores - Very difficult    The patient does not have a history of falls. I did not complete a risk assessment for falls. A plan of care for falls was not documented.   Past Medical History:  Past Medical History:  Diagnosis Date  . Anxiety   . Depression   . Low  back pain   . Marital conflict   . Panic disorder   . Thyromegaly     Surgical History:  Past Surgical History:  Procedure Laterality Date  . CESAREAN SECTION  2003 and 2010  . CHOLECYSTECTOMY  2007  . TUBAL LIGATION  2010    Medications:  Current Outpatient Medications on File Prior to Visit  Medication Sig  . Cholecalciferol (VITAMIN D3) 5000 units TABS Take 1 tablet by mouth daily.  Marland Kitchen escitalopram (LEXAPRO) 20 MG tablet Take 1 tablet (20 mg total) by mouth daily.  . hydrOXYzine (ATARAX/VISTARIL) 25 MG tablet Take 1 tablet (25 mg total) by mouth 3 (three) times daily as needed.  Marland Kitchen QUEtiapine (SEROQUEL) 100 MG tablet Take 1 tablet (100 mg total) by mouth at bedtime.  . triamterene-hydrochlorothiazide (MAXZIDE-25) 37.5-25 MG tablet Take 1 tablet by mouth daily.   No current facility-administered medications on file prior to visit.     Allergies:  Allergies  Allergen Reactions  . Morphine And Related Itching  . Sulfa Antibiotics Hives    Social History:  Social History   Socioeconomic History  . Marital status: Married    Spouse name: Not on file  . Number of children: Not on file  . Years of education: Not on file  . Highest education level: Not on file  Occupational History  . Not on file  Social Needs  . Financial resource strain: Not on file  . Food insecurity:    Worry: Not on file    Inability: Not on file  . Transportation needs:    Medical: Not on file    Non-medical: Not on file  Tobacco Use  . Smoking status: Former Smoker    Types: Cigarettes  . Smokeless tobacco: Never Used  Substance and Sexual Activity  . Alcohol use: Yes    Alcohol/week: 1.8 oz    Types: 3 Shots of liquor per week  . Drug use: No  . Sexual activity: Not on file  Lifestyle  . Physical activity:    Days per week: Not on file    Minutes per session: Not on file  . Stress: Not on file  Relationships  . Social connections:    Talks on phone: Not on file    Gets together:  Not on file    Attends religious service: Not on file    Active member of club or organization: Not on file    Attends meetings of clubs or organizations: Not on file    Relationship status: Not on file  . Intimate partner violence:    Fear of current or ex partner: Not on file    Emotionally abused: Not on file    Physically abused: Not on file    Forced sexual activity: Not on file  Other Topics Concern  . Not on file  Social History Narrative  . Not on file   Social History   Tobacco Use  Smoking Status Former Smoker  . Types: Cigarettes  Smokeless Tobacco Never Used   Social History   Substance and Sexual Activity  Alcohol Use Yes  . Alcohol/week: 1.8 oz  . Types: 3 Shots of liquor per week    Family History:  Family History  Problem Relation Age of Onset  . Hypertension Mother     Past medical history, surgical history, medications, allergies, family history and social history reviewed with patient today and changes made to appropriate areas of the chart.   Review of Systems - General ROS: positive for  - fatigue Psychological ROS: positive for - anxiety, concentration difficulties and depression Ophthalmic ROS: negative ENT ROS: negative Allergy and Immunology ROS: negative Breast ROS: negative for breast lumps Respiratory ROS: no cough, shortness of breath, or wheezing Cardiovascular ROS: no chest pain or dyspnea on exertion Gastrointestinal ROS: no abdominal pain, change in bowel habits, or black or bloody stools Genito-Urinary ROS: no dysuria, trouble voiding, or hematuria Musculoskeletal ROS: negative Neurological ROS: no TIA or stroke symptoms Dermatological ROS: negative All other ROS negative except what is listed above and in the HPI.      Objective:    BP 117/80   Pulse (!) 101   Temp 99 F (37.2 C) (Oral)   Ht 5' 4"  (1.626 m)   Wt 209 lb 9.6 oz (95.1 kg)   LMP 02/08/2018 (Approximate)   SpO2 100%   BMI 35.98 kg/m   Wt Readings from  Last 3 Encounters:  02/26/18 209 lb 9.6 oz (95.1 kg)  02/20/18 207 lb 14.4 oz (94.3 kg)  01/29/18 211 lb (95.7 kg)    Physical Exam  Constitutional: She is oriented to person, place, and time. She appears well-developed and well-nourished. No distress.  HENT:  Head: Atraumatic.  Right Ear: External ear normal.  Left Ear: External ear normal.  Nose: Nose normal.  Mouth/Throat: Oropharynx is clear and moist. No oropharyngeal exudate.  Eyes: Pupils are equal, round, and reactive to light. Conjunctivae are normal. No scleral icterus.  Neck: Normal range of motion. Neck supple. No thyromegaly present.  Cardiovascular: Normal rate, regular rhythm, normal heart sounds and intact distal pulses.  Pulmonary/Chest: Effort normal and breath sounds normal. No respiratory distress.  Abdominal: Soft. Bowel sounds are normal. She exhibits no mass. There is no tenderness.  Musculoskeletal: Normal range of motion. She exhibits no edema or tenderness.  Lymphadenopathy:    She has no cervical adenopathy.  Neurological: She is alert and oriented to person, place, and time. No cranial nerve deficit.  Skin: Skin is warm and dry. No rash noted.  Psychiatric: She has a normal mood and affect. Her behavior is normal.  Nursing note and vitals reviewed.  Results for orders placed or performed in visit on 02/26/18  QuantiFERON-TB Gold Plus  Result Value Ref Range   QuantiFERON Incubation WILL FOLLOW    QuantiFERON Criteria WILL FOLLOW    QuantiFERON TB1 Ag Value WILL FOLLOW    QuantiFERON TB2 Ag Value WILL FOLLOW    QuantiFERON Nil Value WILL FOLLOW    QuantiFERON Mitogen Value WILL FOLLOW    QuantiFERON-TB Gold Plus WILL FOLLOW   CBC with Differential/Platelet  Result Value Ref Range   WBC 8.0 3.4 - 10.8 x10E3/uL   RBC 4.01 3.77 - 5.28 x10E6/uL   Hemoglobin 12.3 11.1 - 15.9 g/dL   Hematocrit 38.4 34.0 - 46.6 %   MCV 96 79 - 97 fL   MCH 30.7 26.6 - 33.0 pg   MCHC 32.0 31.5 - 35.7 g/dL   RDW 13.5 12.3  - 15.4 %   Platelets 347 150 - 379 x10E3/uL   Neutrophils 57 Not Estab. %   Lymphs 36 Not Estab. %   Monocytes 6 Not Estab. %   Eos 1 Not Estab. %   Basos 0 Not Estab. %   Neutrophils Absolute 4.6 1.4 - 7.0 x10E3/uL   Lymphocytes Absolute 2.9 0.7 - 3.1 x10E3/uL   Monocytes Absolute 0.5 0.1 - 0.9 x10E3/uL   EOS (ABSOLUTE) 0.1 0.0 - 0.4 x10E3/uL   Basophils Absolute 0.0 0.0 - 0.2 x10E3/uL   Immature Granulocytes 0 Not Estab. %   Immature Grans (Abs) 0.0 0.0 - 0.1 x10E3/uL  Comprehensive metabolic panel  Result Value Ref Range   Glucose 82 65 - 99 mg/dL   BUN 6 6 - 20 mg/dL   Creatinine, Ser 0.86 0.57 - 1.00 mg/dL   GFR calc non Af Amer 87 >59 mL/min/1.73   GFR calc Af Amer 101 >59 mL/min/1.73   BUN/Creatinine Ratio 7 (L) 9 - 23   Sodium 142 134 - 144 mmol/L   Potassium 4.1 3.5 - 5.2 mmol/L   Chloride 104 96 - 106 mmol/L   CO2 25 20 - 29 mmol/L   Calcium 9.4 8.7 - 10.2 mg/dL   Total Protein 7.1 6.0 - 8.5 g/dL   Albumin 4.1 3.5 - 5.5 g/dL   Globulin, Total 3.0 1.5 - 4.5 g/dL   Albumin/Globulin Ratio 1.4 1.2 - 2.2   Bilirubin Total 0.3 0.0 - 1.2 mg/dL   Alkaline Phosphatase 69 39 - 117 IU/L   AST 18 0 - 40 IU/L   ALT 25 0 - 32 IU/L  Lipid Panel w/o Chol/HDL Ratio  Result Value Ref Range   Cholesterol, Total 168 100 - 199 mg/dL   Triglycerides 62 0 - 149 mg/dL   HDL 54 >39 mg/dL   VLDL Cholesterol Cal 12 5 -  40 mg/dL   LDL Calculated 102 (H) 0 - 99 mg/dL  TSH  Result Value Ref Range   TSH 0.612 0.450 - 4.500 uIU/mL  UA/M w/rflx Culture, Routine  Result Value Ref Range   Specific Gravity, UA 1.010 1.005 - 1.030   pH, UA 8.5 (H) 5.0 - 7.5   Color, UA Yellow Yellow   Appearance Ur Clear Clear   Leukocytes, UA Negative Negative   Protein, UA Negative Negative/Trace   Glucose, UA Negative Negative   Ketones, UA Negative Negative   RBC, UA Negative Negative   Bilirubin, UA Negative Negative   Urobilinogen, Ur 0.2 0.2 - 1.0 mg/dL   Nitrite, UA Negative Negative        Assessment & Plan:   Problem List Items Addressed This Visit      Cardiovascular and Mediastinum   Syncope    Workup in hospital negative, was dehydrated from alcohol intoxication on arrival which is most likely cause. No recurrences since hydration and d/c.       Essential hypertension - Primary    Stable, continue current regimen        Other   Depression    Wanting to give the seroquel some more time. Continue lexapro. Discussed counseling, pt will consider.       Anxiety    Continue prn hydroxyzine      Attention deficit disorder (ADD) in adult    Long discussion about options. Pt wanting to try low dose adderall XR. Risks and side effects reviewed. Knows to watch for worsening anxiety and insomnia while on it. Will see back in 1 month for recheck. Controlled substance contract signed today.       Controlled substance agreement signed    Other Visit Diagnoses    Annual physical exam       Relevant Orders   CBC with Differential/Platelet (Completed)   Comprehensive metabolic panel (Completed)   Lipid Panel w/o Chol/HDL Ratio (Completed)   TSH (Completed)   UA/M w/rflx Culture, Routine (Completed)   Screening for tuberculosis       Relevant Orders   QuantiFERON-TB Gold Plus (Completed)       Follow up plan: Return in about 1 month (around 03/26/2018) for ADHD,mood f/u.   LABORATORY TESTING:  - Pap smear: up to date  IMMUNIZATIONS:   - Tdap: Tetanus vaccination status reviewed: last tetanus booster within 10 years. - Influenza: Postponed to flu season  PATIENT COUNSELING:   Advised to take 1 mg of folate supplement per day if capable of pregnancy.   Sexuality: Discussed sexually transmitted diseases, partner selection, use of condoms, avoidance of unintended pregnancy  and contraceptive alternatives.   Advised to avoid cigarette smoking.  I discussed with the patient that most people either abstain from alcohol or drink within safe limits (<=14/week and  <=4 drinks/occasion for males, <=7/weeks and <= 3 drinks/occasion for females) and that the risk for alcohol disorders and other health effects rises proportionally with the number of drinks per week and how often a drinker exceeds daily limits.  Discussed cessation/primary prevention of drug use and availability of treatment for abuse.   Diet: Encouraged to adjust caloric intake to maintain  or achieve ideal body weight, to reduce intake of dietary saturated fat and total fat, to limit sodium intake by avoiding high sodium foods and not adding table salt, and to maintain adequate dietary potassium and calcium preferably from fresh fruits, vegetables, and low-fat dairy products.    stressed the importance  of regular exercise  Injury prevention: Discussed safety belts, safety helmets, smoke detector, smoking near bedding or upholstery.   Dental health: Discussed importance of regular tooth brushing, flossing, and dental visits.    NEXT PREVENTATIVE PHYSICAL DUE IN 1 YEAR. Return in about 1 month (around 03/26/2018) for ADHD,mood f/u.

## 2018-02-27 LAB — COMPREHENSIVE METABOLIC PANEL
ALT: 25 IU/L (ref 0–32)
AST: 18 IU/L (ref 0–40)
Albumin/Globulin Ratio: 1.4 (ref 1.2–2.2)
Albumin: 4.1 g/dL (ref 3.5–5.5)
Alkaline Phosphatase: 69 IU/L (ref 39–117)
BUN/Creatinine Ratio: 7 — ABNORMAL LOW (ref 9–23)
BUN: 6 mg/dL (ref 6–20)
Bilirubin Total: 0.3 mg/dL (ref 0.0–1.2)
CO2: 25 mmol/L (ref 20–29)
Calcium: 9.4 mg/dL (ref 8.7–10.2)
Chloride: 104 mmol/L (ref 96–106)
Creatinine, Ser: 0.86 mg/dL (ref 0.57–1.00)
GFR calc Af Amer: 101 mL/min/{1.73_m2} (ref >59)
GFR calc non Af Amer: 87 mL/min/{1.73_m2} (ref >59)
Globulin, Total: 3 g/dL (ref 1.5–4.5)
Glucose: 82 mg/dL (ref 65–99)
Potassium: 4.1 mmol/L (ref 3.5–5.2)
Sodium: 142 mmol/L (ref 134–144)
Total Protein: 7.1 g/dL (ref 6.0–8.5)

## 2018-02-27 LAB — CBC WITH DIFFERENTIAL/PLATELET
Basophils Absolute: 0 10*3/uL (ref 0.0–0.2)
Basos: 0 %
EOS (ABSOLUTE): 0.1 10*3/uL (ref 0.0–0.4)
Eos: 1 %
Hematocrit: 38.4 % (ref 34.0–46.6)
Hemoglobin: 12.3 g/dL (ref 11.1–15.9)
Immature Grans (Abs): 0 10*3/uL (ref 0.0–0.1)
Immature Granulocytes: 0 %
Lymphocytes Absolute: 2.9 10*3/uL (ref 0.7–3.1)
Lymphs: 36 %
MCH: 30.7 pg (ref 26.6–33.0)
MCHC: 32 g/dL (ref 31.5–35.7)
MCV: 96 fL (ref 79–97)
Monocytes Absolute: 0.5 10*3/uL (ref 0.1–0.9)
Monocytes: 6 %
Neutrophils Absolute: 4.6 10*3/uL (ref 1.4–7.0)
Neutrophils: 57 %
Platelets: 347 10*3/uL (ref 150–379)
RBC: 4.01 x10E6/uL (ref 3.77–5.28)
RDW: 13.5 % (ref 12.3–15.4)
WBC: 8 10*3/uL (ref 3.4–10.8)

## 2018-02-27 LAB — LIPID PANEL W/O CHOL/HDL RATIO
Cholesterol, Total: 168 mg/dL (ref 100–199)
HDL: 54 mg/dL (ref >39)
LDL Calculated: 102 mg/dL — ABNORMAL HIGH (ref 0–99)
Triglycerides: 62 mg/dL (ref 0–149)
VLDL Cholesterol Cal: 12 mg/dL (ref 5–40)

## 2018-02-27 LAB — TSH: TSH: 0.612 u[IU]/mL (ref 0.450–4.500)

## 2018-03-01 DIAGNOSIS — Z79899 Other long term (current) drug therapy: Secondary | ICD-10-CM | POA: Insufficient documentation

## 2018-03-01 DIAGNOSIS — I10 Essential (primary) hypertension: Secondary | ICD-10-CM | POA: Insufficient documentation

## 2018-03-01 DIAGNOSIS — F988 Other specified behavioral and emotional disorders with onset usually occurring in childhood and adolescence: Secondary | ICD-10-CM | POA: Insufficient documentation

## 2018-03-01 NOTE — Assessment & Plan Note (Signed)
Wanting to give the seroquel some more time. Continue lexapro. Discussed counseling, pt will consider.

## 2018-03-01 NOTE — Assessment & Plan Note (Signed)
Long discussion about options. Pt wanting to try low dose adderall XR. Risks and side effects reviewed. Knows to watch for worsening anxiety and insomnia while on it. Will see back in 1 month for recheck. Controlled substance contract signed today.

## 2018-03-01 NOTE — Assessment & Plan Note (Signed)
Workup in hospital negative, was dehydrated from alcohol intoxication on arrival which is most likely cause. No recurrences since hydration and d/c.

## 2018-03-01 NOTE — Assessment & Plan Note (Signed)
Continue prn hydroxyzine

## 2018-03-01 NOTE — Patient Instructions (Signed)
Follow up in 1 month   

## 2018-03-01 NOTE — Assessment & Plan Note (Signed)
Stable, continue current regimen

## 2018-03-03 ENCOUNTER — Encounter: Payer: Self-pay | Admitting: Family Medicine

## 2018-03-03 LAB — QUANTIFERON-TB GOLD PLUS
QuantiFERON Mitogen Value: >10 IU/mL
QuantiFERON Nil Value: 0.07 IU/mL
QuantiFERON TB1 Ag Value: 0.06 [IU]/mL
QuantiFERON TB2 Ag Value: 0.04 [IU]/mL
QuantiFERON-TB Gold Plus: NEGATIVE

## 2018-03-03 NOTE — Telephone Encounter (Signed)
Pt. Called back and said Harmon Pier will pick up tomorrow after 2:00

## 2018-03-03 NOTE — Telephone Encounter (Signed)
Has FMLA been done?

## 2018-03-04 ENCOUNTER — Encounter: Payer: Self-pay | Admitting: Family Medicine

## 2018-03-04 ENCOUNTER — Ambulatory Visit (INDEPENDENT_AMBULATORY_CARE_PROVIDER_SITE_OTHER): Payer: Medicaid Other | Admitting: Family Medicine

## 2018-03-04 ENCOUNTER — Ambulatory Visit: Payer: Self-pay

## 2018-03-04 VITALS — BP 126/83 | HR 110 | Temp 98.6°F | Wt 211.5 lb

## 2018-03-04 DIAGNOSIS — M25511 Pain in right shoulder: Secondary | ICD-10-CM

## 2018-03-04 DIAGNOSIS — F411 Generalized anxiety disorder: Secondary | ICD-10-CM | POA: Diagnosis not present

## 2018-03-04 DIAGNOSIS — M25512 Pain in left shoulder: Secondary | ICD-10-CM

## 2018-03-04 DIAGNOSIS — F410 Panic disorder [episodic paroxysmal anxiety] without agoraphobia: Secondary | ICD-10-CM | POA: Diagnosis not present

## 2018-03-04 MED ORDER — LORAZEPAM 0.5 MG PO TABS: 0.5000 mg | ORAL_TABLET | Freq: Every day | ORAL | 0 refills | Status: DC | PRN

## 2018-03-04 MED ORDER — QUETIAPINE FUMARATE ER 50 MG PO TB24: 50.0000 mg | ORAL_TABLET | Freq: Every day | ORAL | 1 refills | Status: DC

## 2018-03-04 NOTE — Telephone Encounter (Signed)
Pt calling with c/o panic attack that occurred at 0645 this am. She has had issues with anxiety for the past 2 years, along with trouble focusing and trouble concentration. No suicidal or homicidal ideation. She has been feeling jittery and stated that her HR 109 and having occasional palpitations. She stated that she was having pain to the left side of her back in between the mid back and shoulder blade. She stated that it last 5 minutes has been intermittent and approximates she had 6 episodes since the end of April. Care advice given per protocol. Reached out to [facility] at practice [facility]) to help find pt an appt. No appts available with her PCP. Pt provided an appt 1:15 pm today with Dr Wynetta Emery.  Reason for Disposition . Patient sounds very upset or troubled to the triager  Answer Assessment - Initial Assessment Questions 1. CONCERN: "What happened that made you call today?"    Panic attack this am at 0645 2. ANXIETY SYMPTOM SCREENING: "Can you describe how you have been feeling?"  (e.g., tense, restless, panicky, anxious, keyed up, trouble sleeping, trouble concentrating)     Trouble concentrating, jittery,feels like HR 112. Anxious, panicky, fatigue 3. ONSET: "How long have you been feeling this way?"   Last 2 years but worse in the past  But in the past 2 months more frequent panic attacks 4. RECURRENT: "Have you felt this way before?"  If yes: "What happened that time?" "What helped these feelings go away in the past?"      Yes- meds and "waiting it out" 5. RISK OF HARM - SUICIDAL IDEATION:  "Do you ever have thoughts of hurting or killing yourself?"  (e.g., yes, no, no but preoccupation with thoughts about death)   - INTENT:  "Do you have thoughts of hurting or killing yourself right NOW?" (e.g., yes, no, N/A)   - PLAN: "Do you have a specific plan for how you would do this?" (e.g., gun, knife, overdose, no plan, N/A)     no 6. RISK OF HARM - HOMICIDAL IDEATION:  "Do you ever have thoughts  of hurting or killing someone else?"  (e.g., yes, no, no but preoccupation with thoughts about death)   - INTENT:  "Do you have thoughts of hurting or killing someone right NOW?" (e.g., yes, no, N/A)   - PLAN: "Do you have a specific plan for how you would do this?" (e.g., gun, knife, no plan, N/A)      no 7. FUNCTIONAL IMPAIRMENT: "How have things been going for you overall in your life? Have you had any more difficulties than usual doing your normal daily activities?"  (e.g., better, same, worse; self-care, school, work, Tree surgeon)     Work stressors was let go from job at the end of April- has been harder to do daily activities 8. SUPPORT: "Who is with you now?" "Who do you live with?" "Do you have family or friends nearby who you can talk to?"      Alone right now- husband and 2 children-talks to husband about it but no other people to talk to  81. THERAPIST: "Do you have a counselor or therapist? Name?"     No  10. STRESSORS: "Has there been any new stress or recent changes in your life?"       Let go from job and end of March IVC to behavioral health for 7 days- working through marital issues 11. CAFFEINE ABUSE: "Do you drink caffeinated beverages, and how much each day?" (  e.g., coffee, tea, colas)       no 12. SUBSTANCE ABUSE: "Do you use any illegal drugs or alcohol?"       Alcohol socially 13. OTHER SYMPTOMS: "Do you have any other physical symptoms right now?" (e.g., chest pain, palpitations, difficulty breathing, fever)       HR 109, palpitations, left side of back in between mid back and shoulder, lasts 5 minutes has had it 6 times since the end of April- comes and goes 14. PREGNANCY: "Is there any chance you are pregnant?" "When was your last menstrual period?"       No LMP: 02/10/18  Protocols used: ANXIETY AND PANIC ATTACK-A-AH

## 2018-03-04 NOTE — Patient Instructions (Signed)

## 2018-03-04 NOTE — Assessment & Plan Note (Signed)
Will continue lexapro and hydroxyzine. Ativan given- only to be taken with severe panic attacks, 20 pills should last several months. Will increase seroquel to XR and recheck 2 weeks. List of counselors given today. Will consider psychiatry appointment if not better at next appointment.

## 2018-03-04 NOTE — Progress Notes (Addendum)
BP 126/83 (BP Location: Right Arm, Patient Position: Sitting, Cuff Size: Normal)   Pulse (!) 110   Temp 98.6 F (37 C) (Oral)   Wt 211 lb 8 oz (95.9 kg)   LMP 02/08/2018 (Approximate)   SpO2 100%   BMI 36.30 kg/m    Subjective:    Patient ID: Cheryl Flowers, female    DOB: Aug 04, 1981, 37 y.o.   MRN: [id number]  HPI: Cheryl Flowers is a 37 y.o. female  Chief Complaint  Patient presents with  . Panic Attack    Patient stated she had one this morning. No triggers.   ANXIETY/STRESS- seen 6 days ago for a hospital follow up. Had been doing well on lexapro and hydroxyzine, also on seroquel. Patient recently started on low dose adderall for ADD, which she hadn't started yet. She has been taking 32m of seroquel a night. She notes that she has some pretty severe anxiety, had a bad panic attack this AM with her heart racing and throwing up. Feeling a little better now, but still very shakey. Having a very difficult time. Having panic attacks 2+ times a month- had been happening weekly before that Duration:better Anxious mood: yes  Excessive worrying: yes Irritability: yes  Sweating: yes Nausea: yes Palpitations:yes Hyperventilation: yes Panic attacks: yes Agoraphobia: yes  Obscessions/compulsions: yes Depressed mood: yes Depression screen [employer]2/9 03/04/2018 02/26/2018 01/29/2018  Decreased Interest 1 1 2   Down, Depressed, Hopeless 0 0 0  PHQ - 2 Score 1 1 2   Altered sleeping 0 0 2  Tired, decreased energy 2 3 3   Change in appetite 1 0 0  Feeling bad or failure about yourself  0 0 1  Trouble concentrating 3 3 3   Moving slowly or fidgety/restless 0 0 1  Suicidal thoughts 0 0 0  PHQ-9 Score 7 7 12   Difficult doing work/chores Somewhat difficult - Very difficult   GAD 7 : Generalized Anxiety Score 03/04/2018 01/29/2018  Nervous, Anxious, on Edge 1 0  Control/stop worrying 2 2  Worry too much - different things 2 2  Trouble relaxing 0 0  Restless 0 0  Easily  annoyed or irritable 1 1  Afraid - awful might happen 2 2  Total GAD 7 Score 8 7  Anxiety Difficulty Very difficult Very difficult   Anhedonia: no Weight changes: no Insomnia: no   Hypersomnia: no Fatigue/loss of energy: yes Feelings of worthlessness: no Feelings of guilt: no Impaired concentration/indecisiveness: yes Suicidal ideations: no  Crying spells: yes Recent Stressors/Life Changes: no   Relationship problems: no   Family stress: yes     Financial stress: yes    Job stress: yes    Recent death/loss: no  Having pain between her shoulder blades. Feels tight and pulling. Better with rest, worse with stress. No radition. Has been going on for the past couple of weeks. No other concerns.   Relevant past medical, surgical, family and social history reviewed and updated as indicated. Interim medical history since our last visit reviewed. Allergies and medications reviewed and updated.  Review of Systems  Constitutional: Negative.  Negative for activity change.  Respiratory: Positive for chest tightness and shortness of breath. Negative for apnea, cough, choking, wheezing and stridor.   Cardiovascular: Negative.   Gastrointestinal: Negative.   Skin: Negative.   Neurological: Negative.   Psychiatric/Behavioral: Positive for decreased concentration and dysphoric mood. Negative for agitation, behavioral problems, confusion, hallucinations, self-injury, sleep disturbance and suicidal ideas. The patient is nervous/anxious. The patient is  not hyperactive.     Per HPI unless specifically indicated above     Objective:    BP 126/83 (BP Location: Right Arm, Patient Position: Sitting, Cuff Size: Normal)   Pulse (!) 110   Temp 98.6 F (37 C) (Oral)   Wt 211 lb 8 oz (95.9 kg)   LMP 02/08/2018 (Approximate)   SpO2 100%   BMI 36.30 kg/m   Wt Readings from Last 3 Encounters:  03/04/18 211 lb 8 oz (95.9 kg)  02/26/18 209 lb 9.6 oz (95.1 kg)  02/20/18 207 lb 14.4 oz (94.3 kg)      Physical Exam  Constitutional: She is oriented to person, place, and time. She appears well-developed and well-nourished. No distress.  HENT:  Head: Normocephalic and atraumatic.  Right Ear: Hearing normal.  Left Ear: Hearing normal.  Nose: Nose normal.  Eyes: Conjunctivae and lids are normal. Right eye exhibits no discharge. Left eye exhibits no discharge. No scleral icterus.  Cardiovascular: Normal rate, regular rhythm, normal heart sounds and intact distal pulses. Exam reveals no gallop and no friction rub.  No murmur heard. Pulmonary/Chest: Effort normal and breath sounds normal. No stridor. No respiratory distress. She has no wheezes. She has no rales. She exhibits no tenderness.  Musculoskeletal: Normal range of motion.  Neurological: She is alert and oriented to person, place, and time.  Skin: Skin is warm, dry and intact. Capillary refill takes less than 2 seconds. No rash noted. She is not diaphoretic. No erythema. No pallor.  Psychiatric: Her speech is normal and behavior is normal. Judgment and thought content normal. Her mood appears anxious. Cognition and memory are normal.    Results for orders placed or performed in visit on 02/26/18  QuantiFERON-TB Gold Plus  Result Value Ref Range   QuantiFERON Incubation Incubation performed.    QuantiFERON Criteria Comment    QuantiFERON TB1 Ag Value 0.06 IU/mL   QuantiFERON TB2 Ag Value 0.04 IU/mL   QuantiFERON Nil Value 0.07 IU/mL   QuantiFERON Mitogen Value >10.00 IU/mL   QuantiFERON-TB Gold Plus Negative Negative  CBC with Differential/Platelet  Result Value Ref Range   WBC 8.0 3.4 - 10.8 x10E3/uL   RBC 4.01 3.77 - 5.28 x10E6/uL   Hemoglobin 12.3 11.1 - 15.9 g/dL   Hematocrit 38.4 34.0 - 46.6 %   MCV 96 79 - 97 fL   MCH 30.7 26.6 - 33.0 pg   MCHC 32.0 31.5 - 35.7 g/dL   RDW 13.5 12.3 - 15.4 %   Platelets 347 150 - 379 x10E3/uL   Neutrophils 57 Not Estab. %   Lymphs 36 Not Estab. %   Monocytes 6 Not Estab. %   Eos 1  Not Estab. %   Basos 0 Not Estab. %   Neutrophils Absolute 4.6 1.4 - 7.0 x10E3/uL   Lymphocytes Absolute 2.9 0.7 - 3.1 x10E3/uL   Monocytes Absolute 0.5 0.1 - 0.9 x10E3/uL   EOS (ABSOLUTE) 0.1 0.0 - 0.4 x10E3/uL   Basophils Absolute 0.0 0.0 - 0.2 x10E3/uL   Immature Granulocytes 0 Not Estab. %   Immature Grans (Abs) 0.0 0.0 - 0.1 x10E3/uL  Comprehensive metabolic panel  Result Value Ref Range   Glucose 82 65 - 99 mg/dL   BUN 6 6 - 20 mg/dL   Creatinine, Ser 0.86 0.57 - 1.00 mg/dL   GFR calc non Af Amer 87 >59 mL/min/1.73   GFR calc Af Amer 101 >59 mL/min/1.73   BUN/Creatinine Ratio 7 (L) 9 - 23   Sodium 142  134 - 144 mmol/L   Potassium 4.1 3.5 - 5.2 mmol/L   Chloride 104 96 - 106 mmol/L   CO2 25 20 - 29 mmol/L   Calcium 9.4 8.7 - 10.2 mg/dL   Total Protein 7.1 6.0 - 8.5 g/dL   Albumin 4.1 3.5 - 5.5 g/dL   Globulin, Total 3.0 1.5 - 4.5 g/dL   Albumin/Globulin Ratio 1.4 1.2 - 2.2   Bilirubin Total 0.3 0.0 - 1.2 mg/dL   Alkaline Phosphatase 69 39 - 117 IU/L   AST 18 0 - 40 IU/L   ALT 25 0 - 32 IU/L  Lipid Panel w/o Chol/HDL Ratio  Result Value Ref Range   Cholesterol, Total 168 100 - 199 mg/dL   Triglycerides 62 0 - 149 mg/dL   HDL 54 >39 mg/dL   VLDL Cholesterol Cal 12 5 - 40 mg/dL   LDL Calculated 102 (H) 0 - 99 mg/dL  TSH  Result Value Ref Range   TSH 0.612 0.450 - 4.500 uIU/mL  UA/M w/rflx Culture, Routine  Result Value Ref Range   Specific Gravity, UA 1.010 1.005 - 1.030   pH, UA 8.5 (H) 5.0 - 7.5   Color, UA Yellow Yellow   Appearance Ur Clear Clear   Leukocytes, UA Negative Negative   Protein, UA Negative Negative/Trace   Glucose, UA Negative Negative   Ketones, UA Negative Negative   RBC, UA Negative Negative   Bilirubin, UA Negative Negative   Urobilinogen, Ur 0.2 0.2 - 1.0 mg/dL   Nitrite, UA Negative Negative      Assessment & Plan:   Problem List Items Addressed This Visit      Other   Generalized anxiety disorder with panic attacks - Primary     Will continue lexapro and hydroxyzine. Ativan given- only to be taken with severe panic attacks, 20 pills should last several months. Will increase seroquel to XR and recheck 2 weeks. List of counselors given today. Will consider psychiatry appointment if not better at next appointment.       Relevant Medications   LORazepam (ATIVAN) 0.5 MG tablet    Other Visit Diagnoses    Acute pain of both shoulders       Seems to have muscle spasm. Will treat with streches. Call if not getting better or getting worse.        Follow up plan: Return in about 2 weeks (around 03/18/2018) for follow up mood.

## 2018-03-18 ENCOUNTER — Ambulatory Visit: Payer: Medicaid Other | Admitting: Family Medicine

## 2018-03-26 ENCOUNTER — Ambulatory Visit: Payer: 59 | Admitting: Family Medicine

## 2018-03-30 ENCOUNTER — Ambulatory Visit: Payer: Medicaid Other | Admitting: Family Medicine

## 2018-03-30 ENCOUNTER — Encounter: Payer: Self-pay | Admitting: Family Medicine

## 2018-03-30 VITALS — BP 117/81 | HR 96 | Temp 98.3°F | Wt 207.4 lb

## 2018-03-30 DIAGNOSIS — F410 Panic disorder [episodic paroxysmal anxiety] without agoraphobia: Secondary | ICD-10-CM

## 2018-03-30 DIAGNOSIS — F331 Major depressive disorder, recurrent, moderate: Secondary | ICD-10-CM

## 2018-03-30 DIAGNOSIS — F988 Other specified behavioral and emotional disorders with onset usually occurring in childhood and adolescence: Secondary | ICD-10-CM

## 2018-03-30 DIAGNOSIS — F411 Generalized anxiety disorder: Secondary | ICD-10-CM | POA: Diagnosis not present

## 2018-03-30 MED ORDER — ESCITALOPRAM OXALATE 10 MG PO TABS: 10.0000 mg | ORAL_TABLET | Freq: Every day | ORAL | 1 refills | Status: DC

## 2018-03-30 MED ORDER — HYDROXYZINE HCL 25 MG PO TABS: 25.0000 mg | ORAL_TABLET | Freq: Three times a day (TID) | ORAL | 6 refills | Status: DC | PRN

## 2018-03-30 MED ORDER — AMPHETAMINE-DEXTROAMPHET ER 15 MG PO CP24: 15.0000 mg | ORAL_CAPSULE | Freq: Every day | ORAL | 0 refills | Status: DC

## 2018-03-30 MED ORDER — QUETIAPINE FUMARATE ER 50 MG PO TB24: 50.0000 mg | ORAL_TABLET | Freq: Every day | ORAL | 1 refills | Status: DC

## 2018-03-30 NOTE — Assessment & Plan Note (Signed)
Doing so much better! Continue current regimen. Continue to monitor. Call with any concerns. Refills given.

## 2018-03-30 NOTE — Progress Notes (Signed)
BP 117/81 (BP Location: Left Arm, Patient Position: Sitting, Cuff Size: Large)   Pulse 96   Temp 98.3 F (36.8 C)   Wt 207 lb 7 oz (94.1 kg)   SpO2 100%   BMI 35.61 kg/m    Subjective:    Patient ID: Cheryl Flowers, female    DOB: [birth year], 37 y.o.   MRN: [id number]  HPI: Cheryl Flowers is a 37 y.o. female  Chief Complaint  Patient presents with  . Anxiety   ANXIETY/STRESS Duration:better Anxious mood: no  Excessive worrying: yes Irritability: no  Sweating: no Nausea: no Palpitations:no Hyperventilation: no Panic attacks: no Agoraphobia: no  Obscessions/compulsions: no Depressed mood: no Depression screen [facility] 2/9 03/30/2018 03/04/2018 02/26/2018 01/29/2018  Decreased Interest 0 1 1 2   Down, Depressed, Hopeless 0 0 0 0  PHQ - 2 Score 0 1 1 2   Altered sleeping 0 0 0 2  Tired, decreased energy 1 2 3 3   Change in appetite 2 1 0 0  Feeling bad or failure about yourself  0 0 0 1  Trouble concentrating 2 3 3 3   Moving slowly or fidgety/restless 0 0 0 1  Suicidal thoughts 0 0 0 0  PHQ-9 Score 5 7 7 12   Difficult doing work/chores Somewhat difficult Somewhat difficult - Very difficult   GAD 7 : Generalized Anxiety Score 03/30/2018 03/04/2018 01/29/2018  Nervous, Anxious, on Edge 0 1 0  Control/stop worrying 0 2 2  Worry too much - different things 1 2 2   Trouble relaxing 0 0 0  Restless 0 0 0  Easily annoyed or irritable 0 1 1  Afraid - awful might happen 0 2 2  Total GAD 7 Score 1 8 7   Anxiety Difficulty Somewhat difficult Very difficult Very difficult   Anhedonia: no Weight changes: no Insomnia: no   Hypersomnia: no Fatigue/loss of energy: yes Feelings of worthlessness: no Feelings of guilt: no Impaired concentration/indecisiveness: no Suicidal ideations: no  Crying spells: no Recent Stressors/Life Changes: no   Relationship problems: no   Family stress: no     Financial stress: no    Job stress: no    Recent death/loss: no  ADHD FOLLOW  UP ADHD status: better but not there. Satisfied with current therapy: no Medication compliance:  excellent compliance Controlled substance contract: yes Previous psychiatry evaluation: no Previous medications: no adderall xr   Taking meds on weekends/vacations: yes Work/school performance:  average Difficulty sustaining attention/completing tasks: yes Distracted by extraneous stimuli: yes Does not listen when spoken to: no  Fidgets with hands or feet: no Unable to stay in seat: no Blurts out/interrupts others: no ADHD Medication Side Effects: no    Decreased appetite: no    Headache: no    Sleeping disturbance pattern: no    Irritability: no    Rebound effects (worse than baseline) off medication: no    Anxiousness: no    Dizziness: no    Tics: no   Relevant past medical, surgical, family and social history reviewed and updated as indicated. Interim medical history since our last visit reviewed. Allergies and medications reviewed and updated.  Review of Systems  Constitutional: Negative.   Respiratory: Negative.   Cardiovascular: Negative.   Neurological: Negative.   Psychiatric/Behavioral: Positive for decreased concentration. Negative for agitation, behavioral problems, confusion, dysphoric mood, hallucinations, self-injury, sleep disturbance and suicidal ideas. The patient is not nervous/anxious and is not hyperactive.     Per HPI unless specifically indicated above  Objective:    BP 117/81 (BP Location: Left Arm, Patient Position: Sitting, Cuff Size: Large)   Pulse 96   Temp 98.3 F (36.8 C)   Wt 207 lb 7 oz (94.1 kg)   SpO2 100%   BMI 35.61 kg/m   Wt Readings from Last 3 Encounters:  03/30/18 207 lb 7 oz (94.1 kg)  03/04/18 211 lb 8 oz (95.9 kg)  02/26/18 209 lb 9.6 oz (95.1 kg)    Physical Exam  Constitutional: She is oriented to person, place, and time. She appears well-developed and well-nourished. No distress.  HENT:  Head: Normocephalic and  atraumatic.  Right Ear: Hearing normal.  Left Ear: Hearing normal.  Nose: Nose normal.  Eyes: Conjunctivae and lids are normal. Right eye exhibits no discharge. Left eye exhibits no discharge. No scleral icterus.  Cardiovascular: Normal rate, regular rhythm, normal heart sounds and intact distal pulses. Exam reveals no gallop and no friction rub.  No murmur heard. Pulmonary/Chest: Effort normal and breath sounds normal. No stridor. No respiratory distress. She has no wheezes. She has no rales. She exhibits no tenderness.  Musculoskeletal: Normal range of motion.  Neurological: She is alert and oriented to person, place, and time.  Skin: Skin is warm, dry and intact. Capillary refill takes less than 2 seconds. No rash noted. She is not diaphoretic. No erythema. No pallor.  Psychiatric: She has a normal mood and affect. Her speech is normal and behavior is normal. Judgment and thought content normal. Cognition and memory are normal.  Nursing note and vitals reviewed.   Results for orders placed or performed in visit on 02/26/18  QuantiFERON-TB Gold Plus  Result Value Ref Range   QuantiFERON Incubation Incubation performed.    QuantiFERON Criteria Comment    QuantiFERON TB1 Ag Value 0.06 IU/mL   QuantiFERON TB2 Ag Value 0.04 IU/mL   QuantiFERON Nil Value 0.07 IU/mL   QuantiFERON Mitogen Value >10.00 IU/mL   QuantiFERON-TB Gold Plus Negative Negative  CBC with Differential/Platelet  Result Value Ref Range   WBC 8.0 3.4 - 10.8 x10E3/uL   RBC 4.01 3.77 - 5.28 x10E6/uL   Hemoglobin 12.3 11.1 - 15.9 g/dL   Hematocrit 38.4 34.0 - 46.6 %   MCV 96 79 - 97 fL   MCH 30.7 26.6 - 33.0 pg   MCHC 32.0 31.5 - 35.7 g/dL   RDW 13.5 12.3 - 15.4 %   Platelets 347 150 - 379 x10E3/uL   Neutrophils 57 Not Estab. %   Lymphs 36 Not Estab. %   Monocytes 6 Not Estab. %   Eos 1 Not Estab. %   Basos 0 Not Estab. %   Neutrophils Absolute 4.6 1.4 - 7.0 x10E3/uL   Lymphocytes Absolute 2.9 0.7 - 3.1 x10E3/uL     Monocytes Absolute 0.5 0.1 - 0.9 x10E3/uL   EOS (ABSOLUTE) 0.1 0.0 - 0.4 x10E3/uL   Basophils Absolute 0.0 0.0 - 0.2 x10E3/uL   Immature Granulocytes 0 Not Estab. %   Immature Grans (Abs) 0.0 0.0 - 0.1 x10E3/uL  Comprehensive metabolic panel  Result Value Ref Range   Glucose 82 65 - 99 mg/dL   BUN 6 6 - 20 mg/dL   Creatinine, Ser 0.86 0.57 - 1.00 mg/dL   GFR calc non Af Amer 87 >59 mL/min/1.73   GFR calc Af Amer 101 >59 mL/min/1.73   BUN/Creatinine Ratio 7 (L) 9 - 23   Sodium 142 134 - 144 mmol/L   Potassium 4.1 3.5 - 5.2 mmol/L   Chloride  104 96 - 106 mmol/L   CO2 25 20 - 29 mmol/L   Calcium 9.4 8.7 - 10.2 mg/dL   Total Protein 7.1 6.0 - 8.5 g/dL   Albumin 4.1 3.5 - 5.5 g/dL   Globulin, Total 3.0 1.5 - 4.5 g/dL   Albumin/Globulin Ratio 1.4 1.2 - 2.2   Bilirubin Total 0.3 0.0 - 1.2 mg/dL   Alkaline Phosphatase 69 39 - 117 IU/L   AST 18 0 - 40 IU/L   ALT 25 0 - 32 IU/L  Lipid Panel w/o Chol/HDL Ratio  Result Value Ref Range   Cholesterol, Total 168 100 - 199 mg/dL   Triglycerides 62 0 - 149 mg/dL   HDL 54 >39 mg/dL   VLDL Cholesterol Cal 12 5 - 40 mg/dL   LDL Calculated 102 (H) 0 - 99 mg/dL  TSH  Result Value Ref Range   TSH 0.612 0.450 - 4.500 uIU/mL  UA/M w/rflx Culture, Routine  Result Value Ref Range   Specific Gravity, UA 1.010 1.005 - 1.030   pH, UA 8.5 (H) 5.0 - 7.5   Color, UA Yellow Yellow   Appearance Ur Clear Clear   Leukocytes, UA Negative Negative   Protein, UA Negative Negative/Trace   Glucose, UA Negative Negative   Ketones, UA Negative Negative   RBC, UA Negative Negative   Bilirubin, UA Negative Negative   Urobilinogen, Ur 0.2 0.2 - 1.0 mg/dL   Nitrite, UA Negative Negative      Assessment & Plan:   Problem List Items Addressed This Visit      Other   Depression    Doing so much better! Continue current regimen. Continue to monitor. Call with any concerns. Refills given.       Relevant Medications   escitalopram (LEXAPRO) 10 MG tablet    hydrOXYzine (ATARAX/VISTARIL) 25 MG tablet   Generalized anxiety disorder with panic attacks - Primary    Doing so much better! Continue current regimen. Continue to monitor. Call with any concerns. Refills given.       Relevant Medications   escitalopram (LEXAPRO) 10 MG tablet   hydrOXYzine (ATARAX/VISTARIL) 25 MG tablet   Attention deficit disorder (ADD) in adult    Doing better, but not under good control. Will increase to 26m daily and recheck 1 month. Call with any concerns.           Follow up plan: Return in about 1 month (around 04/27/2018).

## 2018-03-30 NOTE — Assessment & Plan Note (Signed)
Doing better, but not under good control. Will increase to 16m daily and recheck 1 month. Call with any concerns.

## 2018-04-12 ENCOUNTER — Encounter: Payer: Self-pay | Admitting: Family Medicine

## 2018-04-20 ENCOUNTER — Other Ambulatory Visit: Payer: Self-pay | Admitting: Family Medicine

## 2018-04-20 ENCOUNTER — Encounter: Payer: Self-pay | Admitting: Family Medicine

## 2018-04-20 DIAGNOSIS — Z021 Encounter for pre-employment examination: Secondary | ICD-10-CM

## 2018-04-22 ENCOUNTER — Telehealth: Payer: Self-pay | Admitting: Family Medicine

## 2018-04-22 ENCOUNTER — Other Ambulatory Visit: Payer: Medicaid Other

## 2018-04-22 DIAGNOSIS — Z021 Encounter for pre-employment examination: Secondary | ICD-10-CM

## 2018-04-22 NOTE — Telephone Encounter (Signed)
Refill request for flexeril / Per Epic, patient reported not taking on 03-04-18 / Is refill appropriate?

## 2018-04-22 NOTE — Telephone Encounter (Signed)
Placed a form in Dr Durenda Age box to be filled out  Just FYI  Thank you

## 2018-04-24 LAB — MEASLES/MUMPS/RUBELLA IMMUNITY
MUMPS ABS, IGG: 15.9 AU/mL (ref >10.9)
RUBEOLA AB, IGG: 38.2 AU/mL (ref >29.9)
Rubella Antibodies, IGG: 1.59 {index} (ref >0.99)

## 2018-04-24 LAB — VARICELLA ZOSTER ABS, IGG/IGM
Varicella IgM: <0.91 index (ref 0.00–0.90)
Varicella zoster IgG: 1944 {index} (ref >165)

## 2018-04-26 ENCOUNTER — Other Ambulatory Visit: Payer: Self-pay | Admitting: Family Medicine

## 2018-04-26 MED ORDER — AMPHETAMINE-DEXTROAMPHET ER 15 MG PO CP24: 15.0000 mg | ORAL_CAPSULE | Freq: Every day | ORAL | 0 refills | Status: DC

## 2018-05-02 ENCOUNTER — Encounter: Payer: Self-pay | Admitting: Family Medicine

## 2018-05-03 ENCOUNTER — Other Ambulatory Visit: Payer: Self-pay | Admitting: Family Medicine

## 2018-05-03 MED ORDER — AMPHETAMINE-DEXTROAMPHET ER 15 MG PO CP24: 15.0000 mg | ORAL_CAPSULE | Freq: Every day | ORAL | 0 refills | Status: DC

## 2018-05-03 NOTE — Telephone Encounter (Signed)
I faxed it but forgot that they won't accept it that way. I have sent Cheryl Flowers a message to send it to the pharmacy electronically.

## 2018-05-07 ENCOUNTER — Ambulatory Visit: Payer: Medicaid Other | Admitting: Family Medicine

## 2018-05-12 ENCOUNTER — Encounter: Payer: Self-pay | Admitting: Family Medicine

## 2018-05-12 ENCOUNTER — Ambulatory Visit (INDEPENDENT_AMBULATORY_CARE_PROVIDER_SITE_OTHER): Payer: Medicaid Other | Admitting: Family Medicine

## 2018-05-12 VITALS — BP 118/79 | HR 98 | Temp 98.5°F | Ht 64.0 in | Wt 206.0 lb

## 2018-05-12 DIAGNOSIS — M25531 Pain in right wrist: Secondary | ICD-10-CM

## 2018-05-12 DIAGNOSIS — Z720 Tobacco use: Secondary | ICD-10-CM | POA: Diagnosis not present

## 2018-05-12 DIAGNOSIS — Z23 Encounter for immunization: Secondary | ICD-10-CM

## 2018-05-12 DIAGNOSIS — F411 Generalized anxiety disorder: Secondary | ICD-10-CM

## 2018-05-12 DIAGNOSIS — M25532 Pain in left wrist: Secondary | ICD-10-CM

## 2018-05-12 DIAGNOSIS — F410 Panic disorder [episodic paroxysmal anxiety] without agoraphobia: Secondary | ICD-10-CM

## 2018-05-12 HISTORY — DX: Tobacco use: Z72.0

## 2018-05-12 MED ORDER — VARENICLINE TARTRATE 1 MG PO TABS: 1.0000 mg | ORAL_TABLET | Freq: Two times a day (BID) | ORAL | 2 refills | Status: DC

## 2018-05-12 MED ORDER — VARENICLINE TARTRATE 0.5 MG X 11 & 1 MG X 42 PO MISC: ORAL | 0 refills | Status: DC

## 2018-05-12 NOTE — Progress Notes (Signed)
BP 118/79 (BP Location: Left Arm, Patient Position: Sitting, Cuff Size: Large)   Pulse 98   Temp 98.5 F (36.9 C)   Ht 5' 4"  (1.626 m)   Wt 206 lb (93.4 kg)   SpO2 98%   BMI 35.36 kg/m    Subjective:    Patient ID: [name], female    DOB: 11-Jun-1981, 37 y.o.   MRN: [id number]  HPI: CIAIRA NATIVIDAD is a 37 y.o. female  Chief Complaint  Patient presents with  . Depression  . Anxiety  . Nicotine Dependence   ANXIETY/STRESS- has filed for divorce since last time. She has been doing pretty well.  Duration:controlled Anxious mood: yes  Excessive worrying: no Irritability: no  Sweating: no Nausea: no Palpitations:yes Hyperventilation: no Panic attacks: no Agoraphobia: no  Obscessions/compulsions: no Depressed mood: no Depression screen [facility] 2/9 05/12/2018 03/30/2018 03/04/2018 02/26/2018 01/29/2018  Decreased Interest 0 0 1 1 2   Down, Depressed, Hopeless 0 0 0 0 0  PHQ - 2 Score 0 0 1 1 2   Altered sleeping 0 0 0 0 2  Tired, decreased energy 0 1 2 3 3   Change in appetite 0 2 1 0 0  Feeling bad or failure about yourself  0 0 0 0 1  Trouble concentrating 1 2 3 3 3   Moving slowly or fidgety/restless 0 0 0 0 1  Suicidal thoughts 0 0 0 0 0  PHQ-9 Score 1 5 7 7 12   Difficult doing work/chores Not difficult at all Somewhat difficult Somewhat difficult - Very difficult   Anhedonia: no Weight changes: no Insomnia: no   Hypersomnia: no Fatigue/loss of energy: yes Feelings of worthlessness: no Feelings of guilt: no Impaired concentration/indecisiveness: no Suicidal ideations: no  Crying spells: no Recent Stressors/Life Changes: yes   Relationship problems: yes   Family stress: yes     Financial stress: yes    Job stress: no    Recent death/loss: no  SMOKING CESSATION Smoking Status: Current every day smoker Smoking Amount: 1ppd Smoking Onset: 1 month Smoking Quit Date: ASAP Smoking triggers: stress Type of tobacco use: cigarettes Other household  members who smoke: yes Treatments attempted: patch and chantix Pneumovax: Given today  Has been having some pains in her wrists when she's on her period, kind of achey. Otherwise feeling well with no other concerns or complaints at this time.   Relevant past medical, surgical, family and social history reviewed and updated as indicated. Interim medical history since our last visit reviewed. Allergies and medications reviewed and updated.  Review of Systems  Constitutional: Negative.   Respiratory: Negative.   Cardiovascular: Negative.   Musculoskeletal: Positive for arthralgias. Negative for back pain, gait problem, joint swelling, myalgias, neck pain and neck stiffness.  Skin: Negative.   Psychiatric/Behavioral: Negative.     Per HPI unless specifically indicated above     Objective:    BP 118/79 (BP Location: Left Arm, Patient Position: Sitting, Cuff Size: Large)   Pulse 98   Temp 98.5 F (36.9 C)   Ht 5' 4"  (1.626 m)   Wt 206 lb (93.4 kg)   SpO2 98%   BMI 35.36 kg/m   Wt Readings from Last 3 Encounters:  05/12/18 206 lb (93.4 kg)  03/30/18 207 lb 7 oz (94.1 kg)  03/04/18 211 lb 8 oz (95.9 kg)    Physical Exam  Constitutional: She is oriented to person, place, and time. She appears well-developed and well-nourished. No distress.  HENT:  Head: Normocephalic  and atraumatic.  Right Ear: Hearing normal.  Left Ear: Hearing normal.  Nose: Nose normal.  Eyes: Conjunctivae and lids are normal. Right eye exhibits no discharge. Left eye exhibits no discharge. No scleral icterus.  Cardiovascular: Normal rate, regular rhythm, normal heart sounds and intact distal pulses. Exam reveals no gallop and no friction rub.  No murmur heard. Pulmonary/Chest: Effort normal and breath sounds normal. No stridor. No respiratory distress. She has no wheezes. She has no rales. She exhibits no tenderness.  Musculoskeletal: Normal range of motion.  Neurological: She is alert and oriented to  person, place, and time.  Skin: Skin is warm, dry and intact. Capillary refill takes less than 2 seconds. No rash noted. She is not diaphoretic. No erythema. No pallor.  Psychiatric: She has a normal mood and affect. Her speech is normal and behavior is normal. Judgment and thought content normal. Cognition and memory are normal.  Nursing note and vitals reviewed.   Results for orders placed or performed in visit on 04/22/18  Varicella Zoster Abs, IgG/IgM  Result Value Ref Range   Varicella zoster IgG 1,944 Immune >165 index   Varicella IgM <0.91 0.00 - 0.90 index  Measles/Mumps/Rubella Immunity  Result Value Ref Range   Rubella Antibodies, IGG 1.59 Immune >0.99 index   RUBEOLA AB, IGG 38.2 Immune >29.9 AU/mL   MUMPS ABS, IGG 15.9 Immune >10.9 AU/mL      Assessment & Plan:   Problem List Items Addressed This Visit      Other   Generalized anxiety disorder with panic attacks - Primary    Under good control. Doing much better. Call with any concerns. Follow up ADD in September.      Tobacco abuse    Restarted smoking. Will start chantix. Call with any concerns. Continue to monitor. Recheck 6 weeks.        Other Visit Diagnoses    Pain in both wrists       Will start stretches, Call with any concerns or if not getting better.    Immunization due       Pneumovax given today   Relevant Orders   Pneumococcal polysaccharide vaccine 23-valent greater than or equal to 2yo subcutaneous/IM (Completed)       Follow up plan: Return September, for Follow up ADD.

## 2018-05-12 NOTE — Assessment & Plan Note (Signed)
Restarted smoking. Will start chantix. Call with any concerns. Continue to monitor. Recheck 6 weeks.

## 2018-05-12 NOTE — Patient Instructions (Signed)
Wrist and Forearm Exercises Ask your health care provider which exercises are safe for you. Do exercises exactly as told by your health care provider and adjust them as directed. It is normal to feel mild stretching, pulling, tightness, or discomfort as you do these exercises, but you should stop right away if you feel sudden pain or your pain gets worse. Do not begin these exercises until told by your health care provider. RANGE OF MOTION EXERCISES These exercises warm up your muscles and joints and improve the movement and flexibility of your injured wrist and forearm. These exercises also help to relieve pain, numbness, and tingling. These exercises are done using the muscles in your injured wrist and forearm. Exercise A: Wrist Flexion, Active 1. With your fingers relaxed, bend your wrist forward as far as you can. 2. Hold this position for __________ seconds. Repeat __________ times. Complete this exercise __________ times a day. Exercise B: Wrist Extension, Active 1. With your fingers relaxed, bend your wrist backward as far as you can. 2. Hold this position for __________ seconds. Repeat __________ times. Complete this exercise __________ times a day. Exercise C: Supination, Active  1. Stand or sit with your arms at your sides. 2. Bend your left / right elbow to an "L" shape (90 degrees). 3. Turn your palm upward until you feel a gentle stretch on the inside of your forearm. 4. Hold this position for __________ seconds. 5. Slowly return your palm to the starting position. Repeat __________ times. Complete this exercise __________ times a day. Exercise D: Pronation, Active  1. Stand or sit with your arms at your sides. 2. Bend your left / right elbow to an "L" shape (90 degrees). 3. Turn your palm downward until you feel a gentle stretch on the top of your forearm. 4. Hold this position for __________ seconds. 5. Slowly return your palm to the starting position. Repeat __________  times. Complete this exercise __________ times a day. STRETCHING EXERCISES These exercises warm up your muscles and joints and improve the movement and flexibility of your injured wrist and forearm. These exercises also help to relieve pain, numbness, and tingling. These exercises are done using your healthy wrist and forearm to help stretch the muscles in your injured wrist and forearm. Exercise E: Wrist Flexion, Passive  1. Extend your left / right arm in front of you, relax your wrist, and point your fingers downward. 2. Gently push on the back of your hand. Stop when you feel a gentle stretch on the top of your forearm. 3. Hold this position for __________ seconds. Repeat __________ times. Complete this exercise __________ times a day. Exercise F: Wrist Extension, Passive  1. Extend your left / right arm in front of you and turn your palm upward. 2. Gently pull your palm and fingertips back so your fingers point downward. You should feel a gentle stretch on the palm-side of your forearm. 3. Hold this position for __________ seconds. Repeat __________ times. Complete this exercise __________ times a day. Exercise G: Forearm Rotation, Supination, Passive 1. Sit with your left / right elbow bent to an "L" shape (90 degrees) with your forearm resting on a table. 2. Keeping your upper body and shoulder still, use your other hand to rotate your forearm palm-up until you feel a gentle to moderate stretch. 3. Hold this position for __________ seconds. 4. Slowly release the stretch and return to the starting position. Repeat __________ times. Complete this exercise __________ times a day. Exercise H:  Forearm Rotation, Pronation, Passive 1. Sit with your left / right elbow bent to an "L" shape (90 degrees) with your forearm resting on a table. 2. Keeping your upper body and shoulder still, use your other hand to rotate your forearm palm-down until you feel a gentle to moderate stretch. 3. Hold this  position for __________ seconds. 4. Slowly release the stretch and return to the starting position. Repeat __________ times. Complete this exercise __________ times a day. STRENGTHENING EXERCISES These exercises build strength and endurance in your wrist and forearm. Endurance is the ability to use your muscles for a long time, even after they get tired. Exercise I: Wrist Flexors  1. Sit with your left / right forearm supported on a table and your hand resting palm-up over the edge of the table. Your elbow should be bent to an "L" shape (about 90 degrees) and be below the level of your shoulder. 2. Hold a __________ weight in your left / right hand. Or, hold a rubber exercise band or tube in both hands, keeping your hands at the same level and hip distance apart. There should be a slight tension in the exercise band or tube. 3. Slowly curl your hand up toward your forearm. 4. Hold this position for __________ seconds. 5. Slowly lower your hand back to the starting position. Repeat __________ times. Complete this exercise __________ times a day. Exercise J: Wrist Extensors  1. Sit with your left / right forearm supported on a table and your hand resting palm-down over the edge of the table. Your elbow should be bent to an "L" shape (about 90 degrees) and be below the level of your shoulder. 2. Hold a __________ weight in your left / right hand. Or, hold a rubber exercise band or tube in both hands, keeping your hands at the same level and hip distance apart. There should be a slight tension in the exercise band or tube. 3. Slowly curl your hand up toward your forearm. 4. Hold this position for __________ seconds. 5. Slowly lower your hand back to the starting position. Repeat __________ times. Complete this exercise __________ times a day. Exercise K: Forearm Rotation, Supination  1. Sit with your left / right forearm supported on a table and your hand resting palm-down. Your elbow should be at  your side, bent to an "L" shape (about 90 degrees), and below the level of your shoulder. Keep your wrist stable and in a neutral position throughout the exercise. 2. Gently hold a lightweight hammer with your left / right hand. 3. Without moving your elbow or wrist, slowly rotate your palm upward to a thumbs-up position. 4. Hold this position for __________ seconds. 5. Slowly return your forearm to the starting position. Repeat __________ times. Complete this exercise __________ times a day. Exercise L: Forearm Rotation, Pronation  1. Sit with your left / right forearm supported on a table and your hand resting palm-up. Your elbow should be at your side, bent to an "L" shape (about 90 degrees), and below the level of your shoulder. Keep your wrist stable. Do not allow it to move backward or forward during the exercise. 2. Gently hold a lightweight hammer with your left / right hand. 3. Without moving your elbow or wrist, slowly rotate your palm and hand upward to a thumbs-up position. 4. Hold this position for __________ seconds. 5. Slowly return your forearm to the starting position. Repeat __________ times. Complete this exercise __________ times a day. Exercise M: Grip  Strengthening  1. Hold one of these items in your left / right hand: play dough, therapy putty, a dense sponge, a stress ball, or a large, rolled sock. 2. Squeeze as hard as you can without increasing pain. 3. Hold this position for __________ seconds. 4. Slowly release your grip. Repeat __________ times. Complete this exercise __________ times a day. This information is not intended to replace advice given to you by your health care provider. Make sure you discuss any questions you have with your health care provider. Document Released: 08/27/2005 Document Revised: 07/07/2016 Document Reviewed: 07/08/2015 Elsevier Interactive Patient Education  [employer].

## 2018-05-12 NOTE — Assessment & Plan Note (Signed)
Under good control. Doing much better. Call with any concerns. Follow up ADD in September.

## 2018-05-25 ENCOUNTER — Encounter: Payer: Self-pay | Admitting: Family Medicine

## 2018-06-02 ENCOUNTER — Other Ambulatory Visit: Payer: Self-pay | Admitting: Family Medicine

## 2018-06-02 MED ORDER — AMPHETAMINE-DEXTROAMPHET ER 15 MG PO CP24: 15.0000 mg | ORAL_CAPSULE | Freq: Every day | ORAL | 0 refills | Status: DC

## 2018-06-08 ENCOUNTER — Ambulatory Visit: Payer: Medicaid Other | Admitting: Family Medicine

## 2018-06-13 ENCOUNTER — Other Ambulatory Visit: Payer: Self-pay | Admitting: Family Medicine

## 2018-06-14 NOTE — Telephone Encounter (Signed)
refill Last Refill:10/30/17 #90 tabs with 1 refill Last OV: 05/12/18 PCP: Merrie Roof, PA Pharmacy:[facility]

## 2018-06-16 ENCOUNTER — Encounter: Payer: Self-pay | Admitting: Family Medicine

## 2018-07-05 ENCOUNTER — Other Ambulatory Visit: Payer: Self-pay | Admitting: Family Medicine

## 2018-07-05 MED ORDER — AMPHETAMINE-DEXTROAMPHET ER 15 MG PO CP24: 15.0000 mg | ORAL_CAPSULE | Freq: Every day | ORAL | 0 refills | Status: DC

## 2018-07-05 NOTE — Telephone Encounter (Signed)
Your patient 

## 2018-07-12 ENCOUNTER — Ambulatory Visit: Payer: Medicaid Other | Admitting: Family Medicine

## 2018-07-15 ENCOUNTER — Ambulatory Visit: Payer: Medicaid Other | Admitting: Family Medicine

## 2018-08-05 ENCOUNTER — Other Ambulatory Visit: Payer: Self-pay | Admitting: Family Medicine

## 2018-08-05 ENCOUNTER — Ambulatory Visit (INDEPENDENT_AMBULATORY_CARE_PROVIDER_SITE_OTHER): Payer: Medicaid Other | Admitting: Family Medicine

## 2018-08-05 ENCOUNTER — Encounter: Payer: Self-pay | Admitting: Family Medicine

## 2018-08-05 VITALS — BP 112/76 | HR 89 | Temp 98.5°F | Ht 64.0 in | Wt 199.0 lb

## 2018-08-05 DIAGNOSIS — F410 Panic disorder [episodic paroxysmal anxiety] without agoraphobia: Secondary | ICD-10-CM | POA: Diagnosis not present

## 2018-08-05 DIAGNOSIS — Z23 Encounter for immunization: Secondary | ICD-10-CM

## 2018-08-05 DIAGNOSIS — F988 Other specified behavioral and emotional disorders with onset usually occurring in childhood and adolescence: Secondary | ICD-10-CM

## 2018-08-05 DIAGNOSIS — F411 Generalized anxiety disorder: Secondary | ICD-10-CM | POA: Diagnosis not present

## 2018-08-05 MED ORDER — AMPHETAMINE-DEXTROAMPHET ER 15 MG PO CP24: 15.0000 mg | ORAL_CAPSULE | ORAL | 0 refills | Status: DC

## 2018-08-05 MED ORDER — AMPHETAMINE-DEXTROAMPHET ER 15 MG PO CP24: 15.0000 mg | ORAL_CAPSULE | Freq: Every day | ORAL | 0 refills | Status: DC

## 2018-08-05 NOTE — Assessment & Plan Note (Signed)
Doing much better off medicine. If starts having more problems, will start lexapro again. Call with any concerns.

## 2018-08-05 NOTE — Assessment & Plan Note (Signed)
Under good control on current regimen. Continue current regimen. Continue to monitor. Call with any concerns. Refills given for the next 3 months.

## 2018-08-05 NOTE — Progress Notes (Signed)
BP 112/76 (BP Location: Left Arm, Patient Position: Sitting, Cuff Size: Normal)   Pulse 89   Temp 98.5 F (36.9 C)   Ht 5' 4"  (1.626 m)   Wt 199 lb (90.3 kg)   SpO2 99%   BMI 34.16 kg/m    Subjective:    Patient ID: Cheryl Flowers, female    DOB: [birth year], 37 y.o.   MRN: [id number]  HPI: Cheryl Flowers is a 37 y.o. female  Chief Complaint  Patient presents with  . ADD  . Anxiety   ADHD FOLLOW UP ADHD status: controlled Satisfied with current therapy: yes Medication compliance:  excellent compliance Controlled substance contract: yes Previous psychiatry evaluation: no Previous medications: no adderall   Taking meds on weekends/vacations: yes Work/school performance:  excellent Difficulty sustaining attention/completing tasks: no Distracted by extraneous stimuli: no Does not listen when spoken to: no  Fidgets with hands or feet: no Unable to stay in seat: no Blurts out/interrupts others: no ADHD Medication Side Effects: no    Decreased appetite: no    Headache: no    Sleeping disturbance pattern: no    Irritability: no    Rebound effects (worse than baseline) off medication: no    Anxiousness: no    Dizziness: no    Tics: no  ANXIETY/STRESS- has a bit extra anxiety. Took herself off her lexapro and seroquel Duration:better Anxious mood: yes  Excessive worrying: no Irritability: no  Sweating: no Nausea: no Palpitations:no Hyperventilation: no Panic attacks: no Agoraphobia: no  Obscessions/compulsions: no Depressed mood: no Depression screen [facility] 2/9 05/12/2018 03/30/2018 03/04/2018 02/26/2018 01/29/2018  Decreased Interest 0 0 1 1 2   Down, Depressed, Hopeless 0 0 0 0 0  PHQ - 2 Score 0 0 1 1 2   Altered sleeping 0 0 0 0 2  Tired, decreased energy 0 1 2 3 3   Change in appetite 0 2 1 0 0  Feeling bad or failure about yourself  0 0 0 0 1  Trouble concentrating 1 2 3 3 3   Moving slowly or fidgety/restless 0 0 0 0 1  Suicidal thoughts 0 0 0 0  0  PHQ-9 Score 1 5 7 7 12   Difficult doing work/chores Not difficult at all Somewhat difficult Somewhat difficult - Very difficult   Anhedonia: no Weight changes: no Insomnia: no   Hypersomnia: no Fatigue/loss of energy: no Feelings of worthlessness: no Feelings of guilt: no Impaired concentration/indecisiveness: no Suicidal ideations: no  Crying spells: no Recent Stressors/Life Changes: no   Relationship problems: no   Family stress: no     Financial stress: no    Job stress: yes    Recent death/loss: no  Relevant past medical, surgical, family and social history reviewed and updated as indicated. Interim medical history since our last visit reviewed. Allergies and medications reviewed and updated.  Review of Systems  Constitutional: Negative.   Respiratory: Negative.   Cardiovascular: Negative.   Psychiatric/Behavioral: Negative.     Per HPI unless specifically indicated above     Objective:    BP 112/76 (BP Location: Left Arm, Patient Position: Sitting, Cuff Size: Normal)   Pulse 89   Temp 98.5 F (36.9 C)   Ht 5' 4"  (1.626 m)   Wt 199 lb (90.3 kg)   SpO2 99%   BMI 34.16 kg/m   Wt Readings from Last 3 Encounters:  08/05/18 199 lb (90.3 kg)  05/12/18 206 lb (93.4 kg)  03/30/18 207 lb 7 oz (94.1 kg)  Physical Exam  Constitutional: She is oriented to person, place, and time. She appears well-developed and well-nourished. No distress.  HENT:  Head: Normocephalic and atraumatic.  Right Ear: Hearing normal.  Left Ear: Hearing normal.  Nose: Nose normal.  Eyes: Conjunctivae and lids are normal. Right eye exhibits no discharge. Left eye exhibits no discharge. No scleral icterus.  Cardiovascular: Normal rate, regular rhythm, normal heart sounds and intact distal pulses. Exam reveals no gallop and no friction rub.  No murmur heard. Pulmonary/Chest: Effort normal and breath sounds normal. No stridor. No respiratory distress. She has no wheezes. She has no rales.  She exhibits no tenderness.  Musculoskeletal: Normal range of motion.  Neurological: She is alert and oriented to person, place, and time.  Skin: Skin is dry and intact. Capillary refill takes less than 2 seconds. No rash noted. She is not diaphoretic. No erythema. No pallor.  Psychiatric: She has a normal mood and affect. Her speech is normal and behavior is normal. Judgment and thought content normal. Cognition and memory are normal.  Nursing note and vitals reviewed.   Results for orders placed or performed in visit on 04/22/18  Varicella Zoster Abs, IgG/IgM  Result Value Ref Range   Varicella zoster IgG 1,944 Immune >165 index   Varicella IgM <0.91 0.00 - 0.90 index  Measles/Mumps/Rubella Immunity  Result Value Ref Range   Rubella Antibodies, IGG 1.59 Immune >0.99 index   RUBEOLA AB, IGG 38.2 Immune >29.9 AU/mL   MUMPS ABS, IGG 15.9 Immune >10.9 AU/mL      Assessment & Plan:   Problem List Items Addressed This Visit      Other   Generalized anxiety disorder with panic attacks    Doing much better off medicine. If starts having more problems, will start lexapro again. Call with any concerns.       Attention deficit disorder (ADD) in adult - Primary    Under good control on current regimen. Continue current regimen. Continue to monitor. Call with any concerns. Refills given for the next 3 months.         Other Visit Diagnoses    Immunization due       Flu shot given today.   Relevant Orders   Flu Vaccine QUAD 6+ mos PF IM (Fluarix Quad PF) (Completed)       Follow up plan: Return in about 3 months (around 11/05/2018).

## 2018-08-05 NOTE — Telephone Encounter (Signed)
Sent!

## 2018-09-07 ENCOUNTER — Other Ambulatory Visit: Payer: Self-pay | Admitting: Family Medicine

## 2018-09-07 NOTE — Telephone Encounter (Signed)
Rx should have dropped to pharmacy on 09/04/18- Rx receipt confirmed by pharmacy in chart.

## 2018-09-08 ENCOUNTER — Encounter: Payer: Self-pay | Admitting: Family Medicine

## 2018-10-05 ENCOUNTER — Ambulatory Visit: Payer: Medicaid Other | Admitting: Family Medicine

## 2018-10-29 ENCOUNTER — Encounter: Payer: Self-pay | Admitting: Unknown Physician Specialty

## 2018-10-29 ENCOUNTER — Encounter: Payer: Self-pay | Admitting: Family Medicine

## 2018-10-29 ENCOUNTER — Ambulatory Visit (INDEPENDENT_AMBULATORY_CARE_PROVIDER_SITE_OTHER): Payer: Medicaid Other | Admitting: Unknown Physician Specialty

## 2018-10-29 VITALS — BP 134/89 | HR 116 | Temp 98.3°F | Wt 203.0 lb

## 2018-10-29 DIAGNOSIS — N760 Acute vaginitis: Secondary | ICD-10-CM | POA: Diagnosis not present

## 2018-10-29 DIAGNOSIS — B029 Zoster without complications: Secondary | ICD-10-CM | POA: Diagnosis not present

## 2018-10-29 DIAGNOSIS — B9689 Other specified bacterial agents as the cause of diseases classified elsewhere: Secondary | ICD-10-CM

## 2018-10-29 LAB — CBC WITH DIFFERENTIAL/PLATELET
Hematocrit: 36.8 % (ref 34.0–46.6)
Hemoglobin: 12.2 g/dL (ref 11.1–15.9)
Lymphocytes Absolute: 2.9 10*3/uL (ref 0.7–3.1)
Lymphs: 29 %
MCH: 31.5 pg (ref 26.6–33.0)
MCHC: 33.2 g/dL (ref 31.5–35.7)
MCV: 95 fL (ref 79–97)
MID (Absolute): 0.4 10*3/uL (ref 0.1–1.6)
MID: 4 %
Neutrophils Absolute: 7 10*3/uL (ref 1.4–7.0)
Neutrophils: 68 %
Platelets: 382 10*3/uL (ref 150–450)
RBC: 3.87 x10E6/uL (ref 3.77–5.28)
RDW: 14.6 % (ref 12.3–15.4)
WBC: 10.3 10*3/uL (ref 3.4–10.8)

## 2018-10-29 MED ORDER — VALACYCLOVIR HCL 1 G PO TABS: 1000.0000 mg | ORAL_TABLET | Freq: Three times a day (TID) | ORAL | 0 refills | Status: DC

## 2018-10-29 MED ORDER — HYDROCODONE-ACETAMINOPHEN 5-325 MG PO TABS: 1.0000 | ORAL_TABLET | Freq: Four times a day (QID) | ORAL | 0 refills | Status: DC | PRN

## 2018-10-29 MED ORDER — METRONIDAZOLE 0.75 % EX GEL: 1.0000 "application " | Freq: Two times a day (BID) | CUTANEOUS | 0 refills | Status: DC

## 2018-10-29 NOTE — Telephone Encounter (Signed)
Called patient and scheduled OV w/ Dr. Julian Hy, DNP today at 1 p.m.

## 2018-10-29 NOTE — Progress Notes (Signed)
BP 134/89   Pulse (!) 116   Temp 98.3 F (36.8 C) (Oral)   Wt 203 lb (92.1 kg)   SpO2 100%   BMI 34.84 kg/m    Subjective:    Patient ID: Cheryl Flowers, female    DOB: [birth year], 38 y.o.   MRN: [id number]  HPI: Cheryl Flowers is a 38 y.o. female  Chief Complaint  Patient presents with  . Herpes Zoster    Symptoms began 10/19/18, Pt saw Employee health, Did get acylovir, d/c after 2 days due to headache. Now believes headace due to shingles not medication    12/24 went to urgent care for a rash on left side of head and neck.  Diagnosed with zoster.  Stopped Valcyclovir as above.  Left ear hurts but no hearing changes. Shooting pain left arm as well as headache.  Tylenol and Ibuprofen not helping but some CBD gummies she got at the corner store helped for a few hours.  In general she is fatigue.    States she is having trouble in general with left ear and jaw being painful   BV- gets recurrent BV and thinks she has another infection.    Relevant past medical, surgical, family and social history reviewed and updated as indicated. Interim medical history since our last visit reviewed. Allergies and medications reviewed and updated.  Review of Systems  Constitutional: Positive for chills and fatigue. Negative for fever.  HENT: Positive for congestion. Negative for ear pain, sinus pressure and sinus pain.   Gastrointestinal: Negative.   Genitourinary: Negative.   Skin: Positive for rash.  Psychiatric/Behavioral: Negative.     Per HPI unless specifically indicated above     Objective:    BP 134/89   Pulse (!) 116   Temp 98.3 F (36.8 C) (Oral)   Wt 203 lb (92.1 kg)   SpO2 100%   BMI 34.84 kg/m   Wt Readings from Last 3 Encounters:  10/29/18 203 lb (92.1 kg)  08/05/18 199 lb (90.3 kg)  05/12/18 206 lb (93.4 kg)    Physical Exam Constitutional:      General: She is not in acute distress.    Appearance: Normal appearance. She is  well-developed.  HENT:     Head: Normocephalic and atraumatic.  Eyes:     General: Lids are normal. No scleral icterus.       Right eye: No discharge.        Left eye: No discharge.     Conjunctiva/sclera: Conjunctivae normal.  Neck:     Musculoskeletal: Normal range of motion and neck supple. No neck rigidity.     Vascular: No carotid bruit or JVD.     Comments: Tender left cervical area with erythema Cardiovascular:     Rate and Rhythm: Normal rate and regular rhythm.     Heart sounds: Normal heart sounds.  Pulmonary:     Effort: Pulmonary effort is normal.     Breath sounds: Normal breath sounds.  Abdominal:     Palpations: There is no hepatomegaly or splenomegaly.  Musculoskeletal: Normal range of motion.  Skin:    General: Skin is warm and dry.     Coloration: Skin is not pale.     Findings: No rash.  Neurological:     Mental Status: She is alert and oriented to person, place, and time.  Psychiatric:        Behavior: Behavior normal.        Thought Content: Thought content  normal.        Judgment: Judgment normal.    CBC is normal  Results for orders placed or performed in visit on 04/22/18  Varicella Zoster Abs, IgG/IgM  Result Value Ref Range   Varicella zoster IgG 1,944 Immune >165 index   Varicella IgM <0.91 0.00 - 0.90 index  Measles/Mumps/Rubella Immunity  Result Value Ref Range   Rubella Antibodies, IGG 1.59 Immune >0.99 index   RUBEOLA AB, IGG 38.2 Immune >29.9 AU/mL   MUMPS ABS, IGG 15.9 Immune >10.9 AU/mL      Assessment & Plan:   Problem List Items Addressed This Visit    None    Visit Diagnoses    Herpes zoster without complication    -  Primary   Restart Valcyclovir.  Refer to ENT due to possible ear involvement.  Note to be out of work for a week. Short course of Vicoden for pain   Relevant Medications   valACYclovir (VALTREX) 1000 MG tablet   Other Relevant Orders   CBC With Differential/Platelet   Ambulatory referral to ENT   BV  (bacterial vaginosis)       No exam done today.  Will refill Metrondazole based on symptoms and history   Relevant Medications   valACYclovir (VALTREX) 1000 MG tablet       Follow up plan: Return if symptoms worsen or fail to improve.

## 2018-11-03 ENCOUNTER — Telehealth: Payer: Self-pay

## 2018-11-03 NOTE — Telephone Encounter (Signed)
PA for Metronidazole 0.75% Gel Approved with medicaid.  Confirmation #: [id number]  Prior Approval #: [id number]

## 2018-11-04 ENCOUNTER — Other Ambulatory Visit: Payer: Self-pay | Admitting: Family Medicine

## 2018-11-08 ENCOUNTER — Encounter: Payer: Self-pay | Admitting: Family Medicine

## 2018-11-09 ENCOUNTER — Other Ambulatory Visit: Payer: Self-pay | Admitting: Family Medicine

## 2018-11-09 MED ORDER — AMPHETAMINE-DEXTROAMPHET ER 15 MG PO CP24: 15.0000 mg | ORAL_CAPSULE | ORAL | 0 refills | Status: DC

## 2018-11-15 DIAGNOSIS — G501 Atypical facial pain: Secondary | ICD-10-CM | POA: Diagnosis not present

## 2018-11-15 DIAGNOSIS — K219 Gastro-esophageal reflux disease without esophagitis: Secondary | ICD-10-CM | POA: Diagnosis not present

## 2018-11-15 DIAGNOSIS — H9209 Otalgia, unspecified ear: Secondary | ICD-10-CM | POA: Diagnosis not present

## 2018-11-16 ENCOUNTER — Other Ambulatory Visit: Payer: Self-pay | Admitting: Physician Assistant

## 2018-11-16 ENCOUNTER — Other Ambulatory Visit (HOSPITAL_COMMUNITY): Payer: Self-pay | Admitting: Physician Assistant

## 2018-11-16 DIAGNOSIS — H9202 Otalgia, left ear: Secondary | ICD-10-CM

## 2018-11-22 NOTE — Telephone Encounter (Signed)
Called pharmacy, medication is still not going through, will check with insurance tomorrow.

## 2018-11-23 ENCOUNTER — Ambulatory Visit: Payer: Medicaid Other

## 2018-11-23 NOTE — Telephone Encounter (Signed)
Attempted to reach to verify correct insurance coverage.

## 2018-11-24 ENCOUNTER — Telehealth: Payer: Self-pay

## 2018-11-24 NOTE — Telephone Encounter (Signed)
PA for Metronidazole still being denied according to pharmacy. Resubmitted PA, request was approved. Confirmation number: [id number] W PA number: [id number]

## 2018-11-24 NOTE — Telephone Encounter (Signed)
Cheryl Flowers 11/23/2018 01:44 PM  Summary: General     Patient called back stating that her Insurance is still the Medicaid, effective 10/27/2018, I verified the id number with her and what we have in the system is correct.   Best call back is [phone]

## 2018-11-26 ENCOUNTER — Ambulatory Visit
Admission: RE | Admit: 2018-11-26 | Discharge: 2018-11-26 | Disposition: A | Discharge status: Home / Self Care | Payer: Medicaid Other | Source: Ambulatory Visit | Attending: Physician Assistant | Admitting: Physician Assistant

## 2018-11-26 DIAGNOSIS — H9202 Otalgia, left ear: Secondary | ICD-10-CM | POA: Diagnosis not present

## 2018-11-26 DIAGNOSIS — M542 Cervicalgia: Secondary | ICD-10-CM | POA: Diagnosis not present

## 2018-11-26 MED ORDER — IOPAMIDOL (ISOVUE-300) INJECTION 61%
75.0000 mL | Freq: Once | INTRAVENOUS | Status: AC | PRN
  Administered 2018-11-26: 75 mL via INTRAVENOUS

## 2018-12-02 DIAGNOSIS — H9209 Otalgia, unspecified ear: Secondary | ICD-10-CM | POA: Diagnosis not present

## 2018-12-02 DIAGNOSIS — R918 Other nonspecific abnormal finding of lung field: Secondary | ICD-10-CM | POA: Diagnosis not present

## 2018-12-06 ENCOUNTER — Other Ambulatory Visit: Payer: Self-pay

## 2018-12-06 ENCOUNTER — Encounter: Payer: Medicaid Other | Admitting: Family Medicine

## 2018-12-08 NOTE — Progress Notes (Signed)
[facility] Consultation      Assessment and Plan:  Asthma with dyspnea on exertion.  - Mild dyspnea, suspect asthma, likely secondary to cigarette smoking. - We will start Brio inhaler once daily, patient is given a coupon.  Lung nodule. - Appears low risk. - CT chest in 9 months.  Nicotine abuse. - Discussed the importance of smoke cessation, spent 3 minutes in discussion. - Sent prescription for nicotine patch.  Set a quit date on her birthday.  Excessive daytime sleepiness. - We will send for sleep study, rule out sleep apnea.  Orders Placed This Encounter  Procedures  . CT CHEST WO CONTRAST  . Split night study   Return in about 3 months (around 03/10/2019).     Date: 12/10/2018  MRN# [id number] Cheryl Flowers 06/26/1981   Cheryl Flowers is a 38 y.o. old female seen in consultation for chief complaint of:    Chief Complaint  Patient presents with  . Consult    Referred by Cheryl Flowers @ ENT for eval of lung nodule.  . Cough    dry cough with some wheeze  . Shortness of Breath    increased over past several months.    HPI:  She got shingles around [place] the left side of her neck. Further eval of this at ENT led to a CT of the neck which showed a lung.  She feels that breathing is ok but she has mild dyspnea. She smokes about a ppd for about 16 years. She is thinking about quitting, she has quit for 3 months in the past with the past, she thinks that she is ready to try again.  Denies weight loss but thought due to aderal. She does have a cough at night and in early AM. Denies hemoptysis.  She has no pets inside, she has never been tested for allergies, denies reflux.   CXR 02/20/18, Ct chest 01/20/09>> imaging personally reviewed; mild bibasilar atelectasis. Lungs otherwise normal.  **CBC 02/26/18>> Abs eos 100.   PMHX:   Past Medical History:  Diagnosis Date  . Anxiety   . Depression   . Low back pain   .  Marital conflict   . Panic disorder   . Thyromegaly    Surgical Hx:  Past Surgical History:  Procedure Laterality Date  . CESAREAN SECTION  2003 and 2010  . CHOLECYSTECTOMY  2007  . TUBAL LIGATION  2010   Family Hx:  Family History  Problem Relation Age of Onset  . Hypertension Mother    Social Hx:   Social History   Tobacco Use  . Smoking status: Current Every Day Smoker    Packs/day: 1.00    Years: 19.00    Pack years: 19.00    Types: Cigarettes  . Smokeless tobacco: Never Used  Substance Use Topics  . Alcohol use: Yes    Alcohol/week: 3.0 standard drinks    Types: 3 Shots of liquor per week  . Drug use: No   Medication:    Current Outpatient Medications:  .  amphetamine-dextroamphetamine (ADDERALL XR) 15 MG 24 hr capsule, Take 1 capsule by mouth daily., Disp: 30 capsule, Rfl: 0 .  cyclobenzaprine (FLEXERIL) 10 MG tablet, TAKE 1 TABLET BY MOUTH THREE TIMES A DAY AS NEEDED FOR MUSCLE SPASMS, Disp: 90 tablet, Rfl: 0 .  triamterene-hydrochlorothiazide (MAXZIDE-25) 37.5-25 MG tablet, TAKE 1 TABLET BY MOUTH EVERY DAY, Disp: 90 tablet, Rfl: 1 .  amphetamine-dextroamphetamine (ADDERALL XR) 15 MG 24 hr capsule,  Take 1 capsule by mouth every morning., Disp: 30 capsule, Rfl: 0 .  amphetamine-dextroamphetamine (ADDERALL XR) 15 MG 24 hr capsule, Take 1 capsule by mouth every morning for 30 days., Disp: 30 capsule, Rfl: 0   Allergies:  Morphine and related and Sulfa antibiotics  Review of Systems: Gen:  Denies  fever, sweats, chills HEENT: Denies blurred vision, double vision. bleeds, sore throat Cvc:  No dizziness, chest pain. Resp:   Denies cough or sputum production, shortness of breath Gi: Denies swallowing difficulty, stomach pain. Gu:  Denies bladder incontinence, burning urine Ext:   No Joint pain, stiffness. Skin: No skin rash,  hives  Endoc:  No polyuria, polydipsia. Psych: No depression, insomnia. Other:  All other systems were reviewed with the patient and were  negative other that what is mentioned in the HPI.   Physical Examination:   VS: BP 124/70 (BP Location: Left Arm, Cuff Size: Large)   Pulse 96   Resp 16   Ht 5' 4"  (1.626 m)   Wt 204 lb (92.5 kg)   SpO2 99%   BMI 35.02 kg/m   General Appearance: No distress  Neuro:without focal findings,  speech normal,  HEENT: PERRLA, EOM intact.   Pulmonary: normal breath sounds, No wheezing.  CardiovascularNormal S1,S2.  No m/r/g.   Abdomen: Benign, Soft, non-tender. Renal:  No costovertebral tenderness  GU:  No performed at this time. Endoc: No evident thyromegaly, no signs of acromegaly. Skin:   warm, no rashes, no ecchymosis  Extremities: normal, no cyanosis, clubbing.  Other findings:    LABORATORY PANEL:   CBC No results for input(s): WBC, HGB, HCT, PLT in the last 168 hours. ------------------------------------------------------------------------------------------------------------------  Chemistries  No results for input(s): NA, K, CL, CO2, GLUCOSE, BUN, CREATININE, CALCIUM, MG, AST, ALT, ALKPHOS, BILITOT in the last 168 hours.  Invalid input(s): GFRCGP ------------------------------------------------------------------------------------------------------------------  Cardiac Enzymes No results for input(s): TROPONINI in the last 168 hours. ------------------------------------------------------------  RADIOLOGY:  No results found.     Thank  you for the consultation and for allowing [facility] to assist in the care of your patient. Our recommendations are noted above.  Please contact us if we can be of further service.   Cheryl Flowers, M.D., F.C.C.P.  Board Certified in Internal Medicine, Pulmonary Medicine, [place], and Sleep Medicine.  [facility] Office Number: [phone]   12/10/2018

## 2018-12-10 ENCOUNTER — Encounter: Payer: Self-pay | Admitting: Internal Medicine

## 2018-12-10 ENCOUNTER — Encounter: Payer: Self-pay | Admitting: *Deleted

## 2018-12-10 ENCOUNTER — Ambulatory Visit (INDEPENDENT_AMBULATORY_CARE_PROVIDER_SITE_OTHER): Payer: Medicaid Other | Admitting: Internal Medicine

## 2018-12-10 VITALS — BP 124/70 | HR 96 | Resp 16 | Ht 64.0 in | Wt 204.0 lb

## 2018-12-10 DIAGNOSIS — R918 Other nonspecific abnormal finding of lung field: Secondary | ICD-10-CM

## 2018-12-10 DIAGNOSIS — F17210 Nicotine dependence, cigarettes, uncomplicated: Secondary | ICD-10-CM | POA: Diagnosis not present

## 2018-12-10 DIAGNOSIS — G4719 Other hypersomnia: Secondary | ICD-10-CM | POA: Diagnosis not present

## 2018-12-10 DIAGNOSIS — J4540 Moderate persistent asthma, uncomplicated: Secondary | ICD-10-CM

## 2018-12-10 MED ORDER — FLUTICASONE-SALMETEROL 250-50 MCG/DOSE IN AEPB: 1.0000 | INHALATION_SPRAY | Freq: Two times a day (BID) | RESPIRATORY_TRACT | 2 refills | Status: DC

## 2018-12-10 MED ORDER — FLUTICASONE FUROATE-VILANTEROL 200-25 MCG/INH IN AEPB: 1.0000 | INHALATION_SPRAY | Freq: Every day | RESPIRATORY_TRACT | 5 refills | Status: DC

## 2018-12-10 MED ORDER — NICOTINE 21 MG/24HR TD PT24: 21.0000 mg | MEDICATED_PATCH | TRANSDERMAL | 3 refills | Status: DC

## 2018-12-10 NOTE — Patient Instructions (Addendum)
Start Breo inhaler once daily, rinse mouth after use.  We will give you a coupon Will send for sleep study.  CT chest in 9 months. Given prescription for nicotine patch.  --Quitting smoking is the most important thing that you can do for your health.  --Quitting smoking will have greater affect on your health than any medicine that we can give you.   --The best way to quit is to set a quit date, usually a day that has meaning like someone's birthday, anniversary, or holiday.   --Start any medication prescribed for quitting one week before you quit date. Then toss out the cigarettes on your quit date.  --If you start smoking again, start from scratch--set another quit day and try again!   Sleep Apnea    Sleep apnea is disorder that affects a person's sleep. A person with sleep apnea has abnormal pauses in their breathing when they sleep. It is hard for them to get a good sleep. This makes a person tired during the day. It also can lead to other physical problems. There are three types of sleep apnea. One type is when breathing stops for a short time because your airway is blocked (obstructive sleep apnea). Another type is when the brain sometimes fails to give the normal signal to breathe to the muscles that control your breathing (central sleep apnea). The third type is a combination of the other two types.  HOME CARE   Take all medicine as told by your doctor.  Avoid alcohol, calming medicines (sedatives), and depressant drugs.  Try to lose weight if you are overweight. Talk to your doctor about a healthy weight goal.  Your doctor may have you use a device that helps to open your airway. It can help you get the air that you need. It is called a positive airway pressure (PAP) device.   MAKE SURE YOU:   Understand these instructions.  Will watch your condition.  Will get help right away if you are not doing well or get worse.  It may take approximately 1 month for you to get  used to wearing her CPAP every night.  Be sure to work with your machine to get used to it, be patient, it may take time!  If you have trouble tolerating CPAP DO NOT RETURN YOUR MACHINE; Contact our office to see if we can help you tolerate the CPAP better first!

## 2018-12-10 NOTE — Addendum Note (Signed)
Addended by: Stephanie Coup on: 12/10/2018 12:40 PM   Modules accepted: Orders

## 2018-12-14 ENCOUNTER — Other Ambulatory Visit: Payer: Self-pay | Admitting: Physician Assistant

## 2018-12-14 DIAGNOSIS — E079 Disorder of thyroid, unspecified: Secondary | ICD-10-CM

## 2018-12-21 ENCOUNTER — Telehealth: Payer: Self-pay

## 2018-12-21 NOTE — Telephone Encounter (Signed)
I spoke to Butch Penny at [facility] in regards to Cheryl Flowers. It is a preferred med, the pharmacy just has to run as brand, they have been running as generic (Call ID: [id number]). Spoke to pharmacist at [facility], they will run as brand. Will let patient know.

## 2019-01-06 ENCOUNTER — Encounter: Payer: Self-pay | Admitting: Family Medicine

## 2019-01-06 ENCOUNTER — Ambulatory Visit (INDEPENDENT_AMBULATORY_CARE_PROVIDER_SITE_OTHER): Payer: Medicaid Other | Admitting: Family Medicine

## 2019-01-06 ENCOUNTER — Other Ambulatory Visit: Payer: Self-pay

## 2019-01-06 VITALS — BP 125/83 | HR 97 | Temp 99.1°F | Ht 64.0 in | Wt 210.0 lb

## 2019-01-06 DIAGNOSIS — I10 Essential (primary) hypertension: Secondary | ICD-10-CM

## 2019-01-06 DIAGNOSIS — F988 Other specified behavioral and emotional disorders with onset usually occurring in childhood and adolescence: Secondary | ICD-10-CM | POA: Diagnosis not present

## 2019-01-06 DIAGNOSIS — F331 Major depressive disorder, recurrent, moderate: Secondary | ICD-10-CM

## 2019-01-06 DIAGNOSIS — F410 Panic disorder [episodic paroxysmal anxiety] without agoraphobia: Secondary | ICD-10-CM

## 2019-01-06 DIAGNOSIS — F411 Generalized anxiety disorder: Secondary | ICD-10-CM

## 2019-01-06 MED ORDER — AMPHETAMINE-DEXTROAMPHET ER 15 MG PO CP24: 15.0000 mg | ORAL_CAPSULE | ORAL | 0 refills | Status: DC

## 2019-01-06 MED ORDER — AMPHETAMINE-DEXTROAMPHET ER 15 MG PO CP24: 15.0000 mg | ORAL_CAPSULE | Freq: Every day | ORAL | 0 refills | Status: DC

## 2019-01-06 MED ORDER — QUETIAPINE FUMARATE 50 MG PO TABS: 50.0000 mg | ORAL_TABLET | Freq: Every day | ORAL | 3 refills | Status: DC

## 2019-01-06 MED ORDER — TRIAMTERENE-HCTZ 37.5-25 MG PO TABS: 1.0000 | ORAL_TABLET | Freq: Every day | ORAL | 1 refills | Status: DC

## 2019-01-06 NOTE — Assessment & Plan Note (Signed)
Under good control on current regimen. Continue current regimen. Continue to monitor. Call with any concerns. Refills given. Labs drawn today.   

## 2019-01-06 NOTE — Assessment & Plan Note (Signed)
Due to BMI>35 with depression and HTN. Will work on diet and exercise with goal of losing 1-2lbs a week. Call with any concerns.

## 2019-01-06 NOTE — Assessment & Plan Note (Signed)
Restarted her seroquel for sleep. Needs refill. Stable. Does not want to go back on lexapro. Continue to monitor.

## 2019-01-06 NOTE — Assessment & Plan Note (Signed)
Stable on current regimen. Refills given today for 3 months. Call with any concerns.

## 2019-01-06 NOTE — Progress Notes (Signed)
BP 125/83   Pulse 97   Temp 99.1 F (37.3 C) (Oral)   Ht 5' 4"  (1.626 m)   Wt 210 lb (95.3 kg)   BMI 36.05 kg/m    Subjective:    Patient ID: Cheryl Flowers, female    DOB: 08/03/1981, 38 y.o.   MRN: [id number]  HPI: Cheryl Flowers is a 38 y.o. female  Chief Complaint  Patient presents with  . Anxiety   ADHD FOLLOW UP- has been off her adderall for 3 weeks. Has been noticing a difference. Did well when she was on it. Would occasionally feel pretty tired on it.  ADHD status: exacerbated Satisfied with current therapy: yes Medication compliance:  good compliance Controlled substance contract: yes Previous psychiatry evaluation: no Previous medications: no    Taking meds on weekends/vacations: yes Work/school performance:  good Difficulty sustaining attention/completing tasks: no Distracted by extraneous stimuli: no Does not listen when spoken to: no  Fidgets with hands or feet: no Unable to stay in seat: no Blurts out/interrupts others: no ADHD Medication Side Effects: no    Decreased appetite: no    Headache: no    Sleeping disturbance pattern: no    Irritability: no    Rebound effects (worse than baseline) off medication: no    Anxiousness: no    Dizziness: no    Tics: no  ANXIETY/STRESS Duration:having some trouble sleeping- doing better with seroquel Anxious mood: yes  Excessive worrying: no Irritability: yes  Sweating: no Nausea: yes Palpitations:no Hyperventilation: no Panic attacks: no Agoraphobia: no  Obscessions/compulsions: no Depressed mood: no Depression screen [facility] 2/9 01/06/2019 05/12/2018 03/30/2018 03/04/2018 02/26/2018  Decreased Interest 0 0 0 1 1  Down, Depressed, Hopeless 0 0 0 0 0  PHQ - 2 Score 0 0 0 1 1  Altered sleeping - 0 0 0 0  Tired, decreased energy 3 0 1 2 3   Change in appetite 2 0 2 1 0  Feeling bad or failure about yourself  0 0 0 0 0  Trouble concentrating 2 1 2 3 3   Moving slowly or fidgety/restless 1 0 0  0 0  Suicidal thoughts 0 0 0 0 0  PHQ-9 Score - 1 5 7 7   Difficult doing work/chores - Not difficult at all Somewhat difficult Somewhat difficult -   GAD 7 : Generalized Anxiety Score 01/06/2019 05/12/2018 03/30/2018 03/04/2018  Nervous, Anxious, on Edge 1 0 0 1  Control/stop worrying 0 0 0 2  Worry too much - different things 1 0 1 2  Trouble relaxing 2 0 0 0  Restless 0 0 0 0  Easily annoyed or irritable 2 1 0 1  Afraid - awful might happen 2 0 0 2  Total GAD 7 Score 8 1 1 8   Anxiety Difficulty - Not difficult at all Somewhat difficult Very difficult   Anhedonia: no Weight changes: no Insomnia: yes hard to fall asleep  Hypersomnia: no Fatigue/loss of energy: yes Feelings of worthlessness: no Feelings of guilt: no Impaired concentration/indecisiveness: no Suicidal ideations: no  Crying spells: no Recent Stressors/Life Changes: no   Relationship problems: no   Family stress: no     Financial stress: no    Job stress: no    Recent death/loss: no  HYPERTENSION Hypertension status: controlled  Satisfied with current treatment? yes Duration of hypertension: chronic BP monitoring frequency:  not checking BP medication side effects:  no Medication compliance: excellent compliance Previous BP meds: triamterine-HCTZ Aspirin: no Recurrent headaches: no  Visual changes: no Palpitations: no Dyspnea: no Chest pain: no Lower extremity edema: no Dizzy/lightheaded: no  Relevant past medical, surgical, family and social history reviewed and updated as indicated. Interim medical history since our last visit reviewed. Allergies and medications reviewed and updated.  Review of Systems  Constitutional: Negative.   Respiratory: Negative.   Cardiovascular: Negative.   Neurological: Negative.   Psychiatric/Behavioral: Negative.     Per HPI unless specifically indicated above     Objective:    BP 125/83   Pulse 97   Temp 99.1 F (37.3 C) (Oral)   Ht 5' 4"  (1.626 m)   Wt 210 lb  (95.3 kg)   BMI 36.05 kg/m   Wt Readings from Last 3 Encounters:  01/06/19 210 lb (95.3 kg)  12/10/18 204 lb (92.5 kg)  10/29/18 203 lb (92.1 kg)    Physical Exam Vitals signs and nursing note reviewed.  Constitutional:      General: She is not in acute distress.    Appearance: Normal appearance. She is not ill-appearing, toxic-appearing or diaphoretic.  HENT:     Head: Normocephalic and atraumatic.     Right Ear: External ear normal.     Left Ear: External ear normal.     Nose: Nose normal.     Mouth/Throat:     Mouth: Mucous membranes are moist.     Pharynx: Oropharynx is clear.  Eyes:     General: No scleral icterus.       Right eye: No discharge.        Left eye: No discharge.     Extraocular Movements: Extraocular movements intact.     Conjunctiva/sclera: Conjunctivae normal.     Pupils: Pupils are equal, round, and reactive to light.  Neck:     Musculoskeletal: Normal range of motion and neck supple.  Cardiovascular:     Rate and Rhythm: Normal rate and regular rhythm.     Pulses: Normal pulses.     Heart sounds: Normal heart sounds. No murmur. No friction rub. No gallop.   Pulmonary:     Effort: Pulmonary effort is normal. No respiratory distress.     Breath sounds: Normal breath sounds. No stridor. No wheezing, rhonchi or rales.  Chest:     Chest wall: No tenderness.  Musculoskeletal: Normal range of motion.  Skin:    General: Skin is warm and dry.     Capillary Refill: Capillary refill takes less than 2 seconds.     Coloration: Skin is not jaundiced or pale.     Findings: No bruising, erythema, lesion or rash.  Neurological:     General: No focal deficit present.     Mental Status: She is alert and oriented to person, place, and time. Mental status is at baseline.  Psychiatric:        Mood and Affect: Mood normal.        Behavior: Behavior normal.        Thought Content: Thought content normal.        Judgment: Judgment normal.     Results for orders  placed or performed in visit on 10/29/18  CBC With Differential/Platelet  Result Value Ref Range   WBC 10.3 3.4 - 10.8 x10E3/uL   RBC 3.87 3.77 - 5.28 x10E6/uL   Hemoglobin 12.2 11.1 - 15.9 g/dL   Hematocrit 36.8 34.0 - 46.6 %   MCV 95 79 - 97 fL   MCH 31.5 26.6 - 33.0 pg   MCHC 33.2 31.5 -  35.7 g/dL   RDW 14.6 12.3 - 15.4 %   Platelets 382 150 - 450 x10E3/uL   Neutrophils 68 Not Estab. %   Lymphs 29 Not Estab. %   MID 4 Not Estab. %   Neutrophils Absolute 7.0 1.4 - 7.0 x10E3/uL   Lymphocytes Absolute 2.9 0.7 - 3.1 x10E3/uL   MID (Absolute) 0.4 0.1 - 1.6 X10E3/uL      Assessment & Plan:   Problem List Items Addressed This Visit      Cardiovascular and Mediastinum   Essential hypertension    Under good control on current regimen. Continue current regimen. Continue to monitor. Call with any concerns. Refills given. Labs drawn today.         Other   Depression    Restarted her seroquel for sleep. Needs refill. Stable. Does not want to go back on lexapro. Continue to monitor.       Generalized anxiety disorder with panic attacks    Restarted her seroquel for sleep. Needs refill. Stable. Does not want to go back on lexapro. Continue to monitor.       Morbid obesity ([place])    Due to BMI>35 with depression and HTN. Will work on diet and exercise with goal of losing 1-2lbs a week. Call with any concerns.       Attention deficit disorder (ADD) in adult - Primary    Stable on current regimen. Refills given today for 3 months. Call with any concerns.           Follow up plan: Return After May 3 for physical, 3 months for follow up ADD.

## 2019-01-11 ENCOUNTER — Telehealth: Payer: Self-pay

## 2019-01-11 NOTE — Telephone Encounter (Signed)
Prior Authorization initiated for Seroquel with medicaid. [place] Approved.  Confirmation #: [id number] Prior Approval #: [id number]

## 2019-01-13 ENCOUNTER — Ambulatory Visit: Payer: Medicaid Other | Attending: Internal Medicine

## 2019-01-26 ENCOUNTER — Encounter: Payer: Self-pay | Admitting: Family Medicine

## 2019-01-31 ENCOUNTER — Encounter: Payer: Self-pay | Admitting: Family Medicine

## 2019-01-31 NOTE — Telephone Encounter (Signed)
First message was sent this am, should be in your box

## 2019-01-31 NOTE — Telephone Encounter (Signed)
Please get scheduled for virtual visit for return to work note.

## 2019-01-31 NOTE — Telephone Encounter (Signed)
Copied from [place] [id number]. Topic: General - Other >> Jan 31, 2019  1:45 PM Leward Quan A wrote: Reason for CRM: Patient called to say that she does need a note for work but need to know if she need to be seen before. Asking for a call back regarding this matter Ph# [phone]

## 2019-01-31 NOTE — Telephone Encounter (Signed)
Tried to reach pt to schedule appt for 4/7 @ 11:15 appt on hold Virtual appt

## 2019-02-01 ENCOUNTER — Encounter: Payer: Self-pay | Admitting: Family Medicine

## 2019-02-01 ENCOUNTER — Ambulatory Visit (INDEPENDENT_AMBULATORY_CARE_PROVIDER_SITE_OTHER): Payer: Medicaid Other | Admitting: Family Medicine

## 2019-02-01 ENCOUNTER — Other Ambulatory Visit: Payer: Self-pay

## 2019-02-01 DIAGNOSIS — J069 Acute upper respiratory infection, unspecified: Secondary | ICD-10-CM

## 2019-02-01 NOTE — Progress Notes (Signed)
There were no vitals taken for this visit.   Subjective:    Patient ID: Cheryl Flowers, female    DOB: [birth year], 38 y.o.   MRN: [id number]  HPI: Cheryl Flowers is a 38 y.o. female  Chief Complaint  Patient presents with  . Follow-up    Return to work note   Has been feeling better. Has not had a fever since Thursday night, has been running in the 99s since Sunday, but is still taking tylenol and ibuprofen. She is otherwise feeling well. She has not been feeling short of breath. No coughing. No more diarrhea. She has no other concerns or complaints at this time.   Relevant past medical, surgical, family and social history reviewed and updated as indicated. Interim medical history since our last visit reviewed. Allergies and medications reviewed and updated.  Review of Systems  Constitutional: Positive for fatigue and fever. Negative for activity change, appetite change, chills, diaphoresis and unexpected weight change.  Respiratory: Negative.   Cardiovascular: Negative.   Gastrointestinal: Negative.   Psychiatric/Behavioral: Negative.     Per HPI unless specifically indicated above     Objective:    There were no vitals taken for this visit.  Wt Readings from Last 3 Encounters:  01/06/19 210 lb (95.3 kg)  12/10/18 204 lb (92.5 kg)  10/29/18 203 lb (92.1 kg)    Physical Exam Vitals signs and nursing note reviewed.  Constitutional:      General: She is not in acute distress.    Appearance: Normal appearance. She is not ill-appearing, toxic-appearing or diaphoretic.  HENT:     Head: Normocephalic and atraumatic.     Right Ear: External ear normal.     Left Ear: External ear normal.     Nose: Nose normal.     Mouth/Throat:     Mouth: Mucous membranes are moist.     Pharynx: Oropharynx is clear.  Eyes:     General: No scleral icterus.       Right eye: No discharge.        Left eye: No discharge.     Conjunctiva/sclera: Conjunctivae normal.   Pupils: Pupils are equal, round, and reactive to light.  Neck:     Musculoskeletal: Normal range of motion.  Pulmonary:     Effort: Pulmonary effort is normal. No respiratory distress.     Comments: Speaking in full sentences Musculoskeletal: Normal range of motion.  Skin:    Coloration: Skin is not jaundiced or pale.     Findings: No bruising, erythema, lesion or rash.  Neurological:     Mental Status: She is alert and oriented to person, place, and time. Mental status is at baseline.  Psychiatric:        Mood and Affect: Mood normal.        Behavior: Behavior normal.        Thought Content: Thought content normal.        Judgment: Judgment normal.     Results for orders placed or performed in visit on 10/29/18  CBC With Differential/Platelet  Result Value Ref Range   WBC 10.3 3.4 - 10.8 x10E3/uL   RBC 3.87 3.77 - 5.28 x10E6/uL   Hemoglobin 12.2 11.1 - 15.9 g/dL   Hematocrit 36.8 34.0 - 46.6 %   MCV 95 79 - 97 fL   MCH 31.5 26.6 - 33.0 pg   MCHC 33.2 31.5 - 35.7 g/dL   RDW 14.6 12.3 - 15.4 %   Platelets 382  150 - 450 x10E3/uL   Neutrophils 68 Not Estab. %   Lymphs 29 Not Estab. %   MID 4 Not Estab. %   Neutrophils Absolute 7.0 1.4 - 7.0 x10E3/uL   Lymphocytes Absolute 2.9 0.7 - 3.1 x10E3/uL   MID (Absolute) 0.4 0.1 - 1.6 X10E3/uL      Assessment & Plan:   Problem List Items Addressed This Visit      Respiratory   Viral upper respiratory tract infection - Primary    NOT cleared to go back to work given continued fevers. Will check in again on Friday to see how she's doing. Needs >72 hours with no fevers not on any anti-pyretics. Call with any concerns. Continue to monitor.          Follow up plan: Return Friday.    . This visit was completed via FaceTime due to the restrictions of the COVID-19 pandemic. All issues as above were discussed and addressed. Physical exam was done as above through visual confirmation on FaceTime. If it was felt that the patient  should be evaluated in the office, they were directed there. The patient verbally consented to this visit. . Location of the patient: home . Location of the provider: work . Those involved with this call:  . Provider: Park Liter, DO . CMA: Tiffany Reel, CMA . Front Desk/Registration: Don Perking  . Time spent on call: 10 minutes with patient face to face via video conference. More than 50% of this time was spent in counseling and coordination of care.

## 2019-02-01 NOTE — Assessment & Plan Note (Signed)
NOT cleared to go back to work given continued fevers. Will check in again on Friday to see how she's doing. Needs >72 hours with no fevers not on any anti-pyretics. Call with any concerns. Continue to monitor.

## 2019-02-04 ENCOUNTER — Encounter: Payer: Self-pay | Admitting: Family Medicine

## 2019-02-04 ENCOUNTER — Ambulatory Visit (INDEPENDENT_AMBULATORY_CARE_PROVIDER_SITE_OTHER): Payer: Medicaid Other | Admitting: Family Medicine

## 2019-02-04 ENCOUNTER — Other Ambulatory Visit: Payer: Self-pay

## 2019-02-04 DIAGNOSIS — J069 Acute upper respiratory infection, unspecified: Secondary | ICD-10-CM

## 2019-02-04 NOTE — Assessment & Plan Note (Signed)
Resolved. No fevers since Tuesday. OK to return to work on Monday. Note written.

## 2019-02-04 NOTE — Progress Notes (Signed)
BP 136/84   Pulse 96   Temp 98.5 F (36.9 C) (Oral)    Subjective:    Patient ID: Cheryl Flowers, female    DOB: [birth year], 38 y.o.   MRN: [id number]  HPI: Cheryl Flowers is a 38 y.o. female  Chief Complaint  Patient presents with  . URI    f/up for release note for work   Feeling a little stuffy. Cough is gone. Temp has been running  In the 98s. No tylenol or ibuprofen since Wednesday. No fevers. No chills. She is feeling much much better. No other concerns or complaints at this time.   Relevant past medical, surgical, family and social history reviewed and updated as indicated. Interim medical history since our last visit reviewed. Allergies and medications reviewed and updated.  Review of Systems  Constitutional: Negative.   Respiratory: Negative.   Cardiovascular: Negative.   Musculoskeletal: Negative.   Neurological: Negative.   Psychiatric/Behavioral: Negative.     Per HPI unless specifically indicated above     Objective:    BP 136/84   Pulse 96   Temp 98.5 F (36.9 C) (Oral)   Wt Readings from Last 3 Encounters:  01/06/19 210 lb (95.3 kg)  12/10/18 204 lb (92.5 kg)  10/29/18 203 lb (92.1 kg)    Physical Exam Vitals signs and nursing note reviewed.  Constitutional:      General: She is not in acute distress.    Appearance: Normal appearance. She is not ill-appearing, toxic-appearing or diaphoretic.  HENT:     Head: Normocephalic and atraumatic.     Right Ear: External ear normal.     Left Ear: External ear normal.     Nose: Nose normal.     Mouth/Throat:     Mouth: Mucous membranes are moist.     Pharynx: Oropharynx is clear.  Eyes:     General: No scleral icterus.       Right eye: No discharge.        Left eye: No discharge.     Conjunctiva/sclera: Conjunctivae normal.     Pupils: Pupils are equal, round, and reactive to light.  Neck:     Musculoskeletal: Normal range of motion.  Pulmonary:     Effort: Pulmonary effort  is normal. No respiratory distress.     Comments: Speaking in full sentences Musculoskeletal: Normal range of motion.  Skin:    Coloration: Skin is not jaundiced or pale.     Findings: No bruising, erythema, lesion or rash.  Neurological:     Mental Status: She is alert and oriented to person, place, and time. Mental status is at baseline.  Psychiatric:        Mood and Affect: Mood normal.        Behavior: Behavior normal.        Thought Content: Thought content normal.        Judgment: Judgment normal.     Results for orders placed or performed in visit on 10/29/18  CBC With Differential/Platelet  Result Value Ref Range   WBC 10.3 3.4 - 10.8 x10E3/uL   RBC 3.87 3.77 - 5.28 x10E6/uL   Hemoglobin 12.2 11.1 - 15.9 g/dL   Hematocrit 36.8 34.0 - 46.6 %   MCV 95 79 - 97 fL   MCH 31.5 26.6 - 33.0 pg   MCHC 33.2 31.5 - 35.7 g/dL   RDW 14.6 12.3 - 15.4 %   Platelets 382 150 - 450 x10E3/uL   Neutrophils 68  Not Estab. %   Lymphs 29 Not Estab. %   MID 4 Not Estab. %   Neutrophils Absolute 7.0 1.4 - 7.0 x10E3/uL   Lymphocytes Absolute 2.9 0.7 - 3.1 x10E3/uL   MID (Absolute) 0.4 0.1 - 1.6 X10E3/uL      Assessment & Plan:   Problem List Items Addressed This Visit      Respiratory   Viral upper respiratory tract infection    Resolved. No fevers since Tuesday. OK to return to work on Monday. Note written.          Follow up plan: Return As scheduled.    . This visit was completed via FaceTime due to the restrictions of the COVID-19 pandemic. All issues as above were discussed and addressed. Physical exam was done as above through visual confirmation on FaceTime. If it was felt that the patient should be evaluated in the office, they were directed there. The patient verbally consented to this visit. . Location of the patient: home . Location of the provider: home . Those involved with this call:  . Provider: Park Liter, DO . CMA: Yvonna Alanis, [place] . Front  Desk/Registration: Don Perking  . Time spent on call: 10 minutes with patient face to face via video conference. More than 50% of this time was spent in counseling and coordination of care. 20 minutes total spent in review of patient's record and preparation of their chart.

## 2019-02-07 ENCOUNTER — Other Ambulatory Visit: Payer: Self-pay | Admitting: Family Medicine

## 2019-02-07 NOTE — Telephone Encounter (Signed)
Rx should have dropped at the pharmacy today and in a month- based on refill it looks like they did. Unclear why I'm being asked to refill them?

## 2019-02-08 ENCOUNTER — Other Ambulatory Visit: Payer: Self-pay | Admitting: Family Medicine

## 2019-02-08 NOTE — Telephone Encounter (Signed)
Called [facility], they have 2 prescriptions on file for the patient's Adderall. One is ready to be filled, the second in a month. Called patient and left a VM letting her know this and that she should be able to pick one up.

## 2019-02-17 ENCOUNTER — Encounter: Payer: Self-pay | Admitting: Family Medicine

## 2019-02-23 NOTE — Telephone Encounter (Signed)
Please send note to fax number above.

## 2019-02-23 NOTE — Telephone Encounter (Signed)
Patient returned to work today. Letter is for patient to return to work tomorrow.

## 2019-03-07 ENCOUNTER — Telehealth: Payer: Self-pay

## 2019-03-07 NOTE — Telephone Encounter (Signed)
Called and spoke to patient. She has not had her sleep study yet, so we will cx this apt and r/s after she has sleep study. Patient states she is doing well otherwise.

## 2019-03-10 ENCOUNTER — Encounter: Payer: Medicaid Other | Admitting: Family Medicine

## 2019-03-11 ENCOUNTER — Ambulatory Visit: Payer: Medicaid Other | Admitting: Internal Medicine

## 2019-03-11 ENCOUNTER — Other Ambulatory Visit: Payer: Self-pay | Admitting: Family Medicine

## 2019-04-04 ENCOUNTER — Encounter: Payer: Self-pay | Admitting: Family Medicine

## 2019-04-05 ENCOUNTER — Other Ambulatory Visit: Payer: Self-pay

## 2019-04-05 ENCOUNTER — Encounter: Payer: Self-pay | Admitting: Family Medicine

## 2019-04-05 ENCOUNTER — Ambulatory Visit (INDEPENDENT_AMBULATORY_CARE_PROVIDER_SITE_OTHER): Payer: Commercial Managed Care - PPO | Admitting: Family Medicine

## 2019-04-05 VITALS — BP 137/86 | HR 125 | Temp 98.7°F | Ht 64.5 in | Wt 205.0 lb

## 2019-04-05 DIAGNOSIS — K921 Melena: Secondary | ICD-10-CM

## 2019-04-05 DIAGNOSIS — M2550 Pain in unspecified joint: Secondary | ICD-10-CM

## 2019-04-05 DIAGNOSIS — M545 Low back pain, unspecified: Secondary | ICD-10-CM

## 2019-04-05 DIAGNOSIS — F988 Other specified behavioral and emotional disorders with onset usually occurring in childhood and adolescence: Secondary | ICD-10-CM

## 2019-04-05 DIAGNOSIS — I10 Essential (primary) hypertension: Secondary | ICD-10-CM | POA: Diagnosis not present

## 2019-04-05 DIAGNOSIS — F331 Major depressive disorder, recurrent, moderate: Secondary | ICD-10-CM | POA: Diagnosis not present

## 2019-04-05 DIAGNOSIS — Z0000 Encounter for general adult medical examination without abnormal findings: Secondary | ICD-10-CM

## 2019-04-05 DIAGNOSIS — F411 Generalized anxiety disorder: Secondary | ICD-10-CM

## 2019-04-05 DIAGNOSIS — F410 Panic disorder [episodic paroxysmal anxiety] without agoraphobia: Secondary | ICD-10-CM

## 2019-04-05 DIAGNOSIS — R609 Edema, unspecified: Secondary | ICD-10-CM

## 2019-04-05 DIAGNOSIS — M25561 Pain in right knee: Secondary | ICD-10-CM

## 2019-04-05 DIAGNOSIS — R002 Palpitations: Secondary | ICD-10-CM

## 2019-04-05 DIAGNOSIS — G8929 Other chronic pain: Secondary | ICD-10-CM

## 2019-04-05 LAB — UA/M W/RFLX CULTURE, ROUTINE
Bilirubin, UA: NEGATIVE
Glucose, UA: NEGATIVE
Leukocytes,UA: NEGATIVE
Nitrite, UA: NEGATIVE
Protein,UA: NEGATIVE
Specific Gravity, UA: 1.015 (ref 1.005–1.030)
Urobilinogen, Ur: 1 mg/dL (ref 0.2–1.0)
pH, UA: 7 (ref 5.0–7.5)

## 2019-04-05 LAB — MICROSCOPIC EXAMINATION

## 2019-04-05 LAB — MICROALBUMIN, URINE WAIVED
Creatinine, Urine Waived: 200 mg/dL (ref 10–300)
Microalb, Ur Waived: 30 mg/L — ABNORMAL HIGH (ref 0–19)
Microalb/Creat Ratio: <30 mg/g (ref <30)

## 2019-04-05 LAB — BAYER DCA HB A1C WAIVED: HB A1C (BAYER DCA - WAIVED): 5.1 % (ref <7.0)

## 2019-04-05 MED ORDER — AMPHETAMINE-DEXTROAMPHET ER 15 MG PO CP24: 15.0000 mg | ORAL_CAPSULE | ORAL | 0 refills | Status: DC

## 2019-04-05 MED ORDER — AMPHETAMINE-DEXTROAMPHET ER 15 MG PO CP24: 15.0000 mg | ORAL_CAPSULE | Freq: Every day | ORAL | 0 refills | Status: DC

## 2019-04-05 NOTE — Progress Notes (Signed)
BP 137/86   Pulse (!) 125   Temp 98.7 F (37.1 C) (Oral)   Ht 5' 4.5" (1.638 m)   Wt 205 lb (93 kg)   SpO2 100%   BMI 34.64 kg/m    Subjective:    Patient ID: Cheryl Flowers, female    DOB: Oct 03, 1981, 38 y.o.   MRN: [id number]  HPI: Cheryl Flowers is a 38 y.o. female presenting on 04/05/2019 for comprehensive medical examination. Current medical complaints include:  ANXIETY/DEPRESSION- just started with psychiatry. They started cymbalta and stopped seroquel. She is not doing well. Not feeling well. Just started the new medicine about 10 days ago.  Duration:exacerbated Anxious mood: yes  Excessive worrying: yes Irritability: yes  Sweating: no Nausea: yes Palpitations:yes Hyperventilation: yes Panic attacks: yes Agoraphobia: no  Obscessions/compulsions: no Depressed mood: yes Depression screen [facility] 2/9 01/06/2019 05/12/2018 03/30/2018 03/04/2018 02/26/2018  Decreased Interest 0 0 0 1 1  Down, Depressed, Hopeless 0 0 0 0 0  PHQ - 2 Score 0 0 0 1 1  Altered sleeping - 0 0 0 0  Tired, decreased energy 3 0 1 2 3   Change in appetite 2 0 2 1 0  Feeling bad or failure about yourself  0 0 0 0 0  Trouble concentrating 2 1 2 3 3   Moving slowly or fidgety/restless 1 0 0 0 0  Suicidal thoughts 0 0 0 0 0  PHQ-9 Score - 1 5 7 7   Difficult doing work/chores - Not difficult at all Somewhat difficult Somewhat difficult -   Anhedonia: no Weight changes: no Insomnia: yes hard to fall asleep  Hypersomnia: no Fatigue/loss of energy: yes Feelings of worthlessness: no Feelings of guilt: no Impaired concentration/indecisiveness: yes Suicidal ideations: no  Crying spells: yes Recent Stressors/Life Changes: yes   Relationship problems: no   Family stress: yes     Financial stress: yes    Job stress: yes    Recent death/loss: no  ADHD FOLLOW UP ADHD status: stable Satisfied with current therapy: no Medication compliance:  good compliance Controlled substance contract:  yes Previous psychiatry evaluation: yes Previous medications: no adderall   Taking meds on weekends/vacations: yes Work/school performance:  on leave right now Difficulty sustaining attention/completing tasks: yes Distracted by extraneous stimuli: yes Does not listen when spoken to: yes  Fidgets with hands or feet: no Unable to stay in seat: no Blurts out/interrupts others: no ADHD Medication Side Effects: no    Decreased appetite: no    Headache: no    Sleeping disturbance pattern: yes    Irritability: yes    Rebound effects (worse than baseline) off medication: no    Anxiousness: yes    Dizziness: no    Tics: no  ARTHRALGIAS / JOINT ACHES Duration: months Pain: yes Symmetric: no Severity: severe Quality: sore and aching Frequency: constant Context:  worse Decreased function/range of motion: yes  Erythema: no Swelling: yes Heat or warmth: no Morning stiffness: yes Relief with NSAIDs?: moderate Treatments attempted:  rest, heat and aleve  Involved Joints:     Hands: yes bilateral    Wrists: yes right     Elbows: no     Shoulders: yes right    Back: yes     Hips: yes right    Knees: yes right    Ankles: no     Feet:  no  Menopausal Symptoms: yes- night sweats  Depression Screen done today and results listed below:  Depression screen [facility] 2/9 01/06/2019 05/12/2018  03/30/2018 03/04/2018 02/26/2018  Decreased Interest 0 0 0 1 1  Down, Depressed, Hopeless 0 0 0 0 0  PHQ - 2 Score 0 0 0 1 1  Altered sleeping - 0 0 0 0  Tired, decreased energy 3 0 1 2 3   Change in appetite 2 0 2 1 0  Feeling bad or failure about yourself  0 0 0 0 0  Trouble concentrating 2 1 2 3 3   Moving slowly or fidgety/restless 1 0 0 0 0  Suicidal thoughts 0 0 0 0 0  PHQ-9 Score - 1 5 7 7   Difficult doing work/chores - Not difficult at all Somewhat difficult Somewhat difficult -    Past Medical History:  Past Medical History:  Diagnosis Date  . Anxiety   . Depression   . Low back pain   .  Marital conflict   . Panic disorder   . Thyromegaly     Surgical History:  Past Surgical History:  Procedure Laterality Date  . CESAREAN SECTION  2003 and 2010  . CHOLECYSTECTOMY  2007  . TUBAL LIGATION  2010    Medications:  Current Outpatient Medications on File Prior to Visit  Medication Sig  . cyclobenzaprine (FLEXERIL) 10 MG tablet TAKE 1 TABLET BY MOUTH THREE TIMES A DAY AS NEEDED FOR MUSCLE SPASMS  . DULoxetine HCl 60 MG CSDR   . hydrOXYzine (ATARAX/VISTARIL) 25 MG tablet   . nicotine (NICODERM CQ - DOSED IN MG/24 HOURS) 21 mg/24hr patch Place 1 patch (21 mg total) onto the skin daily.  . traZODone (DESYREL) 50 MG tablet   . triamterene-hydrochlorothiazide (MAXZIDE-25) 37.5-25 MG tablet Take 1 tablet by mouth daily.   No current facility-administered medications on file prior to visit.     Allergies:  Allergies  Allergen Reactions  . Morphine And Related Itching  . Sulfa Antibiotics Hives    Social History:  Social History   Socioeconomic History  . Marital status: Legally Separated    Spouse name: Not on file  . Number of children: Not on file  . Years of education: Not on file  . Highest education level: Not on file  Occupational History  . Not on file  Social Needs  . Financial resource strain: Not on file  . Food insecurity:    Worry: Not on file    Inability: Not on file  . Transportation needs:    Medical: Not on file    Non-medical: Not on file  Tobacco Use  . Smoking status: Current Every Day Smoker    Packs/day: 0.25    Years: 19.00    Pack years: 4.75    Types: Cigarettes    Last attempt to quit: 01/09/2019    Years since quitting: 0.2  . Smokeless tobacco: Never Used  Substance and Sexual Activity  . Alcohol use: Not Currently  . Drug use: No  . Sexual activity: Not on file  Lifestyle  . Physical activity:    Days per week: Not on file    Minutes per session: Not on file  . Stress: Not on file  Relationships  . Social  connections:    Talks on phone: Not on file    Gets together: Not on file    Attends religious service: Not on file    Active member of club or organization: Not on file    Attends meetings of clubs or organizations: Not on file    Relationship status: Not on file  . Intimate partner  violence:    Fear of current or ex partner: Not on file    Emotionally abused: Not on file    Physically abused: Not on file    Forced sexual activity: Not on file  Other Topics Concern  . Not on file  Social History Narrative  . Not on file   Social History   Tobacco Use  Smoking Status Current Every Day Smoker  . Packs/day: 0.25  . Years: 19.00  . Pack years: 4.75  . Types: Cigarettes  . Last attempt to quit: 01/09/2019  . Years since quitting: 0.2  Smokeless Tobacco Never Used   Social History   Substance and Sexual Activity  Alcohol Use Not Currently    Family History:  Family History  Problem Relation Age of Onset  . Hypertension Mother   . Rheum arthritis Mother   . Diabetes Father     Past medical history, surgical history, medications, allergies, family history and social history reviewed with patient today and changes made to appropriate areas of the chart.   Review of Systems  Constitutional: Positive for chills and malaise/fatigue. Negative for diaphoresis, fever and weight loss.  HENT: Negative.   Eyes: Negative.   Respiratory: Positive for cough (at night) and shortness of breath. Negative for hemoptysis, sputum production and wheezing.   Cardiovascular: Positive for palpitations and leg swelling. Negative for chest pain, orthopnea, claudication and PND.  Gastrointestinal: Positive for abdominal pain, blood in stool, diarrhea, nausea (2x with panic attacks) and vomiting (2x with panic attacks). Negative for constipation, heartburn and melena.  Genitourinary: Negative.   Musculoskeletal: Positive for back pain, joint pain and myalgias. Negative for falls and neck pain.   Skin: Positive for rash. Negative for itching.  Neurological: Positive for dizziness and headaches. Negative for tingling, tremors, sensory change, speech change, focal weakness, seizures, loss of consciousness and weakness.  Endo/Heme/Allergies: Positive for polydipsia. Negative for environmental allergies. Bruises/bleeds easily.  Psychiatric/Behavioral: Positive for depression. Negative for hallucinations, memory loss, substance abuse and suicidal ideas. The patient is nervous/anxious and has insomnia.     All other ROS negative except what is listed above and in the HPI.      Objective:    BP 137/86   Pulse (!) 125   Temp 98.7 F (37.1 C) (Oral)   Ht 5' 4.5" (1.638 m)   Wt 205 lb (93 kg)   SpO2 100%   BMI 34.64 kg/m   Wt Readings from Last 3 Encounters:  04/05/19 205 lb (93 kg)  01/06/19 210 lb (95.3 kg)  12/10/18 204 lb (92.5 kg)    Physical Exam Vitals signs and nursing note reviewed.  Constitutional:      General: She is not in acute distress.    Appearance: Normal appearance. She is not ill-appearing, toxic-appearing or diaphoretic.  HENT:     Head: Normocephalic and atraumatic.     Right Ear: Tympanic membrane, ear canal and external ear normal. There is no impacted cerumen.     Left Ear: Tympanic membrane, ear canal and external ear normal. There is no impacted cerumen.     Nose: Nose normal. No congestion or rhinorrhea.     Mouth/Throat:     Mouth: Mucous membranes are moist.     Pharynx: Oropharynx is clear. No oropharyngeal exudate or posterior oropharyngeal erythema.  Eyes:     General: No scleral icterus.       Right eye: No discharge.        Left eye: No discharge.  Extraocular Movements: Extraocular movements intact.     Conjunctiva/sclera: Conjunctivae normal.     Pupils: Pupils are equal, round, and reactive to light.  Neck:     Musculoskeletal: Normal range of motion and neck supple. No neck rigidity or muscular tenderness.     Vascular: No  carotid bruit.  Cardiovascular:     Rate and Rhythm: Normal rate and regular rhythm.     Pulses: Normal pulses.     Heart sounds: No murmur. No friction rub. No gallop.   Pulmonary:     Effort: Pulmonary effort is normal. No respiratory distress.     Breath sounds: Normal breath sounds. No stridor. No wheezing, rhonchi or rales.  Chest:     Chest wall: No tenderness.  Abdominal:     General: Abdomen is flat. Bowel sounds are normal. There is no distension.     Palpations: Abdomen is soft. There is no mass.     Tenderness: There is no abdominal tenderness. There is no right CVA tenderness, left CVA tenderness, guarding or rebound.     Hernia: No hernia is present.  Genitourinary:    Comments: Breast and pelvic exams deferred with shared decision making Musculoskeletal:        General: No swelling, tenderness, deformity or signs of injury.     Right lower leg: No edema.     Left lower leg: No edema.  Lymphadenopathy:     Cervical: No cervical adenopathy.  Skin:    General: Skin is warm and dry.     Capillary Refill: Capillary refill takes less than 2 seconds.     Coloration: Skin is not jaundiced or pale.     Findings: No bruising, erythema, lesion or rash.  Neurological:     General: No focal deficit present.     Mental Status: She is alert and oriented to person, place, and time. Mental status is at baseline.     Cranial Nerves: No cranial nerve deficit.     Sensory: No sensory deficit.     Motor: Tremor present. No weakness.     Coordination: Coordination normal.     Gait: Gait normal.     Deep Tendon Reflexes: Reflexes normal.  Psychiatric:        Mood and Affect: Mood is anxious and depressed.        Behavior: Behavior normal.        Thought Content: Thought content normal.        Judgment: Judgment normal.     Results for orders placed or performed in visit on 04/05/19  Microscopic Examination  Result Value Ref Range   WBC, UA 0-5 0 - 5 /hpf   RBC 11-30 (A) 0 - 2  /hpf   Epithelial Cells (non renal) 0-10 0 - 10 /hpf   Mucus, UA Present Not Estab.   Bacteria, UA Few (A) None seen/Few  Bayer DCA Hb A1c Waived  Result Value Ref Range   HB A1C (BAYER DCA - WAIVED) 5.1 <7.0 %  CBC with Differential/Platelet  Result Value Ref Range   WBC 9.1 3.4 - 10.8 x10E3/uL   RBC 4.20 3.77 - 5.28 x10E6/uL   Hemoglobin 13.0 11.1 - 15.9 g/dL   Hematocrit 39.4 34.0 - 46.6 %   MCV 94 79 - 97 fL   MCH 31.0 26.6 - 33.0 pg   MCHC 33.0 31.5 - 35.7 g/dL   RDW 12.0 11.7 - 15.4 %   Platelets 336 150 - 450 x10E3/uL   Neutrophils  70 Not Estab. %   Lymphs 24 Not Estab. %   Monocytes 4 Not Estab. %   Eos 2 Not Estab. %   Basos 0 Not Estab. %   Neutrophils Absolute 6.3 1.4 - 7.0 x10E3/uL   Lymphocytes Absolute 2.2 0.7 - 3.1 x10E3/uL   Monocytes Absolute 0.4 0.1 - 0.9 x10E3/uL   EOS (ABSOLUTE) 0.2 0.0 - 0.4 x10E3/uL   Basophils Absolute 0.0 0.0 - 0.2 x10E3/uL   Immature Granulocytes 0 Not Estab. %   Immature Grans (Abs) 0.0 0.0 - 0.1 x10E3/uL  Comprehensive metabolic panel  Result Value Ref Range   Glucose 96 65 - 99 mg/dL   BUN 5 (L) 6 - 20 mg/dL   Creatinine, Ser 0.78 0.57 - 1.00 mg/dL   GFR calc non Af Amer 97 >59 mL/min/1.73   GFR calc Af Amer 112 >59 mL/min/1.73   BUN/Creatinine Ratio 6 (L) 9 - 23   Sodium 141 134 - 144 mmol/L   Potassium 3.9 3.5 - 5.2 mmol/L   Chloride 106 96 - 106 mmol/L   CO2 19 (L) 20 - 29 mmol/L   Calcium 9.1 8.7 - 10.2 mg/dL   Total Protein 7.7 6.0 - 8.5 g/dL   Albumin 4.4 3.8 - 4.8 g/dL   Globulin, Total 3.3 1.5 - 4.5 g/dL   Albumin/Globulin Ratio 1.3 1.2 - 2.2   Bilirubin Total 0.3 0.0 - 1.2 mg/dL   Alkaline Phosphatase 78 39 - 117 IU/L   AST 17 0 - 40 IU/L   ALT 16 0 - 32 IU/L  Lipid Panel w/o Chol/HDL Ratio  Result Value Ref Range   Cholesterol, Total 177 100 - 199 mg/dL   Triglycerides 67 0 - 149 mg/dL   HDL 52 >39 mg/dL   VLDL Cholesterol Cal 13 5 - 40 mg/dL   LDL Calculated 112 (H) 0 - 99 mg/dL  Microalbumin, Urine  Waived  Result Value Ref Range   Microalb, Ur Waived 30 (H) 0 - 19 mg/L   Creatinine, Urine Waived 200 10 - 300 mg/dL   Microalb/Creat Ratio <30 <30 mg/g  TSH  Result Value Ref Range   TSH 0.861 0.450 - 4.500 uIU/mL  UA/M w/rflx Culture, Routine  Result Value Ref Range   Specific Gravity, UA 1.015 1.005 - 1.030   pH, UA 7.0 5.0 - 7.5   Color, UA Yellow Yellow   Appearance Ur Clear Clear   Leukocytes,UA Negative Negative   Protein,UA Negative Negative/Trace   Glucose, UA Negative Negative   Ketones, UA Trace (A) Negative   RBC, UA Trace (A) Negative   Bilirubin, UA Negative Negative   Urobilinogen, Ur 1.0 0.2 - 1.0 mg/dL   Nitrite, UA Negative Negative   Microscopic Examination See below:   VITAMIN D 25 Hydroxy (Vit-D Deficiency, Fractures)  Result Value Ref Range   Vit D, 25-Hydroxy 24.3 (L) 30.0 - 100.0 ng/mL  RA Qn+CCP(IgG/A)+SjoSSA+SjoSSB  Result Value Ref Range   Rhuematoid fact SerPl-aCnc <10.0 0.0 - 13.9 IU/mL   ENA SSA (RO) Ab <0.2 0.0 - 0.9 AI   ENA SSB (LA) Ab <0.2 0.0 - 0.9 AI   Cyclic Citrullin Peptide Ab WILL FOLLOW   Sed Rate (ESR)  Result Value Ref Range   Sed Rate 83 (H) 0 - 32 mm/hr  CK (Creatine Kinase)  Result Value Ref Range   Total CK 89 32 - 182 U/L  Antinuclear Antib (ANA)  Result Value Ref Range   Anti Nuclear Antibody (ANA) Positive (A) Negative  Lyme Ab/Western Blot Reflex  Result Value Ref Range   Lyme IgG/IgM Ab WILL FOLLOW    LYME DISEASE AB, QUANT, IGM WILL FOLLOW   Babesia microti Antibody Panel  Result Value Ref Range   Babesia microti IgM WILL FOLLOW    Babesia microti IgG WILL FOLLOW   Rocky mtn spotted fvr ab, IgG-blood  Result Value Ref Range   RMSF IgG WILL FOLLOW   B Nat Peptide  Result Value Ref Range   BNP WILL FOLLOW   Ehrlichia Antibody Panel  Result Value Ref Range   E.Chaffeensis (HME) IgG WILL FOLLOW    E. Chaffeensis (HME) IgM Titer WILL FOLLOW    HGE IgG Titer WILL FOLLOW    HGE IgM Titer WILL FOLLOW        Assessment & Plan:   Problem List Items Addressed This Visit      Cardiovascular and Mediastinum   Essential hypertension    Under good control on current regimen. Continue current regimen. Continue to monitor. Call with any concerns. Refills given. Labs checked today.         Other   Generalized anxiety disorder with panic attacks    Not doing well. Continue to follow with psychiatry. Call with any concerns. Continue to monitor.       Relevant Medications   DULoxetine HCl 60 MG CSDR   hydrOXYzine (ATARAX/VISTARIL) 25 MG tablet   traZODone (DESYREL) 50 MG tablet   Attention deficit disorder (ADD) in adult    Under good control on current regimen. Continue current regimen. Continue to monitor. Call with any concerns. Refills given for 3 months. Recheck 3 months.        Moderate episode of recurrent major depressive disorder ([place])    Not doing well. Continue to follow with psychiatry. Call with any concerns. Continue to monitor.       Relevant Medications   DULoxetine HCl 60 MG CSDR   hydrOXYzine (ATARAX/VISTARIL) 25 MG tablet   traZODone (DESYREL) 50 MG tablet    Other Visit Diagnoses    Routine general medical examination at a health care facility    -  Primary   Vaccines up to date. Screening labs checked today. Pap up to date. Continue diet and exercise. Call with any concerns. Continue to monitor.    Relevant Orders   Bayer DCA Hb A1c Waived (Completed)   CBC with Differential/Platelet (Completed)   Comprehensive metabolic panel (Completed)   Lipid Panel w/o Chol/HDL Ratio (Completed)   Microalbumin, Urine Waived (Completed)   TSH (Completed)   UA/M w/rflx Culture, Routine (Completed)   Arthralgia, unspecified joint       Will check labs. Await results. Call with any concerns.    Relevant Orders   VITAMIN D 25 Hydroxy (Vit-D Deficiency, Fractures) (Completed)   RA Qn+CCP(IgG/A)+SjoSSA+SjoSSB (Completed)   Sed Rate (ESR) (Completed)   CK (Creatine Kinase)  (Completed)   Antinuclear Antib (ANA) (Completed)   Lyme Ab/Western Blot Reflex (Completed)   Babesia microti Antibody Panel (Completed)   Rocky mtn spotted fvr ab, IgG-blood (Completed)   Ehrlichia Antibody Panel (Completed)   Edema, unspecified type       Will check labs. Await results. Call with any concerns.    Relevant Orders   B Nat Peptide (Completed)   Blood in stool       Will check FOBT. Call with any concerns. Await results.    Relevant Orders   Fecal occult blood, imunochemical([facility])   Palpitations  Will get her set up for holter monitor. Await results.    Relevant Orders   Holter monitor - 48 hour   Chronic pain of right knee       Will obtain x-ray. Await results. Treat as needed.    Relevant Medications   DULoxetine HCl 60 MG CSDR   traZODone (DESYREL) 50 MG tablet   Other Relevant Orders   DG Knee Complete 4 Views Right   Chronic bilateral low back pain without sciatica       Will obtain x-ray. Await results. Treat as needed.    Relevant Orders   DG Lumbar Spine Complete       Follow up plan: Return in about 4 weeks (around 05/03/2019).   LABORATORY TESTING:  - Pap smear: up to date  IMMUNIZATIONS:   - Tdap: Tetanus vaccination status reviewed: last tetanus booster within 10 years. - Influenza: Up to date - Pneumovax: Up to date  PATIENT COUNSELING:   Advised to take 1 mg of folate supplement per day if capable of pregnancy.   Sexuality: Discussed sexually transmitted diseases, partner selection, use of condoms, avoidance of unintended pregnancy  and contraceptive alternatives.   Advised to avoid cigarette smoking.  I discussed with the patient that most people either abstain from alcohol or drink within safe limits (<=14/week and <=4 drinks/occasion for males, <=7/weeks and <= 3 drinks/occasion for females) and that the risk for alcohol disorders and other health effects rises proportionally with the number of drinks per week and how  often a drinker exceeds daily limits.  Discussed cessation/primary prevention of drug use and availability of treatment for abuse.   Diet: Encouraged to adjust caloric intake to maintain  or achieve ideal body weight, to reduce intake of dietary saturated fat and total fat, to limit sodium intake by avoiding high sodium foods and not adding table salt, and to maintain adequate dietary potassium and calcium preferably from fresh fruits, vegetables, and low-fat dairy products.    stressed the importance of regular exercise  Injury prevention: Discussed safety belts, safety helmets, smoke detector, smoking near bedding or upholstery.   Dental health: Discussed importance of regular tooth brushing, flossing, and dental visits.    NEXT PREVENTATIVE PHYSICAL DUE IN 1 YEAR. Return in about 4 weeks (around 05/03/2019).   50 minutes spent with patient with >50% spent in counseling and coordination of care.

## 2019-04-06 ENCOUNTER — Encounter: Payer: Self-pay | Admitting: Family Medicine

## 2019-04-06 DIAGNOSIS — F331 Major depressive disorder, recurrent, moderate: Secondary | ICD-10-CM | POA: Insufficient documentation

## 2019-04-06 NOTE — Assessment & Plan Note (Signed)
Not doing well. Continue to follow with psychiatry. Call with any concerns. Continue to monitor.

## 2019-04-06 NOTE — Assessment & Plan Note (Signed)
Under good control on current regimen. Continue current regimen. Continue to monitor. Call with any concerns. Refills given for 3 months. Recheck 3 months.

## 2019-04-06 NOTE — Assessment & Plan Note (Deleted)
Not doing well. Continue to follow with psychiatry. Call with any concerns. Continue to monitor.

## 2019-04-06 NOTE — Assessment & Plan Note (Signed)
Under good control on current regimen. Continue current regimen. Continue to monitor. Call with any concerns. Refills given. Labs checked today.  

## 2019-04-07 ENCOUNTER — Ambulatory Visit
Admission: RE | Admit: 2019-04-07 | Discharge: 2019-04-07 | Disposition: A | Discharge status: Home / Self Care | Payer: Commercial Managed Care - PPO | Source: Ambulatory Visit | Attending: Family Medicine | Admitting: Family Medicine

## 2019-04-07 ENCOUNTER — Other Ambulatory Visit: Payer: Self-pay

## 2019-04-07 DIAGNOSIS — R002 Palpitations: Secondary | ICD-10-CM | POA: Diagnosis not present

## 2019-04-07 LAB — CBC WITH DIFFERENTIAL/PLATELET
Basophils Absolute: 0 10*3/uL (ref 0.0–0.2)
Basos: 0 %
EOS (ABSOLUTE): 0.2 10*3/uL (ref 0.0–0.4)
Eos: 2 %
Hematocrit: 39.4 % (ref 34.0–46.6)
Hemoglobin: 13 g/dL (ref 11.1–15.9)
Immature Grans (Abs): 0 10*3/uL (ref 0.0–0.1)
Immature Granulocytes: 0 %
Lymphocytes Absolute: 2.2 10*3/uL (ref 0.7–3.1)
Lymphs: 24 %
MCH: 31 pg (ref 26.6–33.0)
MCHC: 33 g/dL (ref 31.5–35.7)
MCV: 94 fL (ref 79–97)
Monocytes Absolute: 0.4 10*3/uL (ref 0.1–0.9)
Monocytes: 4 %
Neutrophils Absolute: 6.3 10*3/uL (ref 1.4–7.0)
Neutrophils: 70 %
Platelets: 336 10*3/uL (ref 150–450)
RBC: 4.2 x10E6/uL (ref 3.77–5.28)
RDW: 12 % (ref 11.7–15.4)
WBC: 9.1 10*3/uL (ref 3.4–10.8)

## 2019-04-07 LAB — RA QN+CCP(IGG/A)+SJOSSA+SJOSSB
Cyclic Citrullin Peptide Ab: 4 units (ref 0–19)
ENA SSA (RO) Ab: <0.2 AI (ref 0.0–0.9)
ENA SSB (LA) Ab: <0.2 AI (ref 0.0–0.9)
Rheumatoid fact SerPl-aCnc: <10 IU/mL (ref 0.0–13.9)

## 2019-04-07 LAB — COMPREHENSIVE METABOLIC PANEL
ALT: 16 IU/L (ref 0–32)
AST: 17 IU/L (ref 0–40)
Albumin/Globulin Ratio: 1.3 (ref 1.2–2.2)
Albumin: 4.4 g/dL (ref 3.8–4.8)
Alkaline Phosphatase: 78 IU/L (ref 39–117)
BUN/Creatinine Ratio: 6 — ABNORMAL LOW (ref 9–23)
BUN: 5 mg/dL — ABNORMAL LOW (ref 6–20)
Bilirubin Total: 0.3 mg/dL (ref 0.0–1.2)
CO2: 19 mmol/L — ABNORMAL LOW (ref 20–29)
Calcium: 9.1 mg/dL (ref 8.7–10.2)
Chloride: 106 mmol/L (ref 96–106)
Creatinine, Ser: 0.78 mg/dL (ref 0.57–1.00)
GFR calc Af Amer: 112 mL/min/{1.73_m2} (ref >59)
GFR calc non Af Amer: 97 mL/min/{1.73_m2} (ref >59)
Globulin, Total: 3.3 g/dL (ref 1.5–4.5)
Glucose: 96 mg/dL (ref 65–99)
Potassium: 3.9 mmol/L (ref 3.5–5.2)
Sodium: 141 mmol/L (ref 134–144)
Total Protein: 7.7 g/dL (ref 6.0–8.5)

## 2019-04-07 LAB — ROCKY MTN SPOTTED FVR AB, IGG-BLOOD

## 2019-04-07 LAB — LIPID PANEL W/O CHOL/HDL RATIO
Cholesterol, Total: 177 mg/dL (ref 100–199)
HDL: 52 mg/dL (ref >39)
LDL Calculated: 112 mg/dL — ABNORMAL HIGH (ref 0–99)
Triglycerides: 67 mg/dL (ref 0–149)
VLDL Cholesterol Cal: 13 mg/dL (ref 5–40)

## 2019-04-07 LAB — TSH: TSH: 0.861 u[IU]/mL (ref 0.450–4.500)

## 2019-04-07 LAB — SEDIMENTATION RATE: Sed Rate: 83 mm/hr — ABNORMAL HIGH (ref 0–32)

## 2019-04-07 LAB — VITAMIN D 25 HYDROXY (VIT D DEFICIENCY, FRACTURES): Vit D, 25-Hydroxy: 24.3 ng/mL — ABNORMAL LOW (ref 30.0–100.0)

## 2019-04-08 ENCOUNTER — Encounter: Payer: Self-pay | Admitting: Family Medicine

## 2019-04-08 DIAGNOSIS — R768 Other specified abnormal immunological findings in serum: Secondary | ICD-10-CM

## 2019-04-08 DIAGNOSIS — R700 Elevated erythrocyte sedimentation rate: Secondary | ICD-10-CM

## 2019-04-08 DIAGNOSIS — M2550 Pain in unspecified joint: Secondary | ICD-10-CM

## 2019-04-08 LAB — CK: Total CK: 89 U/L (ref 32–182)

## 2019-04-08 LAB — ANA: Anti Nuclear Antibody (ANA): POSITIVE — AB

## 2019-04-08 LAB — BRAIN NATRIURETIC PEPTIDE: BNP: <2.5 pg/mL (ref 0.0–100.0)

## 2019-04-08 LAB — EHRLICHIA ANTIBODY PANEL
E. Chaffeensis (HME) IgM Titer: NEGATIVE
E.Chaffeensis (HME) IgG: NEGATIVE
HGE IgG Titer: NEGATIVE
HGE IgM Titer: NEGATIVE

## 2019-04-08 LAB — RMSF, IGG, IFA: RMSF, IGG, IFA: 1:64 {titer} — ABNORMAL HIGH

## 2019-04-08 LAB — ROCKY MTN SPOTTED FVR AB, IGG-BLOOD: RMSF IgG: UNDETERMINED

## 2019-04-08 LAB — BABESIA MICROTI ANTIBODY PANEL
Babesia microti IgG: <1:10 {titer}
Babesia microti IgM: <1:10 {titer}

## 2019-04-08 LAB — LYME AB/WESTERN BLOT REFLEX
LYME DISEASE AB, QUANT, IGM: <0.8 index (ref 0.00–0.79)
Lyme IgG/IgM Ab: <0.91 {ISR} (ref 0.00–0.90)

## 2019-04-11 ENCOUNTER — Ambulatory Visit
Admission: RE | Admit: 2019-04-11 | Discharge: 2019-04-11 | Disposition: A | Discharge status: Home / Self Care | Payer: Commercial Managed Care - PPO | Source: Ambulatory Visit | Attending: Family Medicine | Admitting: Family Medicine

## 2019-04-11 ENCOUNTER — Other Ambulatory Visit: Payer: Self-pay

## 2019-04-11 DIAGNOSIS — M545 Low back pain: Secondary | ICD-10-CM | POA: Insufficient documentation

## 2019-04-11 DIAGNOSIS — M4186 Other forms of scoliosis, lumbar region: Secondary | ICD-10-CM | POA: Diagnosis not present

## 2019-04-11 DIAGNOSIS — G8929 Other chronic pain: Secondary | ICD-10-CM

## 2019-04-11 DIAGNOSIS — M4807 Spinal stenosis, lumbosacral region: Secondary | ICD-10-CM | POA: Diagnosis not present

## 2019-04-11 DIAGNOSIS — M25561 Pain in right knee: Secondary | ICD-10-CM | POA: Diagnosis not present

## 2019-04-11 DIAGNOSIS — M47816 Spondylosis without myelopathy or radiculopathy, lumbar region: Secondary | ICD-10-CM | POA: Diagnosis not present

## 2019-04-11 DIAGNOSIS — M47817 Spondylosis without myelopathy or radiculopathy, lumbosacral region: Secondary | ICD-10-CM | POA: Diagnosis not present

## 2019-04-12 ENCOUNTER — Encounter: Payer: Self-pay | Admitting: Family Medicine

## 2019-04-12 DIAGNOSIS — M4726 Other spondylosis with radiculopathy, lumbar region: Secondary | ICD-10-CM

## 2019-04-12 MED ORDER — DOXYCYCLINE HYCLATE 100 MG PO TABS: 100.0000 mg | ORAL_TABLET | Freq: Two times a day (BID) | ORAL | 0 refills | Status: DC

## 2019-04-12 NOTE — Addendum Note (Signed)
Addended by: [name] on: 04/12/2019 12:59 PM   Modules accepted: Orders

## 2019-04-20 ENCOUNTER — Encounter: Payer: Self-pay | Admitting: Family Medicine

## 2019-04-21 DIAGNOSIS — R700 Elevated erythrocyte sedimentation rate: Secondary | ICD-10-CM | POA: Diagnosis not present

## 2019-04-21 DIAGNOSIS — R768 Other specified abnormal immunological findings in serum: Secondary | ICD-10-CM | POA: Diagnosis not present

## 2019-04-21 DIAGNOSIS — R519 Headache, unspecified: Secondary | ICD-10-CM | POA: Insufficient documentation

## 2019-04-21 DIAGNOSIS — M2550 Pain in unspecified joint: Secondary | ICD-10-CM | POA: Diagnosis not present

## 2019-04-21 DIAGNOSIS — R9389 Abnormal findings on diagnostic imaging of other specified body structures: Secondary | ICD-10-CM | POA: Insufficient documentation

## 2019-04-21 DIAGNOSIS — R7689 Other specified abnormal immunological findings in serum: Secondary | ICD-10-CM | POA: Insufficient documentation

## 2019-04-21 DIAGNOSIS — R51 Headache: Secondary | ICD-10-CM | POA: Diagnosis not present

## 2019-05-02 ENCOUNTER — Encounter: Payer: Self-pay | Admitting: Family Medicine

## 2019-05-05 ENCOUNTER — Ambulatory Visit: Payer: Commercial Managed Care - PPO | Admitting: Family Medicine

## 2019-05-10 ENCOUNTER — Encounter: Payer: Self-pay | Admitting: Family Medicine

## 2019-05-10 ENCOUNTER — Ambulatory Visit (INDEPENDENT_AMBULATORY_CARE_PROVIDER_SITE_OTHER): Payer: Medicaid Other | Admitting: Family Medicine

## 2019-05-10 ENCOUNTER — Other Ambulatory Visit: Payer: Self-pay

## 2019-05-10 VITALS — BP 126/80 | HR 121 | Temp 98.3°F | Ht 64.5 in | Wt 211.0 lb

## 2019-05-10 DIAGNOSIS — N63 Unspecified lump in unspecified breast: Secondary | ICD-10-CM

## 2019-05-10 DIAGNOSIS — F411 Generalized anxiety disorder: Secondary | ICD-10-CM

## 2019-05-10 DIAGNOSIS — F410 Panic disorder [episodic paroxysmal anxiety] without agoraphobia: Secondary | ICD-10-CM

## 2019-05-10 DIAGNOSIS — F331 Major depressive disorder, recurrent, moderate: Secondary | ICD-10-CM

## 2019-05-10 DIAGNOSIS — R000 Tachycardia, unspecified: Secondary | ICD-10-CM | POA: Diagnosis not present

## 2019-05-10 MED ORDER — METOPROLOL SUCCINATE ER 25 MG PO TB24: 25.0000 mg | ORAL_TABLET | Freq: Every day | ORAL | 3 refills | Status: DC

## 2019-05-10 MED ORDER — TRIAMTERENE-HCTZ 37.5-25 MG PO TABS: 0.5000 | ORAL_TABLET | Freq: Every day | ORAL | 1 refills | Status: DC

## 2019-05-10 NOTE — Assessment & Plan Note (Signed)
Not doing great, but starting to do better. Continue to follow with psychiatry. Call with any concerns. Continue to monitor.

## 2019-05-10 NOTE — Patient Instructions (Signed)
[facility]  Address: [address] [place], [place], [place] [address]  Phone: [phone]

## 2019-05-10 NOTE — Progress Notes (Signed)
BP 126/80   Pulse (!) 121   Temp 98.3 F (36.8 C) (Oral)   Ht 5' 4.5" (1.638 m)   Wt 211 lb (95.7 kg)   SpO2 99%   BMI 35.66 kg/m    Subjective:    Patient ID: Cheryl Flowers, female    DOB: 01/07/1981, 38 y.o.   MRN: [id number]  HPI: Cheryl Flowers is a 38 y.o. female  Chief Complaint  Patient presents with  . Anxiety    pt states she is still anxious  . Depression   ANXIETY/DEPRESSION- had a telehealth visit with her psychiatrist. Taking 90 of the cymbalta and increased her trazodone. She is noticing she's up about 2 hours later. She is feeling less depressed, but still very anxious. She is going to follow up with them in the next 2 weeks.  Duration:better Anxious mood: yes  Excessive worrying: yes Irritability: yes  Sweating: no Nausea: no Palpitations:yes Hyperventilation: no Panic attacks: no Agoraphobia: yes  Obscessions/compulsions: yes Depressed mood: yes Depression screen [facility] 2/9 05/10/2019 01/06/2019 05/12/2018 03/30/2018 03/04/2018  Decreased Interest 2 0 0 0 1  Down, Depressed, Hopeless 1 0 0 0 0  PHQ - 2 Score 3 0 0 0 1  Altered sleeping 3 - 0 0 0  Tired, decreased energy 3 3 0 1 2  Change in appetite 0 2 0 2 1  Feeling bad or failure about yourself  1 0 0 0 0  Trouble concentrating _0 Moving slowly or fidgety/restless 2 1 0 0 0  Suicidal thoughts 0 0 0 0 0  PHQ-9 Score 14 - _1 Difficult doing work/chores - - Not difficult at all Somewhat difficult Somewhat difficult   Anhedonia: no Weight changes: no Insomnia: yes hard to stay asleep  Hypersomnia: no Fatigue/loss of energy: yes Feelings of worthlessness: yes Feelings of guilt: yes Impaired concentration/indecisiveness: yes Suicidal ideations: no  Crying spells: no Recent Stressors/Life Changes: yes  Notes that she has noticed a lump in her breast for about the past 2 weeks or so. She thought that it was due to her cycle, but that's over and its still there. No pain.  No discharge. No other concerns.   Relevant past medical, surgical, family and social history reviewed and updated as indicated. Interim medical history since our last visit reviewed. Allergies and medications reviewed and updated.  Review of Systems  Constitutional: Negative.   Respiratory: Negative.   Cardiovascular: Negative.   Musculoskeletal: Negative.   Skin: Negative.   Neurological: Negative.   Psychiatric/Behavioral: Positive for agitation, dysphoric mood and sleep disturbance. Negative for behavioral problems, confusion, decreased concentration, hallucinations, self-injury and suicidal ideas. The patient is nervous/anxious. The patient is not hyperactive.     Per HPI unless specifically indicated above     Objective:    BP 126/80   Pulse (!) 121   Temp 98.3 F (36.8 C) (Oral)   Ht 5' 4.5" (1.638 m)   Wt 211 lb (95.7 kg)   SpO2 99%   BMI 35.66 kg/m   Wt Readings from Last 3 Encounters:  05/10/19 211 lb (95.7 kg)  04/05/19 205 lb (93 kg)  01/06/19 210 lb (95.3 kg)    Physical Exam Vitals signs and nursing note reviewed.  Constitutional:      General: She is not in acute distress.    Appearance: Normal appearance. She is not ill-appearing, toxic-appearing or diaphoretic.  HENT:     Head: Normocephalic and  atraumatic.     Right Ear: External ear normal.     Left Ear: External ear normal.     Nose: Nose normal.     Mouth/Throat:     Mouth: Mucous membranes are moist.     Pharynx: Oropharynx is clear.  Eyes:     General: No scleral icterus.       Right eye: No discharge.        Left eye: No discharge.     Extraocular Movements: Extraocular movements intact.     Conjunctiva/sclera: Conjunctivae normal.     Pupils: Pupils are equal, round, and reactive to light.  Neck:     Musculoskeletal: Normal range of motion and neck supple.  Cardiovascular:     Rate and Rhythm: Regular rhythm. Tachycardia present.     Pulses: Normal pulses.     Heart sounds: Normal  heart sounds. No murmur. No friction rub. No gallop.   Pulmonary:     Effort: Pulmonary effort is normal. No respiratory distress.     Breath sounds: Normal breath sounds. No stridor. No wheezing, rhonchi or rales.  Chest:     Chest wall: No tenderness.     Breasts:        Right: No swelling, bleeding, inverted nipple, nipple discharge, skin change or tenderness.        Left: No swelling, bleeding, inverted nipple, nipple discharge, skin change or tenderness.    Musculoskeletal: Normal range of motion.  Lymphadenopathy:     Upper Body:     Right upper body: No supraclavicular, axillary or pectoral adenopathy.     Left upper body: No supraclavicular, axillary or pectoral adenopathy.  Skin:    General: Skin is warm and dry.     Capillary Refill: Capillary refill takes less than 2 seconds.     Coloration: Skin is not jaundiced or pale.     Findings: No bruising, erythema, lesion or rash.  Neurological:     General: No focal deficit present.     Mental Status: She is alert and oriented to person, place, and time. Mental status is at baseline.  Psychiatric:        Mood and Affect: Mood normal.        Behavior: Behavior normal.        Thought Content: Thought content normal.        Judgment: Judgment normal.     Results for orders placed or performed in visit on 04/05/19  Microscopic Examination   BLD  Result Value Ref Range   WBC, UA 0-5 0 - 5 /hpf   RBC 11-30 (A) 0 - 2 /hpf   Epithelial Cells (non renal) 0-10 0 - 10 /hpf   Mucus, UA Present Not Estab.   Bacteria, UA Few (A) None seen/Few  Bayer DCA Hb A1c Waived  Result Value Ref Range   HB A1C (BAYER DCA - WAIVED) 5.1 <7.0 %  CBC with Differential/Platelet  Result Value Ref Range   WBC 9.1 3.4 - 10.8 x10E3/uL   RBC 4.20 3.77 - 5.28 x10E6/uL   Hemoglobin 13.0 11.1 - 15.9 g/dL   Hematocrit 39.4 34.0 - 46.6 %   MCV 94 79 - 97 fL   MCH 31.0 26.6 - 33.0 pg   MCHC 33.0 31.5 - 35.7 g/dL   RDW 12.0 11.7 - 15.4 %   Platelets  336 150 - 450 x10E3/uL   Neutrophils 70 Not Estab. %   Lymphs 24 Not Estab. %   Monocytes 4  Not Estab. %   Eos 2 Not Estab. %   Basos 0 Not Estab. %   Neutrophils Absolute 6.3 1.4 - 7.0 x10E3/uL   Lymphocytes Absolute 2.2 0.7 - 3.1 x10E3/uL   Monocytes Absolute 0.4 0.1 - 0.9 x10E3/uL   EOS (ABSOLUTE) 0.2 0.0 - 0.4 x10E3/uL   Basophils Absolute 0.0 0.0 - 0.2 x10E3/uL   Immature Granulocytes 0 Not Estab. %   Immature Grans (Abs) 0.0 0.0 - 0.1 x10E3/uL  Comprehensive metabolic panel  Result Value Ref Range   Glucose 96 65 - 99 mg/dL   BUN 5 (L) 6 - 20 mg/dL   Creatinine, Ser 0.78 0.57 - 1.00 mg/dL   GFR calc non Af Amer 97 >59 mL/min/1.73   GFR calc Af Amer 112 >59 mL/min/1.73   BUN/Creatinine Ratio 6 (L) 9 - 23   Sodium 141 134 - 144 mmol/L   Potassium 3.9 3.5 - 5.2 mmol/L   Chloride 106 96 - 106 mmol/L   CO2 19 (L) 20 - 29 mmol/L   Calcium 9.1 8.7 - 10.2 mg/dL   Total Protein 7.7 6.0 - 8.5 g/dL   Albumin 4.4 3.8 - 4.8 g/dL   Globulin, Total 3.3 1.5 - 4.5 g/dL   Albumin/Globulin Ratio 1.3 1.2 - 2.2   Bilirubin Total 0.3 0.0 - 1.2 mg/dL   Alkaline Phosphatase 78 39 - 117 IU/L   AST 17 0 - 40 IU/L   ALT 16 0 - 32 IU/L  Lipid Panel w/o Chol/HDL Ratio  Result Value Ref Range   Cholesterol, Total 177 100 - 199 mg/dL   Triglycerides 67 0 - 149 mg/dL   HDL 52 >39 mg/dL   VLDL Cholesterol Cal 13 5 - 40 mg/dL   LDL Calculated 112 (H) 0 - 99 mg/dL  Microalbumin, Urine Waived  Result Value Ref Range   Microalb, Ur Waived 30 (H) 0 - 19 mg/L   Creatinine, Urine Waived 200 10 - 300 mg/dL   Microalb/Creat Ratio <30 <30 mg/g  TSH  Result Value Ref Range   TSH 0.861 0.450 - 4.500 uIU/mL  UA/M w/rflx Culture, Routine   Specimen: Blood   BLD  Result Value Ref Range   Specific Gravity, UA 1.015 1.005 - 1.030   pH, UA 7.0 5.0 - 7.5   Color, UA Yellow Yellow   Appearance Ur Clear Clear   Leukocytes,UA Negative Negative   Protein,UA Negative Negative/Trace   Glucose, UA Negative  Negative   Ketones, UA Trace (A) Negative   RBC, UA Trace (A) Negative   Bilirubin, UA Negative Negative   Urobilinogen, Ur 1.0 0.2 - 1.0 mg/dL   Nitrite, UA Negative Negative   Microscopic Examination See below:   VITAMIN D 25 Hydroxy (Vit-D Deficiency, Fractures)  Result Value Ref Range   Vit D, 25-Hydroxy 24.3 (L) 30.0 - 100.0 ng/mL  RA Qn+CCP(IgG/A)+SjoSSA+SjoSSB  Result Value Ref Range   Rhuematoid fact SerPl-aCnc <10.0 0.0 - 13.9 IU/mL   ENA SSA (RO) Ab <0.2 0.0 - 0.9 AI   ENA SSB (LA) Ab <0.2 0.0 - 0.9 AI   Cyclic Citrullin Peptide Ab 4 0 - 19 units  Sed Rate (ESR)  Result Value Ref Range   Sed Rate 83 (H) 0 - 32 mm/hr  CK (Creatine Kinase)  Result Value Ref Range   Total CK 89 32 - 182 U/L  Antinuclear Antib (ANA)  Result Value Ref Range   Anti Nuclear Antibody (ANA) Positive (A) Negative  Lyme Ab/Western Blot Reflex  Result  Value Ref Range   Lyme IgG/IgM Ab <0.91 0.00 - 0.90 ISR   LYME DISEASE AB, QUANT, IGM <0.80 0.00 - 0.79 index  Babesia microti Antibody Panel  Result Value Ref Range   Babesia microti IgM <1:10 Neg:<1:10   Babesia microti IgG <1:10 Neg:<1:10  Rocky mtn spotted fvr ab, IgG-blood  Result Value Ref Range   RMSF IgG Equivocal Negative  B Nat Peptide  Result Value Ref Range   BNP <2.5 0.0 - 867.5 pg/mL  Ehrlichia Antibody Panel  Result Value Ref Range   E.Chaffeensis (HME) IgG Negative Neg:<1:64   E. Chaffeensis (HME) IgM Titer Negative Neg:<1:20   HGE IgG Titer Negative Neg:<1:64   HGE IgM Titer Negative Neg:<1:20  RMSF, IgG, IFA  Result Value Ref Range   RMSF, IGG, IFA 1:64 (H) Neg <1:64      Assessment & Plan:   Problem List Items Addressed This Visit      Other   Generalized anxiety disorder with panic attacks    Not doing great, but starting to do better. Continue to follow with psychiatry. Call with any concerns. Continue to monitor.       Relevant Medications   DULoxetine (CYMBALTA) 30 MG capsule   Moderate episode of  recurrent major depressive disorder ([place])    Not doing great, but starting to do better. Continue to follow with psychiatry. Call with any concerns. Continue to monitor.       Relevant Medications   DULoxetine (CYMBALTA) 30 MG capsule    Other Visit Diagnoses    Lump or mass in breast    -  Primary   Will get mammogram set. Await results.    Relevant Orders   MM Digital Diagnostic Bilat   US BREAST LTD UNI LEFT INC AXILLA   US BREAST LTD UNI RIGHT INC AXILLA   Tachycardia       Due to her anxiety most likely- will start metoprolol and cut her triamterine in 1/2. Recheck about 6 weeks. Call with any concerns.        Follow up plan: Return for Before Sept 7 for follow up ADD.

## 2019-05-13 ENCOUNTER — Encounter: Payer: Self-pay | Admitting: Family Medicine

## 2019-05-13 NOTE — Telephone Encounter (Signed)
Has a referral been sent psych?

## 2019-05-16 ENCOUNTER — Ambulatory Visit
Admission: RE | Admit: 2019-05-16 | Discharge: 2019-05-16 | Disposition: A | Discharge status: Home / Self Care | Payer: Medicaid Other | Source: Ambulatory Visit | Attending: Family Medicine | Admitting: Family Medicine

## 2019-05-16 DIAGNOSIS — N6310 Unspecified lump in the right breast, unspecified quadrant: Secondary | ICD-10-CM | POA: Insufficient documentation

## 2019-05-16 DIAGNOSIS — N6320 Unspecified lump in the left breast, unspecified quadrant: Secondary | ICD-10-CM | POA: Insufficient documentation

## 2019-05-16 DIAGNOSIS — N63 Unspecified lump in unspecified breast: Secondary | ICD-10-CM

## 2019-05-16 DIAGNOSIS — Z803 Family history of malignant neoplasm of breast: Secondary | ICD-10-CM | POA: Diagnosis not present

## 2019-05-20 ENCOUNTER — Other Ambulatory Visit: Payer: Self-pay

## 2019-05-20 DIAGNOSIS — M5481 Occipital neuralgia: Secondary | ICD-10-CM | POA: Diagnosis not present

## 2019-05-20 DIAGNOSIS — G43019 Migraine without aura, intractable, without status migrainosus: Secondary | ICD-10-CM | POA: Diagnosis not present

## 2019-05-20 DIAGNOSIS — R200 Anesthesia of skin: Secondary | ICD-10-CM | POA: Diagnosis not present

## 2019-05-20 DIAGNOSIS — R202 Paresthesia of skin: Secondary | ICD-10-CM | POA: Diagnosis not present

## 2019-05-23 ENCOUNTER — Other Ambulatory Visit: Payer: Self-pay

## 2019-05-23 ENCOUNTER — Other Ambulatory Visit
Admission: RE | Admit: 2019-05-23 | Discharge: 2019-05-23 | Disposition: A | Discharge status: Home / Self Care | Payer: Medicaid Other | Source: Ambulatory Visit | Attending: Internal Medicine | Admitting: Internal Medicine

## 2019-05-23 DIAGNOSIS — M533 Sacrococcygeal disorders, not elsewhere classified: Secondary | ICD-10-CM | POA: Insufficient documentation

## 2019-05-23 DIAGNOSIS — Z20828 Contact with and (suspected) exposure to other viral communicable diseases: Secondary | ICD-10-CM | POA: Diagnosis not present

## 2019-05-23 DIAGNOSIS — R768 Other specified abnormal immunological findings in serum: Secondary | ICD-10-CM | POA: Insufficient documentation

## 2019-05-23 DIAGNOSIS — R9389 Abnormal findings on diagnostic imaging of other specified body structures: Secondary | ICD-10-CM | POA: Diagnosis not present

## 2019-05-23 LAB — SARS CORONAVIRUS 2 (TAT 6-24 HRS): SARS Coronavirus 2: NEGATIVE

## 2019-05-27 ENCOUNTER — Ambulatory Visit: Payer: Medicaid Other | Attending: Internal Medicine

## 2019-05-27 DIAGNOSIS — G4710 Hypersomnia, unspecified: Secondary | ICD-10-CM | POA: Diagnosis not present

## 2019-05-30 ENCOUNTER — Other Ambulatory Visit: Payer: Self-pay

## 2019-06-01 DIAGNOSIS — M47816 Spondylosis without myelopathy or radiculopathy, lumbar region: Secondary | ICD-10-CM | POA: Diagnosis not present

## 2019-06-01 DIAGNOSIS — M5136 Other intervertebral disc degeneration, lumbar region: Secondary | ICD-10-CM | POA: Diagnosis not present

## 2019-06-01 DIAGNOSIS — M461 Sacroiliitis, not elsewhere classified: Secondary | ICD-10-CM | POA: Diagnosis not present

## 2019-06-07 DIAGNOSIS — M461 Sacroiliitis, not elsewhere classified: Secondary | ICD-10-CM | POA: Diagnosis not present

## 2019-06-10 DIAGNOSIS — G4733 Obstructive sleep apnea (adult) (pediatric): Secondary | ICD-10-CM | POA: Diagnosis not present

## 2019-06-20 DIAGNOSIS — R202 Paresthesia of skin: Secondary | ICD-10-CM | POA: Diagnosis not present

## 2019-06-20 DIAGNOSIS — R200 Anesthesia of skin: Secondary | ICD-10-CM | POA: Diagnosis not present

## 2019-06-28 ENCOUNTER — Encounter: Payer: Self-pay | Admitting: Family Medicine

## 2019-06-29 ENCOUNTER — Ambulatory Visit: Payer: Medicaid Other | Admitting: Family Medicine

## 2019-07-06 ENCOUNTER — Ambulatory Visit (INDEPENDENT_AMBULATORY_CARE_PROVIDER_SITE_OTHER): Payer: Medicaid Other | Admitting: Family Medicine

## 2019-07-06 ENCOUNTER — Encounter: Payer: Self-pay | Admitting: Family Medicine

## 2019-07-06 ENCOUNTER — Other Ambulatory Visit: Payer: Self-pay

## 2019-07-06 DIAGNOSIS — F988 Other specified behavioral and emotional disorders with onset usually occurring in childhood and adolescence: Secondary | ICD-10-CM | POA: Diagnosis not present

## 2019-07-06 MED ORDER — AMPHETAMINE-DEXTROAMPHET ER 15 MG PO CP24: 15.0000 mg | ORAL_CAPSULE | ORAL | 0 refills | Status: DC

## 2019-07-06 NOTE — Assessment & Plan Note (Signed)
Doing well on her adderall. Refills for 3 months given. Call with any concerns. Recheck 3 months.

## 2019-07-06 NOTE — Progress Notes (Signed)
BP 120/78   Pulse 91   Temp 98.2 F (36.8 C)    Subjective:    Patient ID: Cheryl Flowers, female    DOB: [birth year], 38 y.o.   MRN: [id number]  HPI: Cheryl Flowers is a 38 y.o. female  Chief Complaint  Patient presents with  . ADD   ADHD FOLLOW UP ADHD status: controlled Satisfied with current therapy: yes Medication compliance:  excellent compliance Controlled substance contract: yes Previous psychiatry evaluation: no Previous medications: no    Taking meds on weekends/vacations: yes Work/school performance:  good Difficulty sustaining attention/completing tasks: no Distracted by extraneous stimuli: no Does not listen when spoken to: no  Fidgets with hands or feet: no Unable to stay in seat: no Blurts out/interrupts others: no ADHD Medication Side Effects: no    Decreased appetite: no    Headache: no    Sleeping disturbance pattern: no    Irritability: no    Rebound effects (worse than baseline) off medication: no    Anxiousness: no    Dizziness: no    Tics: no  Relevant past medical, surgical, family and social history reviewed and updated as indicated. Interim medical history since our last visit reviewed. Allergies and medications reviewed and updated.  Review of Systems  Constitutional: Negative.   Respiratory: Negative.   Cardiovascular: Negative.   Neurological: Negative.   Psychiatric/Behavioral: Negative.     Per HPI unless specifically indicated above     Objective:    BP 120/78   Pulse 91   Temp 98.2 F (36.8 C)   Wt Readings from Last 3 Encounters:  05/10/19 211 lb (95.7 kg)  04/05/19 205 lb (93 kg)  01/06/19 210 lb (95.3 kg)    Physical Exam Vitals signs and nursing note reviewed.  Constitutional:      General: She is not in acute distress.    Appearance: Normal appearance. She is not ill-appearing, toxic-appearing or diaphoretic.  HENT:     Head: Normocephalic and atraumatic.     Right Ear: External ear  normal.     Left Ear: External ear normal.     Nose: Nose normal.     Mouth/Throat:     Mouth: Mucous membranes are moist.     Pharynx: Oropharynx is clear.  Eyes:     General: No scleral icterus.       Right eye: No discharge.        Left eye: No discharge.     Conjunctiva/sclera: Conjunctivae normal.     Pupils: Pupils are equal, round, and reactive to light.  Neck:     Musculoskeletal: Normal range of motion.  Pulmonary:     Effort: Pulmonary effort is normal. No respiratory distress.     Comments: Speaking in full sentences Musculoskeletal: Normal range of motion.  Skin:    Coloration: Skin is not jaundiced or pale.     Findings: No bruising, erythema, lesion or rash.  Neurological:     Mental Status: She is alert and oriented to person, place, and time. Mental status is at baseline.  Psychiatric:        Mood and Affect: Mood normal.        Behavior: Behavior normal.        Thought Content: Thought content normal.        Judgment: Judgment normal.     Results for orders placed or performed during the hospital encounter of 05/23/19  SARS Coronavirus 2 (Performed in [facility] hospital lab)  Specimen: Nasal Swab  Result Value Ref Range   SARS Coronavirus 2 NEGATIVE NEGATIVE      Assessment & Plan:   Problem List Items Addressed This Visit      Other   Attention deficit disorder (ADD) in adult    Doing well on her adderall. Refills for 3 months given. Call with any concerns. Recheck 3 months.           Follow up plan: Return in about 3 months (around 10/05/2019).    . This visit was completed via FaceTime due to the restrictions of the COVID-19 pandemic. All issues as above were discussed and addressed. Physical exam was done as above through visual confirmation on FaceTime. If it was felt that the patient should be evaluated in the office, they were directed there. The patient verbally consented to this visit. . Location of the patient: home . Location of  the provider: home . Those involved with this call:  . Provider: Park Liter, DO . CMA: Tiffany Reel, CMA . Front Desk/Registration: Don Perking  . Time spent on call: 15 minutes with patient face to face via video conference. More than 50% of this time was spent in counseling and coordination of care. 23 minutes total spent in review of patient's record and preparation of their chart.

## 2019-08-15 ENCOUNTER — Telehealth: Payer: Self-pay | Admitting: Internal Medicine

## 2019-08-15 NOTE — Telephone Encounter (Signed)
Left message for pt to scheduled phone/virtual visit with Dr. Mortimer Fries.

## 2019-08-15 NOTE — Telephone Encounter (Signed)
Called and spoke with patient and advised patient that CT has been submitted for Authorization, once approved by insurance I will contact her to schedule.  Pt voiced understanding. Rhonda J Cobb

## 2019-08-15 NOTE — Telephone Encounter (Signed)
Called and spoke to pt, who is requesting sleep study. In lab sleep study has been printed and placed in DK's folder for review.  Pt is also due for CT around 09/10/2019, and would like to schedule.   Rhonda, please advise on CT. DK, please advise on results.

## 2019-08-15 NOTE — Telephone Encounter (Signed)
She will need follow up televisit appointement to discuss results or in-person OV

## 2019-08-16 NOTE — Telephone Encounter (Signed)
CT Chest scheduled for Tues 09/13/2019 at 9:30 at [facility]. Pt to arrive at 9:15. Pt is aware of appointment date, time, location, and arrival time. Rhonda J Cobb

## 2019-08-18 NOTE — Telephone Encounter (Signed)
Left message for pt

## 2019-08-25 NOTE — Telephone Encounter (Signed)
Left message for pt

## 2019-08-29 NOTE — Telephone Encounter (Signed)
Pt has been scheduled for phone visit on 09/01/2019. Nothing further is needed.

## 2019-09-01 ENCOUNTER — Encounter: Payer: Self-pay | Admitting: Internal Medicine

## 2019-09-01 ENCOUNTER — Ambulatory Visit (INDEPENDENT_AMBULATORY_CARE_PROVIDER_SITE_OTHER): Payer: Medicaid Other | Admitting: Internal Medicine

## 2019-09-01 DIAGNOSIS — J4540 Moderate persistent asthma, uncomplicated: Secondary | ICD-10-CM | POA: Diagnosis not present

## 2019-09-01 DIAGNOSIS — F17210 Nicotine dependence, cigarettes, uncomplicated: Secondary | ICD-10-CM

## 2019-09-01 NOTE — Patient Instructions (Signed)
Please stop smoking CT chest pending

## 2019-09-01 NOTE — Progress Notes (Signed)
[place] [facility] Consultation      I connected with the patient by telephone enabled telemedicine visit and verified that I am speaking with the correct person using two identifiers.    I discussed the limitations, risks, security and privacy concerns of performing an evaluation and management service by telemedicine and the availability of in-person appointments. I also discussed with the patient that there may be a patient responsible charge related to this service. The patient expressed understanding and agreed to proceed.  PATIENT AGREES AND CONFIRMS -YES   Other persons participating in the visit and their role in the encounter: Patient, nursing  This visit type was conducted due to national recommendations for restrictions regarding the COVID-19 Pandemic (e.g. social distancing).  This format is felt to be most appropriate for this patient at this time.  All issues noted in this document were discussed and addressed.      PREVIOUS HPI She got shingles around [place] the left side of her neck. Further eval of this at ENT led to a CT of the neck which showed a lung.  She feels that breathing is ok but she has mild dyspnea. She smokes about a ppd for about 16 years. She is thinking about quitting, she has quit for 3 months in the past with the past, she thinks that she is ready to try again.  Denies weight loss but thought due to aderal. She does have a cough at night and in early AM. Denies hemoptysis.  She has no pets inside, she has never been tested for allergies, denies reflux.       Date: 09/01/2019  MRN# [id number] Cheryl Flowers [birth year]   Cheryl Flowers is a 38 y.o. old female seen in consultation for chief complaint of:    CC  Follow UP ASTHMA   HPI:   No ASTHMA exacerbation at this time No evidence of heart failure at this time No evidence or signs of infection at this time No respiratory distress No fevers, chills, nausea,  vomiting, diarrhea No evidence of lower extremity edema No evidence hemoptysis  Has been prescribed advair but has not taken the inhaler since then   Smoking Assessment and Cessation Counseling   Upon further questioning, Patient smokes 1/2 ppd  I have advised patient to quit/stop smoking as soon as possible due to high risk for multiple medical problems  Patient  is willing to quit smoking  I have advised patient that we can assist and have options of Nicotine replacement therapy. I also advised patient on behavioral therapy and can provide oral medication therapy in conjunction with the other therapies  Follow up next Office visit  for assessment of smoking cessation  Smoking cessation counseling advised for 4 minutes      CXR 02/20/18, CT chest 01/20/09>> imaging personally reviewed; mild bibasilar atelectasis. Lungs otherwise normal.  **CBC 02/26/18>> Abs eos 100.   PMHX:   Past Medical History:  Diagnosis Date  . Anxiety   . Depression   . Low back pain   . Marital conflict   . Panic disorder   . Thyromegaly    Surgical Hx:  Past Surgical History:  Procedure Laterality Date  . CESAREAN SECTION  2003 and 2010  . CHOLECYSTECTOMY  2007  . TUBAL LIGATION  2010   Family Hx:  Family History  Problem Relation Age of Onset  . Hypertension Mother   . Rheum arthritis Mother   . Diabetes Father   . Breast cancer  Maternal Aunt        early 15   Social Hx:   Social History   Tobacco Use  . Smoking status: Current Every Day Smoker    Packs/day: 0.25    Years: 19.00    Pack years: 4.75    Types: Cigarettes    Last attempt to quit: 01/09/2019    Years since quitting: 0.6  . Smokeless tobacco: Never Used  Substance Use Topics  . Alcohol use: Not Currently  . Drug use: No   Medication:    Current Outpatient Medications:  .  amphetamine-dextroamphetamine (ADDERALL XR) 15 MG 24 hr capsule, Take 1 capsule by mouth every morning., Disp: 30 capsule, Rfl: 0 .   amphetamine-dextroamphetamine (ADDERALL XR) 15 MG 24 hr capsule, Take 1 capsule by mouth every morning., Disp: 30 capsule, Rfl: 0 .  [START ON 09/04/2019] amphetamine-dextroamphetamine (ADDERALL XR) 15 MG 24 hr capsule, Take 1 capsule by mouth every morning., Disp: 30 capsule, Rfl: 0 .  cyclobenzaprine (FLEXERIL) 10 MG tablet, TAKE 1 TABLET BY MOUTH THREE TIMES A DAY AS NEEDED FOR MUSCLE SPASMS, Disp: 90 tablet, Rfl: 0 .  DULoxetine (CYMBALTA) 30 MG capsule, TAKE 3 CAPSULE BY MOUTH CAPSULES EVERY MORNING, Disp: , Rfl:  .  metoprolol succinate (TOPROL-XL) 25 MG 24 hr tablet, Take 1 tablet (25 mg total) by mouth daily., Disp: 30 tablet, Rfl: 3 .  SUMAtriptan (IMITREX) 100 MG tablet, Take by mouth., Disp: , Rfl:  .  triamterene-hydrochlorothiazide (MAXZIDE-25) 37.5-25 MG tablet, Take 0.5 tablets by mouth daily., Disp: 90 tablet, Rfl: 1   Allergies:  Morphine and related and Sulfa antibiotics   Review of Systems:  Gen:  Denies  fever, sweats, chills weight loss  HEENT: Denies blurred vision, double vision, ear pain, eye pain, hearing loss, nose bleeds, sore throat Cardiac:  No dizziness, chest pain or heaviness, chest tightness,edema, No JVD Resp:   No cough, -sputum production, -shortness of breath,-wheezing, -hemoptysis,  Gi: Denies swallowing difficulty, stomach pain, nausea or vomiting, diarrhea, constipation, bowel incontinence Gu:  Denies bladder incontinence, burning urine Ext:   Denies Joint pain, stiffness or swelling Skin: Denies  skin rash, easy bruising or bleeding or hives Endoc:  Denies polyuria, polydipsia , polyphagia or weight change Psych:   Denies depression, insomnia or hallucinations  Other:  All other systems negative      Assessment and Plan:  REACTIVE AIRWAYS  No issues at this time No inhaler therapy at this time  Smoking Cessation strongly advised  Lung nodule. CT chest pending  COVID-19 EDUCATION: The signs and symptoms of COVID-19 were discussed with  the patient and how to seek care for testing.  The importance of social distancing was discussed today. Hand Washing Techniques and avoid touching face was advised.     MEDICATION ADJUSTMENTS/LABS AND TESTS ORDERED:  Recommend smoking cessation CT chest pending  CURRENT MEDICATIONS REVIEWED AT LENGTH WITH PATIENT TODAY   Patient satisfied with Plan of action and management. All questions answered  Follow up in 6 months  Total time spent 21 minutes  Corrin Parker, M.D.  [facility]  Medical Director [place] Director [facility] Cardio-Pulmonary Department

## 2019-09-13 ENCOUNTER — Other Ambulatory Visit: Payer: Self-pay

## 2019-09-13 ENCOUNTER — Ambulatory Visit
Admission: RE | Admit: 2019-09-13 | Discharge: 2019-09-13 | Disposition: A | Discharge status: Home / Self Care | Payer: Medicaid Other | Source: Ambulatory Visit | Attending: Internal Medicine | Admitting: Internal Medicine

## 2019-09-13 DIAGNOSIS — R918 Other nonspecific abnormal finding of lung field: Secondary | ICD-10-CM | POA: Diagnosis not present

## 2019-09-13 DIAGNOSIS — R911 Solitary pulmonary nodule: Secondary | ICD-10-CM | POA: Diagnosis not present

## 2019-09-16 NOTE — Telephone Encounter (Signed)
DK please advise on CT results from 09/13/2019.

## 2019-09-21 ENCOUNTER — Other Ambulatory Visit: Payer: Self-pay | Admitting: Family Medicine

## 2019-09-21 MED ORDER — CYCLOBENZAPRINE HCL 10 MG PO TABS: 10.0000 mg | ORAL_TABLET | Freq: Every day | ORAL | 0 refills | Status: DC

## 2019-09-25 ENCOUNTER — Other Ambulatory Visit: Payer: Self-pay | Admitting: Family Medicine

## 2019-09-26 ENCOUNTER — Encounter: Payer: Self-pay | Admitting: Family Medicine

## 2019-10-07 ENCOUNTER — Encounter: Payer: Self-pay | Admitting: Family Medicine

## 2019-10-07 ENCOUNTER — Ambulatory Visit (INDEPENDENT_AMBULATORY_CARE_PROVIDER_SITE_OTHER): Payer: Medicaid Other | Admitting: Family Medicine

## 2019-10-07 ENCOUNTER — Other Ambulatory Visit: Payer: Self-pay

## 2019-10-07 VITALS — HR 100 | Temp 98.9°F

## 2019-10-07 DIAGNOSIS — F410 Panic disorder [episodic paroxysmal anxiety] without agoraphobia: Secondary | ICD-10-CM

## 2019-10-07 DIAGNOSIS — F411 Generalized anxiety disorder: Secondary | ICD-10-CM

## 2019-10-07 DIAGNOSIS — I10 Essential (primary) hypertension: Secondary | ICD-10-CM | POA: Diagnosis not present

## 2019-10-07 DIAGNOSIS — F331 Major depressive disorder, recurrent, moderate: Secondary | ICD-10-CM

## 2019-10-07 DIAGNOSIS — F988 Other specified behavioral and emotional disorders with onset usually occurring in childhood and adolescence: Secondary | ICD-10-CM

## 2019-10-07 MED ORDER — METOPROLOL SUCCINATE ER 25 MG PO TB24: 25.0000 mg | ORAL_TABLET | Freq: Every day | ORAL | 0 refills | Status: DC

## 2019-10-07 MED ORDER — DULOXETINE HCL 60 MG PO CPEP: 60.0000 mg | ORAL_CAPSULE | Freq: Every day | ORAL | 1 refills | Status: DC

## 2019-10-07 MED ORDER — AMPHETAMINE-DEXTROAMPHET ER 15 MG PO CP24: 15.0000 mg | ORAL_CAPSULE | ORAL | 0 refills | Status: DC

## 2019-10-07 MED ORDER — TRIAMTERENE-HCTZ 37.5-25 MG PO TABS: 0.5000 | ORAL_TABLET | Freq: Every day | ORAL | 0 refills | Status: DC

## 2019-10-07 NOTE — Progress Notes (Signed)
Lvm for pt to make 3 month f/u appointment Sent letter.

## 2019-10-07 NOTE — Progress Notes (Signed)
Pulse 100   Temp 98.9 F (37.2 C)   SpO2 98%    Subjective:    Patient ID: Cheryl Flowers, female    DOB: 1981/05/07, 38 y.o.   MRN: [id number]  HPI: Cheryl Flowers is a 38 y.o. female  Chief Complaint  Patient presents with  . ADD  . Hypertension  . Mood   HYPERTENSION Hypertension status: controlled  Satisfied with current treatment? yes Duration of hypertension: chronic BP monitoring frequency:  not checking BP range:  BP medication side effects:  no Medication compliance: excellent compliance Previous BP meds: Triamterene- HCTZ and metoprolol Aspirin: no Recurrent headaches: yes Visual changes: no Palpitations: no Dyspnea: no Chest pain: no Lower extremity edema: no Dizzy/lightheaded: no  ANXIETY/DEPRESSION Duration:stable Anxious mood: yes  Excessive worrying: yes Irritability: yes  Sweating: no Nausea: no Palpitations:no Hyperventilation: no Panic attacks: no Agoraphobia: no  Obscessions/compulsions: no Depressed mood: no Depression screen [facility] 2/9 10/07/2019 05/10/2019 01/06/2019 05/12/2018 03/30/2018  Decreased Interest 1 2 0 0 0  Down, Depressed, Hopeless 0 1 0 0 0  PHQ - 2 Score 1 3 0 0 0  Altered sleeping 3 3 - 0 0  Tired, decreased energy _0 0 1  Change in appetite 0 0 2 0 2  Feeling bad or failure about yourself  0 1 0 0 0  Trouble concentrating _1 Moving slowly or fidgety/restless 0 2 1 0 0  Suicidal thoughts 0 0 0 0 0  PHQ-9 Score 7 14 - 1 5  Difficult doing work/chores Somewhat difficult - - Not difficult at all Somewhat difficult   GAD 7 : Generalized Anxiety Score 10/07/2019 05/10/2019 01/06/2019 05/12/2018  Nervous, Anxious, on Edge _2 0  Control/stop worrying 0 0 0 0  Worry too much - different things 0 1 1 0  Trouble relaxing _3 0  Restless 0 1 0 0  Easily annoyed or irritable _4 Afraid - awful might happen _5 0  Total GAD 7 Score _6 Anxiety Difficulty Somewhat difficult Very  difficult - Not difficult at all   Anhedonia: no Weight changes: no Insomnia: yes hard to stay asleep  Hypersomnia: no Fatigue/loss of energy: yes Feelings of worthlessness: no Feelings of guilt: no Impaired concentration/indecisiveness: no Suicidal ideations: no  Crying spells: no Recent Stressors/Life Changes: yes   Relationship problems: yes   Family stress: yes     Financial stress: no    Job stress: no    Recent death/loss: no  ADHD FOLLOW UP ADHD status: stable Satisfied with current therapy: yes Medication compliance:  excellent compliance Controlled substance contract: yes Previous psychiatry evaluation: no Taking meds on weekends/vacations: yes Work/school performance:  good Difficulty sustaining attention/completing tasks: no Distracted by extraneous stimuli: no Does not listen when spoken to: no  Fidgets with hands or feet: no Unable to stay in seat: no Blurts out/interrupts others: no ADHD Medication Side Effects: no    Decreased appetite: no    Headache: no    Sleeping disturbance pattern: yes    Irritability: yes    Rebound effects (worse than baseline) off medication: yes    Anxiousness: no    Dizziness: no    Tics: no   Relevant past medical, surgical, family and social history reviewed and updated as indicated. Interim medical history since our last visit reviewed. Allergies and medications reviewed and updated.  Review of Systems  Constitutional: Negative.   Respiratory: Negative.   Cardiovascular: Negative.   Musculoskeletal: Negative.   Skin: Negative.   Neurological: Positive for headaches. Negative for dizziness, tremors, seizures, syncope, facial asymmetry, speech difficulty, weakness, light-headedness and numbness.  Hematological: Negative.   Psychiatric/Behavioral: Positive for decreased concentration and sleep disturbance. Negative for agitation, behavioral problems, confusion, dysphoric mood, hallucinations, self-injury and suicidal  ideas. The patient is nervous/anxious. The patient is not hyperactive.     Per HPI unless specifically indicated above     Objective:    Pulse 100   Temp 98.9 F (37.2 C)   SpO2 98%   Wt Readings from Last 3 Encounters:  05/10/19 211 lb (95.7 kg)  04/05/19 205 lb (93 kg)  01/06/19 210 lb (95.3 kg)    Physical Exam Vitals and nursing note reviewed.  Constitutional:      General: She is not in acute distress.    Appearance: Normal appearance. She is not ill-appearing, toxic-appearing or diaphoretic.  HENT:     Head: Normocephalic and atraumatic.     Right Ear: External ear normal.     Left Ear: External ear normal.     Nose: Nose normal.     Mouth/Throat:     Mouth: Mucous membranes are moist.     Pharynx: Oropharynx is clear.  Eyes:     General: No scleral icterus.       Right eye: No discharge.        Left eye: No discharge.     Conjunctiva/sclera: Conjunctivae normal.     Pupils: Pupils are equal, round, and reactive to light.  Pulmonary:     Effort: Pulmonary effort is normal. No respiratory distress.     Comments: Speaking in full sentences Musculoskeletal:        General: Normal range of motion.     Cervical back: Normal range of motion.  Skin:    Coloration: Skin is not jaundiced or pale.     Findings: No bruising, erythema, lesion or rash.  Neurological:     Mental Status: She is alert and oriented to person, place, and time. Mental status is at baseline.  Psychiatric:        Mood and Affect: Mood normal.        Behavior: Behavior normal.        Thought Content: Thought content normal.        Judgment: Judgment normal.     Results for orders placed or performed during the hospital encounter of 05/23/19  SARS Coronavirus 2 (Performed in [place] hospital lab)   Specimen: Nasal Swab  Result Value Ref Range   SARS Coronavirus 2 NEGATIVE NEGATIVE      Assessment & Plan:   Problem List Items Addressed This Visit      Cardiovascular and Mediastinum    Essential hypertension - Primary    Unknown BP today- tolerating medicine well. Just tested for COVID yesterday- will get her in for BP check and labs in 2 weeks. Refills given. Await results.       Relevant Medications   triamterene-hydrochlorothiazide (MAXZIDE-25) 37.5-25 MG tablet   metoprolol succinate (TOPROL-XL) 25 MG 24 hr tablet   Other Relevant Orders   Comp Met (CMET)     Other   Generalized anxiety disorder with panic attacks    A bit worse because she decreased her cymbalta to 30mg from 90mg- will go back up to 60mg and recheck 3 months. Call with any concerns.         Relevant Medications   DULoxetine (CYMBALTA) 60 MG capsule   Attention deficit disorder (ADD) in adult    Under good control on current regimen. Continue current regimen. Continue to monitor. Call with any concerns. Refills given for 3 months. Follow up 3 months.        Moderate episode of recurrent major depressive disorder (HCC)    Depression good, but her anxiety is a bit worse because she decreased her cymbalta to 51m from 934m will go back up to 6049mnd recheck 3 months. Call with any concerns.       Relevant Medications   DULoxetine (CYMBALTA) 60 MG capsule       Follow up plan: Return in about 3 months (around 01/05/2020).   . This visit was completed via FaceTime due to the restrictions of the COVID-19 pandemic. All issues as above were discussed and addressed. Physical exam was done as above through visual confirmation on Facetime. If it was felt that the patient should be evaluated in the office, they were directed there. The patient verbally consented to this visit. . Location of the patient: home . Location of the provider: work . Those involved with this call:  . Provider: [employer] LiterO . CMA: Tiffany Reel, CMA . Front Desk/Registration: ChrDon Perking Time spent on call: 25 minutes with patient face to face via video conference. More than 50% of this time was spent in  counseling and coordination of care. 40 minutes total spent in review of patient's record and preparation of their chart.

## 2019-10-07 NOTE — Assessment & Plan Note (Signed)
Under good control on current regimen. Continue current regimen. Continue to monitor. Call with any concerns. Refills given for 3 months. Follow up 3 months.

## 2019-10-07 NOTE — Assessment & Plan Note (Signed)
Depression good, but her anxiety is a bit worse because she decreased her cymbalta to 29m from 952m will go back up to 6040mnd recheck 3 months. Call with any concerns.

## 2019-10-07 NOTE — Assessment & Plan Note (Signed)
A bit worse because she decreased her cymbalta to 69m from 953m will go back up to 6031mnd recheck 3 months. Call with any concerns.

## 2019-10-07 NOTE — Assessment & Plan Note (Signed)
Unknown BP today- tolerating medicine well. Just tested for COVID yesterday- will get her in for BP check and labs in 2 weeks. Refills given. Await results.

## 2019-10-25 ENCOUNTER — Other Ambulatory Visit: Payer: Medicaid Other

## 2019-10-25 ENCOUNTER — Other Ambulatory Visit: Payer: Self-pay | Admitting: Physical Medicine and Rehabilitation

## 2019-10-25 ENCOUNTER — Ambulatory Visit: Payer: Medicaid Other

## 2019-10-25 ENCOUNTER — Other Ambulatory Visit (HOSPITAL_COMMUNITY): Payer: Self-pay | Admitting: Physical Medicine and Rehabilitation

## 2019-10-25 DIAGNOSIS — M47816 Spondylosis without myelopathy or radiculopathy, lumbar region: Secondary | ICD-10-CM | POA: Diagnosis not present

## 2019-10-25 DIAGNOSIS — M461 Sacroiliitis, not elsewhere classified: Secondary | ICD-10-CM | POA: Diagnosis not present

## 2019-10-25 DIAGNOSIS — M5416 Radiculopathy, lumbar region: Secondary | ICD-10-CM

## 2019-10-25 DIAGNOSIS — M5441 Lumbago with sciatica, right side: Secondary | ICD-10-CM | POA: Diagnosis not present

## 2019-10-25 DIAGNOSIS — M5136 Other intervertebral disc degeneration, lumbar region: Secondary | ICD-10-CM | POA: Diagnosis not present

## 2019-11-02 ENCOUNTER — Ambulatory Visit
Admission: RE | Admit: 2019-11-02 | Discharge: 2019-11-02 | Disposition: A | Discharge status: Home / Self Care | Payer: Medicaid Other | Source: Ambulatory Visit | Attending: Physical Medicine and Rehabilitation | Admitting: Physical Medicine and Rehabilitation

## 2019-11-02 ENCOUNTER — Other Ambulatory Visit: Payer: Self-pay

## 2019-11-02 DIAGNOSIS — M5416 Radiculopathy, lumbar region: Secondary | ICD-10-CM | POA: Insufficient documentation

## 2019-11-02 DIAGNOSIS — M545 Low back pain: Secondary | ICD-10-CM | POA: Diagnosis not present

## 2019-11-08 DIAGNOSIS — M47816 Spondylosis without myelopathy or radiculopathy, lumbar region: Secondary | ICD-10-CM | POA: Diagnosis not present

## 2019-11-08 DIAGNOSIS — M4726 Other spondylosis with radiculopathy, lumbar region: Secondary | ICD-10-CM | POA: Diagnosis not present

## 2019-11-15 DIAGNOSIS — M5481 Occipital neuralgia: Secondary | ICD-10-CM | POA: Diagnosis not present

## 2019-11-15 DIAGNOSIS — G43019 Migraine without aura, intractable, without status migrainosus: Secondary | ICD-10-CM | POA: Diagnosis not present

## 2019-11-15 DIAGNOSIS — G932 Benign intracranial hypertension: Secondary | ICD-10-CM | POA: Diagnosis not present

## 2019-11-15 DIAGNOSIS — G5602 Carpal tunnel syndrome, left upper limb: Secondary | ICD-10-CM | POA: Diagnosis not present

## 2019-12-02 DIAGNOSIS — M47816 Spondylosis without myelopathy or radiculopathy, lumbar region: Secondary | ICD-10-CM | POA: Diagnosis not present

## 2019-12-15 ENCOUNTER — Ambulatory Visit: Admission: RE | Admit: 2019-12-15 | Payer: Medicaid Other | Source: Ambulatory Visit

## 2019-12-19 ENCOUNTER — Encounter: Payer: Self-pay | Admitting: Family Medicine

## 2019-12-20 ENCOUNTER — Other Ambulatory Visit: Payer: Self-pay

## 2019-12-20 ENCOUNTER — Encounter: Payer: Self-pay | Admitting: Nurse Practitioner

## 2019-12-20 ENCOUNTER — Ambulatory Visit (INDEPENDENT_AMBULATORY_CARE_PROVIDER_SITE_OTHER): Payer: Medicaid Other | Admitting: Nurse Practitioner

## 2019-12-20 ENCOUNTER — Ambulatory Visit: Payer: Self-pay | Admitting: Nurse Practitioner

## 2019-12-20 VITALS — BP 118/76 | HR 99 | Temp 98.5°F | Ht 64.5 in | Wt 214.0 lb

## 2019-12-20 DIAGNOSIS — K625 Hemorrhage of anus and rectum: Secondary | ICD-10-CM | POA: Diagnosis not present

## 2019-12-20 DIAGNOSIS — I10 Essential (primary) hypertension: Secondary | ICD-10-CM | POA: Diagnosis not present

## 2019-12-20 DIAGNOSIS — K921 Melena: Secondary | ICD-10-CM | POA: Diagnosis not present

## 2019-12-20 LAB — CBC WITH DIFFERENTIAL/PLATELET
Hematocrit: 36.2 % (ref 34.0–46.6)
Hemoglobin: 12.2 g/dL (ref 11.1–15.9)
Lymphocytes Absolute: 2.9 x10E3/uL (ref 0.7–3.1)
Lymphs: 34 %
MCH: 31.4 pg (ref 26.6–33.0)
MCHC: 33.7 g/dL (ref 31.5–35.7)
MCV: 93 fL (ref 79–97)
MID (Absolute): 0.4 X10E3/uL (ref 0.1–1.6)
MID: 5 %
Neutrophils Absolute: 5.4 x10E3/uL (ref 1.4–7.0)
Neutrophils: 61 %
Platelets: 343 x10E3/uL (ref 150–450)
RBC: 3.89 x10E6/uL (ref 3.77–5.28)
RDW: 14.2 % (ref 11.7–15.4)
WBC: 8.7 x10E3/uL (ref 3.4–10.8)

## 2019-12-20 NOTE — Progress Notes (Signed)
BP 118/76 (BP Location: Left Arm, Patient Position: Sitting, Cuff Size: Normal)   Pulse 99   Temp 98.5 F (36.9 C) (Oral)   Ht 5' 4.5" (1.638 m)   Wt 214 lb (97.1 kg)   SpO2 100%   BMI 36.17 kg/m    Subjective:    Patient ID: [name], female    DOB: 1981-06-15, 39 y.o.   MRN: [id number]  HPI: MOE BRIER is a 39 y.o. female  Chief Complaint  Patient presents with  . Rectal Bleeding  . Nausea  . Diarrhea   RECTAL BLEEDING Duration: about 6 -12 months Bright red rectal bleeding: yes  Amount of blood: moderate; some clots noticed in toilet today Frequency: on and off; bleeding at least weekly; ~ 4 days per week. Melena: no  Spotting on toilet tissue: yes  Blood in toilet: yes Anal fullness: yes  Perianal pain: no  Pain with defecation: no Hemorrhoids: yes; history of, used cream and got better Perianal irritation/itching: no  Diarrhea: yes Nausea: yes Vomiting: no Weight loss: no Constipation: no , BM at least daily lately Chronic straining/valsava:  yes  Anal trauma/intercourse: no  Previous colonoscopy: no  Fever: no Fatigue: yes Abdominal pain: yes; described as cramping in lower abdomen, L side > R Mucus in stool: yes, noticed on TP after BM  Of note, the patient reports her aunt had Crohn's disease and breast cancer.   Allergies  Allergen Reactions  . Morphine And Related Itching  . Sulfa Antibiotics Hives   Outpatient Encounter Medications as of 12/20/2019  Medication Sig  . amphetamine-dextroamphetamine (ADDERALL XR) 15 MG 24 hr capsule Take 1 capsule by mouth every morning.  . cyclobenzaprine (FLEXERIL) 10 MG tablet Take 1 tablet (10 mg total) by mouth at bedtime.  . DULoxetine (CYMBALTA) 60 MG capsule Take 1 capsule (60 mg total) by mouth daily.  . metoprolol succinate (TOPROL-XL) 25 MG 24 hr tablet Take 1 tablet (25 mg total) by mouth daily.  . metoprolol tartrate (LOPRESSOR) 25 MG tablet Take by mouth.  . SUMAtriptan  (IMITREX) 100 MG tablet Take by mouth.  . triamterene-hydrochlorothiazide (MAXZIDE-25) 37.5-25 MG tablet Take 0.5 tablets by mouth daily.  . [DISCONTINUED] amphetamine-dextroamphetamine (ADDERALL XR) 15 MG 24 hr capsule Take 1 capsule by mouth every morning.  . [DISCONTINUED] amphetamine-dextroamphetamine (ADDERALL XR) 15 MG 24 hr capsule Take 1 capsule by mouth every morning.   No facility-administered encounter medications on file as of 12/20/2019.   Patient Active Problem List   Diagnosis Date Noted  . Hematochezia 12/21/2019  . Moderate episode of recurrent major depressive disorder ([place]) 04/06/2019  . Tobacco abuse 05/12/2018  . Essential hypertension 03/01/2018  . Attention deficit disorder (ADD) in adult 03/01/2018  . Controlled substance agreement signed 03/01/2018  . Syncope 02/20/2018  . Morbid obesity ([place]) 02/01/2018  . Generalized anxiety disorder with panic attacks 11/02/2017  . Thyromegaly 11/02/2017  . Low back pain with right-sided sciatica 06/06/2017  . Marital conflict 97/98/9211  . Back pain due to injury 03/29/2011   Past Medical History:  Diagnosis Date  . Anxiety   . Depression   . Low back pain   . Marital conflict   . Panic disorder   . Thyromegaly    Review of Systems  Constitutional: Positive for appetite change and fatigue. Negative for activity change, chills, fever and unexpected weight change.  Respiratory: Negative.   Cardiovascular: Negative.   Gastrointestinal: Positive for abdominal pain, anal bleeding, blood in stool, diarrhea  and nausea. Negative for abdominal distention, constipation, rectal pain and vomiting.  Genitourinary: Negative.   Musculoskeletal: Negative.   Skin: Negative.   Neurological: Negative.   Psychiatric/Behavioral: Negative.     Per HPI unless specifically indicated above     Objective:    BP 118/76 (BP Location: Left Arm, Patient Position: Sitting, Cuff Size: Normal)   Pulse 99   Temp 98.5 F (36.9 C) (Oral)    Ht 5' 4.5" (1.638 m)   Wt 214 lb (97.1 kg)   SpO2 100%   BMI 36.17 kg/m   Wt Readings from Last 3 Encounters:  12/20/19 214 lb (97.1 kg)  05/10/19 211 lb (95.7 kg)  04/05/19 205 lb (93 kg)    Physical Exam Vitals and nursing note reviewed. Exam conducted with a chaperone present.  Constitutional:      General: She is not in acute distress.    Appearance: Normal appearance. She is not toxic-appearing.  Cardiovascular:     Rate and Rhythm: Normal rate and regular rhythm.  Pulmonary:     Effort: Pulmonary effort is normal. No respiratory distress.     Breath sounds: Normal breath sounds. No wheezing or rhonchi.  Abdominal:     General: Bowel sounds are normal. There is no distension.     Palpations: Abdomen is soft. There is no mass.     Tenderness: There is abdominal tenderness. There is no right CVA tenderness or left CVA tenderness.     Hernia: No hernia is present.  Genitourinary:    Rectum: Normal. No mass, tenderness, external hemorrhoid or internal hemorrhoid. Normal anal tone.  Musculoskeletal:        General: No swelling. Normal range of motion.     Cervical back: Normal range of motion. No rigidity.  Skin:    General: Skin is warm and dry.     Coloration: Skin is not jaundiced or pale.     Findings: No bruising.  Neurological:     General: No focal deficit present.     Mental Status: She is alert and oriented to person, place, and time. Mental status is at baseline.     Motor: No weakness.     Gait: Gait normal.  Psychiatric:        Mood and Affect: Mood normal.        Behavior: Behavior normal.        Thought Content: Thought content normal.        Judgment: Judgment normal.       Assessment & Plan:   Problem List Items Addressed This Visit      Cardiovascular and Mediastinum   Essential hypertension   Relevant Medications   metoprolol tartrate (LOPRESSOR) 25 MG tablet     Other   Hematochezia - Primary    Physical exam in office today unrevealing  for hemorrhoids as potential cause of rectal bleeding.  Other differentials include but not limited to inflammatory disorder of bowel, food intolerance, malignancy.  Will place urgent referral to GI given length of symptoms and family history of Crohn's Disease.  CBC, anemia panel, and Sed rate ordered.       Relevant Orders   Sed Rate (ESR)   Anemia panel   CBC With Differential/Platelet (Completed)   Ambulatory referral to Gastroenterology       Follow up plan: Return if symptoms worsen or fail to improve.

## 2019-12-21 ENCOUNTER — Encounter: Payer: Self-pay | Admitting: Nurse Practitioner

## 2019-12-21 DIAGNOSIS — K921 Melena: Secondary | ICD-10-CM | POA: Insufficient documentation

## 2019-12-21 NOTE — Patient Instructions (Addendum)
Nice to see you again!  You should hear from our referral team in the next couple of days to make an appointment to see the Gastroenterologist.  Please let us know if you need anything or have questions in the meantime.  Rectal Bleeding  Rectal bleeding is when blood passes out of the anus. People with rectal bleeding may notice bright red blood in their underwear or in the toilet after having a bowel movement. They may also have dark red or black stools. Rectal bleeding is usually a sign that something is wrong. Many things can cause rectal bleeding, including:  Hemorrhoids. These are blood vessels in the anus or rectum that are larger than normal.  Fistulas. These are abnormal passages in the rectum and anus.  Anal fissures. This is a tear in the anus.  Diverticulosis. This is a condition in which pockets or sacs project from the bowel.  Proctitis and colitis. These are conditions in which the rectum, colon, or anus become inflamed.  Polyps. These are growths that can be cancerous (malignant) or non-cancerous (benign).  Part of the rectum sticking out from the anus (rectal prolapse).  Certain medicines.  Intestinal infections. Follow these instructions at home: Pay attention to any changes in your symptoms. Take these actions to help lessen bleeding and discomfort:  Eat a diet that is high in fiber. This will keep your stool soft, making it easier to pass stools without straining. Ask your health care provider what foods and drinks are high in fiber.  Drink enough fluid to keep your urine clear or pale yellow. This also helps to keep your stool soft.  Try taking a warm bath. This may help soothe any pain in your rectum.  Keep all follow-up visits as told by your health care provider. This is important. Get help right away if:  You have new or increased rectal bleeding.  You have black or dark red stools.  You vomit blood or something that looks like coffee grounds.  You  have pain or tenderness in your abdomen.  You have a fever.  You feel weak.  You feel nauseous.  You faint.  You have severe pain in your rectum.  You cannot have a bowel movement. This information is not intended to replace advice given to you by your health care provider. Make sure you discuss any questions you have with your health care provider. Document Revised: 06/05/2016 Document Reviewed: 12/09/2015 Elsevier Patient Education  2020 [employer].

## 2019-12-21 NOTE — Assessment & Plan Note (Signed)
Physical exam in office today unrevealing for hemorrhoids as potential cause of rectal bleeding.  Other differentials include but not limited to inflammatory disorder of bowel, food intolerance, malignancy.  Will place urgent referral to GI given length of symptoms and family history of Crohn's Disease.  CBC, anemia panel, and Sed rate ordered.

## 2019-12-23 DIAGNOSIS — G5602 Carpal tunnel syndrome, left upper limb: Secondary | ICD-10-CM | POA: Diagnosis not present

## 2019-12-23 DIAGNOSIS — M5136 Other intervertebral disc degeneration, lumbar region: Secondary | ICD-10-CM | POA: Diagnosis not present

## 2019-12-23 DIAGNOSIS — M47816 Spondylosis without myelopathy or radiculopathy, lumbar region: Secondary | ICD-10-CM | POA: Diagnosis not present

## 2019-12-23 LAB — COMPREHENSIVE METABOLIC PANEL
ALT: 14 IU/L (ref 0–32)
AST: 14 IU/L (ref 0–40)
Albumin/Globulin Ratio: 1.6 (ref 1.2–2.2)
Albumin: 4.2 g/dL (ref 3.8–4.8)
Alkaline Phosphatase: 86 IU/L (ref 39–117)
BUN/Creatinine Ratio: 8 — ABNORMAL LOW (ref 9–23)
BUN: 6 mg/dL (ref 6–20)
Bilirubin Total: <0.2 mg/dL (ref 0.0–1.2)
CO2: 23 mmol/L (ref 20–29)
Calcium: 9.1 mg/dL (ref 8.7–10.2)
Chloride: 107 mmol/L — ABNORMAL HIGH (ref 96–106)
Creatinine, Ser: 0.78 mg/dL (ref 0.57–1.00)
GFR calc Af Amer: 112 mL/min/{1.73_m2} (ref >59)
GFR calc non Af Amer: 97 mL/min/{1.73_m2} (ref >59)
Globulin, Total: 2.7 g/dL (ref 1.5–4.5)
Glucose: 77 mg/dL (ref 65–99)
Potassium: 4.2 mmol/L (ref 3.5–5.2)
Sodium: 141 mmol/L (ref 134–144)
Total Protein: 6.9 g/dL (ref 6.0–8.5)

## 2019-12-23 LAB — ANEMIA PANEL
Ferritin: 28 ng/mL (ref 15–150)
Folate, Hemolysate: 315 ng/mL
Iron Saturation: 17 % (ref 15–55)
Iron: 49 ug/dL (ref 27–159)
Total Iron Binding Capacity: 291 ug/dL (ref 250–450)
UIBC: 242 ug/dL (ref 131–425)
Vitamin B-12: 1098 pg/mL (ref 232–1245)

## 2019-12-23 LAB — SEDIMENTATION RATE

## 2019-12-23 NOTE — Telephone Encounter (Signed)
DK please see mychart message. It appears from your last visit she is not on any inhalers and she is requesting a refill. Please advise.

## 2019-12-27 MED ORDER — ALBUTEROL SULFATE HFA 108 (90 BASE) MCG/ACT IN AERS: 1.0000 | INHALATION_SPRAY | Freq: Four times a day (QID) | RESPIRATORY_TRACT | 3 refills | Status: DC | PRN

## 2019-12-27 MED ORDER — FLUTICASONE-SALMETEROL 250-50 MCG/DOSE IN AEPB: 1.0000 | INHALATION_SPRAY | Freq: Two times a day (BID) | RESPIRATORY_TRACT | 3 refills | Status: DC

## 2019-12-28 ENCOUNTER — Telehealth: Payer: Self-pay | Admitting: Gastroenterology

## 2019-12-28 ENCOUNTER — Ambulatory Visit (INDEPENDENT_AMBULATORY_CARE_PROVIDER_SITE_OTHER): Payer: Medicaid Other | Admitting: Gastroenterology

## 2019-12-28 ENCOUNTER — Ambulatory Visit
Admission: RE | Admit: 2019-12-28 | Discharge: 2019-12-28 | Disposition: A | Discharge status: Home / Self Care | Payer: Medicaid Other | Source: Ambulatory Visit | Attending: Physician Assistant | Admitting: Physician Assistant

## 2019-12-28 ENCOUNTER — Encounter: Payer: Self-pay | Admitting: Gastroenterology

## 2019-12-28 ENCOUNTER — Other Ambulatory Visit: Payer: Self-pay

## 2019-12-28 DIAGNOSIS — E079 Disorder of thyroid, unspecified: Secondary | ICD-10-CM

## 2019-12-28 DIAGNOSIS — R197 Diarrhea, unspecified: Secondary | ICD-10-CM | POA: Diagnosis not present

## 2019-12-28 DIAGNOSIS — E041 Nontoxic single thyroid nodule: Secondary | ICD-10-CM | POA: Diagnosis not present

## 2019-12-28 MED ORDER — HYDROCORTISONE ACETATE 25 MG RE SUPP: 25.0000 mg | Freq: Every day | RECTAL | 1 refills | Status: AC

## 2019-12-28 NOTE — Telephone Encounter (Signed)
Left vm to schedule 4 week f/u apt with Dr. Bonna Gains

## 2019-12-28 NOTE — Patient Instructions (Signed)
High-Fiber Diet Fiber, also called dietary fiber, is a type of carbohydrate that is found in fruits, vegetables, whole grains, and beans. A high-fiber diet can have many health benefits. Your health care provider may recommend a high-fiber diet to help:  Prevent constipation. Fiber can make your bowel movements more regular.  Lower your cholesterol.  Relieve the following conditions: ? Swelling of veins in the anus (hemorrhoids). ? Swelling and irritation (inflammation) of specific areas of the digestive tract (uncomplicated diverticulosis). ? A problem of the large intestine (colon) that sometimes causes pain and diarrhea (irritable bowel syndrome, IBS).  Prevent overeating as part of a weight-loss plan.  Prevent heart disease, type 2 diabetes, and certain cancers. What is my plan? The recommended daily fiber intake in grams (g) includes:  38 g for men age 74 or younger.  30 g for men over age 70.  38 g for women age 37 or younger. or younger.  21 g for women over age 64. You can get the recommended daily intake of dietary fiber by:  Eating a variety of fruits, vegetables, grains, and beans.  Taking a fiber supplement, if it is not possible to get enough fiber through your diet. What do I need to know about a high-fiber diet?  It is better to get fiber through food sources rather than from fiber supplements. There is not a lot of research about how effective supplements are.  Always check the fiber content on the nutrition facts label of any prepackaged food. Look for foods that contain 5 g of fiber or more per serving.  Talk with a diet and nutrition specialist (dietitian) if you have questions about specific foods that are recommended or not recommended for your medical condition, especially if those foods are not listed below.  Gradually increase how much fiber you consume. If you increase your intake of dietary fiber too quickly, you may have bloating, cramping, or gas.  Drink plenty  of water. Water helps you to digest fiber. What are tips for following this plan?  Eat a wide variety of high-fiber foods.  Make sure that half of the grains that you eat each day are whole grains.  Eat breads and cereals that are made with whole-grain flour instead of refined flour or white flour.  Eat brown rice, bulgur wheat, or millet instead of white rice.  Start the day with a breakfast that is high in fiber, such as a cereal that contains 5 g of fiber or more per serving.  Use beans in place of meat in soups, salads, and pasta dishes.  Eat high-fiber snacks, such as berries, raw vegetables, nuts, and popcorn.  Choose whole fruits and vegetables instead of processed forms like juice or sauce. What foods can I eat?  Fruits Berries. Pears. Apples. Oranges. Avocado. Prunes and raisins. Dried figs. Vegetables Sweet potatoes. Spinach. Kale. Artichokes. Cabbage. Broccoli. Cauliflower. Green peas. Carrots. Squash. Grains Whole-grain breads. Multigrain cereal. Oats and oatmeal. Brown rice. Barley. Bulgur wheat. [place]. Quinoa. Bran muffins. Popcorn. Rye wafer crackers. Meats and other proteins Navy, kidney, and pinto beans. Soybeans. Split peas. Lentils. Nuts and seeds. Dairy Fiber-fortified yogurt. Beverages Fiber-fortified soy milk. Fiber-fortified orange juice. Other foods Fiber bars. The items listed above may not be a complete list of recommended foods and beverages. Contact a dietitian for more options. What foods are not recommended? Fruits Fruit juice. Cooked, strained fruit. Vegetables Fried potatoes. Canned vegetables. Well-cooked vegetables. Grains White bread. Pasta made with refined flour. White rice. Meats and other  proteins Fatty cuts of meat. Fried chicken or fried fish. Dairy Milk. Yogurt. Cream cheese. Sour cream. Fats and oils Butters. Beverages Soft drinks. Other foods Cakes and pastries. The items listed above may not be a complete list of foods  and beverages to avoid. Contact a dietitian for more information. Summary  Fiber is a type of carbohydrate. It is found in fruits, vegetables, whole grains, and beans.  There are many health benefits of eating a high-fiber diet, such as preventing constipation, lowering blood cholesterol, helping with weight loss, and reducing your risk of heart disease, diabetes, and certain cancers.  Gradually increase your intake of fiber. Increasing too fast can result in cramping, bloating, and gas. Drink plenty of water while you increase your fiber.  The best sources of fiber include whole fruits and vegetables, whole grains, nuts, seeds, and beans. This information is not intended to replace advice given to you by your health care provider. Make sure you discuss any questions you have with your health care provider. Document Revised: 08/17/2017 Document Reviewed: 08/17/2017 Elsevier Patient Education  2020 [employer].

## 2019-12-28 NOTE — Progress Notes (Signed)
Cheryl Flowers [address]  [place]  [place], [place] [address]  Main: [phone]  Fax: [phone]   Gastroenterology Consultation  Referring Provider:     Helayne Seminole, NP Primary Care Physician:  [name], DO Reason for Consultation:     Hematochezia        HPI:   Virtual Visit via Video Note  I connected with patient on 12/28/19 at  2:15 PM EST by video ([email or web address]) and verified that I am speaking with the correct person using two identifiers.   I discussed the limitations, risks, security and privacy concerns of performing an evaluation and management service by video and the availability of in person appointments. I also discussed with the patient that there may be a patient responsible charge related to this service. The patient expressed understanding and agreed to proceed.  Location of the patient: Home Location of provider: Home Participating persons: Patient and provider only (Nursing staff checked in patient via phone but were not physically involved in the video interaction - see their notes)   History of Present Illness: Chief Complaint  Patient presents with  . New Patient (Initial Visit)  . Rectal Bleeding    Patient states that the rectal bleeding comes and goes. It happens 2-3 days a week when she wipes but is in stool all the time   . Bloated  . Abdominal Pain    Patient has had abdominal cramping and nausea that comes and goes. Patient is having loose stoools     Cheryl Flowers is a 39 y.o. y/o female referred for consultation & management  by Dr. Wynetta Emery, Megan P, DO.  Patient reports 1 year history of intermittent hematochezia with blood on the tissue paper and sometimes in the toilet bowl as well.  No weight loss.  No abdominal pain.  Has felt some hemorrhoids in the past and used Preparation H.  For the last 1 month stools have been more loose than 3-4 bowel movements a day compared to 1-2 formed bowel movements a day  before.  No recent stool testing.  No family history of colon cancer.  No prior colonoscopy.  Was previously constipated and was taking stool softeners which she has not done in 1 month.  Past Medical History:  Diagnosis Date  . Anxiety   . Depression   . Low back pain   . Marital conflict   . Panic disorder   . Thyromegaly     Past Surgical History:  Procedure Laterality Date  . CESAREAN SECTION  2003 and 2010  . CHOLECYSTECTOMY  2007  . TUBAL LIGATION  2010    Prior to Admission medications   Medication Sig Start Date End Date Taking? Authorizing Provider  albuterol (VENTOLIN HFA) 108 (90 Base) MCG/ACT inhaler Inhale 1 puff into the lungs every 6 (six) hours as needed for wheezing or shortness of breath. 12/27/19  Yes Flora Lipps, MD  amphetamine-dextroamphetamine (ADDERALL XR) 15 MG 24 hr capsule Take 1 capsule by mouth every morning. 12/06/19 01/05/20 Yes Johnson, Megan P, DO  DULoxetine (CYMBALTA) 60 MG capsule Take 1 capsule (60 mg total) by mouth daily. 10/07/19  Yes Johnson, Megan P, DO  Fluticasone-Salmeterol (ADVAIR DISKUS) 250-50 MCG/DOSE AEPB Inhale 1 puff into the lungs 2 (two) times daily. 12/27/19 12/26/20 Yes Flora Lipps, MD  metoprolol succinate (TOPROL-XL) 25 MG 24 hr tablet Take 1 tablet (25 mg total) by mouth daily. 10/07/19  Yes Johnson, Megan P, DO  SUMAtriptan (IMITREX)  100 MG tablet Take by mouth.   Yes [provider]  triamterene-hydrochlorothiazide (MAXZIDE-25) 37.5-25 MG tablet Take 0.5 tablets by mouth daily. 10/07/19  Yes Johnson, Megan P, DO    Family History  Problem Relation Age of Onset  . Hypertension Mother   . Rheum arthritis Mother   . Diabetes Father   . Breast cancer Maternal Aunt        early 75  . Crohn's disease Maternal Aunt      Social History   Tobacco Use  . Smoking status: Former Smoker    Packs/day: 0.25    Years: 19.00    Pack years: 4.75    Types: Cigarettes    Quit date: 09/18/2019    Years since quitting: 0.2  .  Smokeless tobacco: Never Used  Substance Use Topics  . Alcohol use: Not Currently  . Drug use: No    Allergies as of 12/28/2019 - Review Complete 12/28/2019  Allergen Reaction Noted  . Morphine and related Itching 12/15/2015  . Sulfa antibiotics Hives 12/15/2015    Review of Systems:    All systems reviewed and negative except where noted in HPI.   Observations/Objective:  Labs: CBC    Component Value Date/Time   WBC 8.7 12/20/2019 1624   WBC 9.1 02/20/2018 0138   RBC 3.89 12/20/2019 1624   RBC 4.15 02/20/2018 0138   HGB 12.2 12/20/2019 1624   HCT CANCELED 12/20/2019 1640   PLT 343 12/20/2019 1624   MCV 93 12/20/2019 1624   MCH 31.4 12/20/2019 1624   MCH 31.7 02/20/2018 0138   MCHC 33.7 12/20/2019 1624   MCHC 34.1 02/20/2018 0138   RDW 14.2 12/20/2019 1624   LYMPHSABS 2.9 12/20/2019 1624   MONOABS 0.4 02/20/2018 0138   EOSABS 0.2 04/05/2019 1103   BASOSABS 0.0 04/05/2019 1103   CMP     Component Value Date/Time   NA 141 12/20/2019 1640   K 4.2 12/20/2019 1640   CL 107 (H) 12/20/2019 1640   CO2 23 12/20/2019 1640   GLUCOSE 77 12/20/2019 1640   GLUCOSE 101 (H) 02/20/2018 0138   BUN 6 12/20/2019 1640   CREATININE 0.78 12/20/2019 1640   CALCIUM 9.1 12/20/2019 1640   PROT 6.9 12/20/2019 1640   ALBUMIN 4.2 12/20/2019 1640   AST 14 12/20/2019 1640   ALT 14 12/20/2019 1640   ALKPHOS 86 12/20/2019 1640   BILITOT <0.2 12/20/2019 1640   GFRNONAA 97 12/20/2019 1640   GFRAA 112 12/20/2019 1640    Imaging Studies: No results found.  Assessment and Plan:   ADDYLYNN BALIN is a 39 y.o. y/o female has been referred for intermittent hematochezia for 1 year, with loose stools for 1 month  Assessment and Plan: Rule out infectious causes since patient is having new diarrhea for the last 1 month  Check fecal calprotectin  Intermittent hematochezia for 1 year, with previous history of constipation and hemorrhoids that she feels, and normal hemoglobin  despite 1 year of symptoms, is most consistent with likely hemorrhoids being the etiology.  We will try treatment with steroid suppository.  Reassess in the next few weeks and schedule colonoscopy if GI profile negative for infectious causes  Pt advised to abstain from foods or beverages with sugar such as fruit juices, candy, sodas etc.   Follow Up Instructions: 4 to 6 weeks  I discussed the assessment and treatment plan with the patient. The patient was provided an opportunity to ask questions and all were answered. The patient  agreed with the plan and demonstrated an understanding of the instructions.   The patient was advised to call back or seek an in-person evaluation if the symptoms worsen or if the condition fails to improve as anticipated.  I provided 15 minutes of face-to-face time via video software during this encounter.  Additional time was spent in reviewing patient's chart, placing orders etc.   Virgel Manifold, MD  Speech recognition software was used to dictate the above note.

## 2020-01-17 ENCOUNTER — Encounter: Payer: Self-pay | Admitting: Family Medicine

## 2020-01-17 ENCOUNTER — Other Ambulatory Visit: Payer: Self-pay

## 2020-01-17 ENCOUNTER — Ambulatory Visit (INDEPENDENT_AMBULATORY_CARE_PROVIDER_SITE_OTHER): Payer: Medicaid Other | Admitting: Family Medicine

## 2020-01-17 VITALS — BP 118/80 | HR 84 | Temp 98.1°F

## 2020-01-17 DIAGNOSIS — Z13220 Encounter for screening for lipoid disorders: Secondary | ICD-10-CM | POA: Diagnosis not present

## 2020-01-17 DIAGNOSIS — F410 Panic disorder [episodic paroxysmal anxiety] without agoraphobia: Secondary | ICD-10-CM

## 2020-01-17 DIAGNOSIS — I10 Essential (primary) hypertension: Secondary | ICD-10-CM

## 2020-01-17 DIAGNOSIS — F331 Major depressive disorder, recurrent, moderate: Secondary | ICD-10-CM | POA: Diagnosis not present

## 2020-01-17 DIAGNOSIS — F411 Generalized anxiety disorder: Secondary | ICD-10-CM

## 2020-01-17 DIAGNOSIS — F988 Other specified behavioral and emotional disorders with onset usually occurring in childhood and adolescence: Secondary | ICD-10-CM | POA: Diagnosis not present

## 2020-01-17 DIAGNOSIS — K921 Melena: Secondary | ICD-10-CM

## 2020-01-17 LAB — UA/M W/RFLX CULTURE, ROUTINE
Bilirubin, UA: NEGATIVE
Glucose, UA: NEGATIVE
Ketones, UA: NEGATIVE
Leukocytes,UA: NEGATIVE
Nitrite, UA: NEGATIVE
Protein,UA: NEGATIVE
Specific Gravity, UA: 1.02 (ref 1.005–1.030)
Urobilinogen, Ur: 2 mg/dL — ABNORMAL HIGH (ref 0.2–1.0)
pH, UA: 7 (ref 5.0–7.5)

## 2020-01-17 LAB — MICROSCOPIC EXAMINATION: WBC, UA: NONE SEEN /hpf (ref 0–5)

## 2020-01-17 LAB — MICROALBUMIN, URINE WAIVED
Creatinine, Urine Waived: 200 mg/dL (ref 10–300)
Microalb, Ur Waived: 10 mg/L (ref 0–19)
Microalb/Creat Ratio: <30 mg/g (ref <30)

## 2020-01-17 MED ORDER — TRIAMTERENE-HCTZ 37.5-25 MG PO TABS: 0.5000 | ORAL_TABLET | Freq: Every day | ORAL | 1 refills | Status: DC

## 2020-01-17 MED ORDER — AMPHETAMINE-DEXTROAMPHET ER 15 MG PO CP24: 15.0000 mg | ORAL_CAPSULE | ORAL | 0 refills | Status: DC

## 2020-01-17 MED ORDER — ALBUTEROL SULFATE HFA 108 (90 BASE) MCG/ACT IN AERS: 1.0000 | INHALATION_SPRAY | Freq: Four times a day (QID) | RESPIRATORY_TRACT | 3 refills | Status: DC | PRN

## 2020-01-17 MED ORDER — QUETIAPINE FUMARATE ER 50 MG PO TB24: 50.0000 mg | ORAL_TABLET | Freq: Every day | ORAL | 3 refills | Status: DC

## 2020-01-17 MED ORDER — METOPROLOL SUCCINATE ER 25 MG PO TB24: 25.0000 mg | ORAL_TABLET | Freq: Every day | ORAL | 0 refills | Status: DC

## 2020-01-17 MED ORDER — FLUTICASONE-SALMETEROL 250-50 MCG/DOSE IN AEPB: 1.0000 | INHALATION_SPRAY | Freq: Two times a day (BID) | RESPIRATORY_TRACT | 3 refills | Status: DC

## 2020-01-17 MED ORDER — DULOXETINE HCL 60 MG PO CPEP: 60.0000 mg | ORAL_CAPSULE | Freq: Every day | ORAL | 1 refills | Status: DC

## 2020-01-17 NOTE — Assessment & Plan Note (Signed)
Not doing well. Would like to see psychiatry. Referral generated today. Will start seroquel at bedtime. Call with any concerns. Continue to monitor.

## 2020-01-17 NOTE — Assessment & Plan Note (Signed)
Continue to work on diet and exercise with goal of losing 1-2lbs per week. Call with any concerns.

## 2020-01-17 NOTE — Progress Notes (Signed)
BP 118/80 (BP Location: Left Arm, Patient Position: Sitting, Cuff Size: Normal)   Pulse 84   Temp 98.1 F (36.7 C) (Oral)   SpO2 99%    Subjective:    Patient ID: Cheryl Flowers, female    DOB: 1981/02/18, 39 y.o.   MRN: [id number]  HPI: Cheryl Flowers is a 39 y.o. female  Chief Complaint  Patient presents with  . Depression  . ADHD  . Hypertension   HYPERTENSION Hypertension status: controlled  Satisfied with current treatment? yes Duration of hypertension: chronic BP monitoring frequency:  not checking BP medication side effects:  no Medication compliance: excellent compliance Previous BP meds:triamterene-HCTZ, metoprolol Aspirin: no Recurrent headaches: no Visual changes: no Palpitations: yes Dyspnea: no Chest pain: no Lower extremity edema: no Dizzy/lightheaded: yes  DEPRESSION Mood status: stable- anxiety a lot worse Satisfied with current treatment?: no Symptom severity: moderate  Duration of current treatment : chronic Side effects: no Medication compliance: good compliance Psychotherapy/counseling: no  Previous psychiatric medications: cymbalta Depressed mood: yes Anxious mood: yes Anhedonia: no Significant weight loss or gain: no Insomnia: yes hard to stay asleep Fatigue: yes Feelings of worthlessness or guilt: yes Impaired concentration/indecisiveness: yes Suicidal ideations: no Hopelessness: no Crying spells: no Depression screen [facility] 2/9 01/17/2020 01/17/2020 10/07/2019 05/10/2019 01/06/2019  Decreased Interest 1 1 1 2  0  Down, Depressed, Hopeless 0 0 0 1 0  PHQ - 2 Score 1 1 1 3  0  Altered sleeping 2 2 3 3  -  Tired, decreased energy 2 2 2 3 3   Change in appetite 0 0 0 0 2  Feeling bad or failure about yourself  0 0 0 1 0  Trouble concentrating 3 3 1 2 2   Moving slowly or fidgety/restless 0 0 0 2 1  Suicidal thoughts 0 0 0 0 0  PHQ-9 Score 8 8 7 14  -  Difficult doing work/chores - Somewhat difficult Somewhat difficult - -   Some recent data might be hidden   GAD 7 : Generalized Anxiety Score 01/17/2020 01/17/2020 10/07/2019 05/10/2019  Nervous, Anxious, on Edge 2 2 1 3   Control/stop worrying - 0 0 0  Worry too much - different things 1 1 0 1  Trouble relaxing 0 0 1 3  Restless 0 0 0 1  Easily annoyed or irritable 3 3 3 3   Afraid - awful might happen 2 2 1 3   Total GAD 7 Score - 8 6 14   Anxiety Difficulty Somewhat difficult Somewhat difficult Somewhat difficult Very difficult    ADHD FOLLOW UP ADHD status: controlled Satisfied with current therapy: yes Medication compliance:  excellent compliance Controlled substance contract: yes Previous psychiatry evaluation: no Previous medications: no    Taking meds on weekends/vacations: yes Work/school performance:  good Difficulty sustaining attention/completing tasks: yes Distracted by extraneous stimuli: yes Does not listen when spoken to: no  Fidgets with hands or feet: no Unable to stay in seat: no Blurts out/interrupts others: no ADHD Medication Side Effects: no    Decreased appetite: no    Headache: no    Sleeping disturbance pattern: no    Irritability: no    Rebound effects (worse than baseline) off medication: no    Anxiousness: no    Dizziness: no    Tics: no   Relevant past medical, surgical, family and social history reviewed and updated as indicated. Interim medical history since our last visit reviewed. Allergies and medications reviewed and updated.  Review of Systems  Constitutional: Negative.  HENT: Negative.   Respiratory: Negative.   Cardiovascular: Positive for palpitations. Negative for chest pain and leg swelling.  Gastrointestinal: Negative.   Musculoskeletal: Negative.   Skin: Negative.   Psychiatric/Behavioral: Positive for agitation, decreased concentration and dysphoric mood. Negative for behavioral problems, confusion, hallucinations, self-injury, sleep disturbance and suicidal ideas. The patient is nervous/anxious  and is hyperactive.     Per HPI unless specifically indicated above     Objective:    BP 118/80 (BP Location: Left Arm, Patient Position: Sitting, Cuff Size: Normal)   Pulse 84   Temp 98.1 F (36.7 C) (Oral)   SpO2 99%   Wt Readings from Last 3 Encounters:  12/20/19 214 lb (97.1 kg)  05/10/19 211 lb (95.7 kg)  04/05/19 205 lb (93 kg)    Physical Exam Vitals and nursing note reviewed.  Constitutional:      General: She is not in acute distress.    Appearance: Normal appearance. She is not ill-appearing, toxic-appearing or diaphoretic.  HENT:     Head: Normocephalic and atraumatic.     Right Ear: External ear normal.     Left Ear: External ear normal.     Nose: Nose normal.     Mouth/Throat:     Mouth: Mucous membranes are moist.     Pharynx: Oropharynx is clear.  Eyes:     General: No scleral icterus.       Right eye: No discharge.        Left eye: No discharge.     Extraocular Movements: Extraocular movements intact.     Conjunctiva/sclera: Conjunctivae normal.     Pupils: Pupils are equal, round, and reactive to light.  Cardiovascular:     Rate and Rhythm: Normal rate and regular rhythm.     Pulses: Normal pulses.     Heart sounds: Normal heart sounds. No murmur. No friction rub. No gallop.   Pulmonary:     Effort: Pulmonary effort is normal. No respiratory distress.     Breath sounds: Normal breath sounds. No stridor. No wheezing, rhonchi or rales.  Chest:     Chest wall: No tenderness.  Musculoskeletal:        General: Normal range of motion.     Cervical back: Normal range of motion and neck supple.  Skin:    General: Skin is warm and dry.     Capillary Refill: Capillary refill takes less than 2 seconds.     Coloration: Skin is not jaundiced or pale.     Findings: No bruising, erythema, lesion or rash.  Neurological:     General: No focal deficit present.     Mental Status: She is alert and oriented to person, place, and time. Mental status is at baseline.   Psychiatric:        Mood and Affect: Mood normal.        Behavior: Behavior normal.        Thought Content: Thought content normal.        Judgment: Judgment normal.     Results for orders placed or performed in visit on 12/20/19  Sed Rate (ESR)  Result Value Ref Range   Sed Rate CANCELED mm/hr  Comp Met (CMET)  Result Value Ref Range   Glucose 77 65 - 99 mg/dL   BUN 6 6 - 20 mg/dL   Creatinine, Ser 0.78 0.57 - 1.00 mg/dL   GFR calc non Af Amer 97 >59 mL/min/1.73   GFR calc Af Amer 112 >59 mL/min/1.73  BUN/Creatinine Ratio 8 (L) 9 - 23   Sodium 141 134 - 144 mmol/L   Potassium 4.2 3.5 - 5.2 mmol/L   Chloride 107 (H) 96 - 106 mmol/L   CO2 23 20 - 29 mmol/L   Calcium 9.1 8.7 - 10.2 mg/dL   Total Protein 6.9 6.0 - 8.5 g/dL   Albumin 4.2 3.8 - 4.8 g/dL   Globulin, Total 2.7 1.5 - 4.5 g/dL   Albumin/Globulin Ratio 1.6 1.2 - 2.2   Bilirubin Total <0.2 0.0 - 1.2 mg/dL   Alkaline Phosphatase 86 39 - 117 IU/L   AST 14 0 - 40 IU/L   ALT 14 0 - 32 IU/L  CBC With Differential/Platelet  Result Value Ref Range   WBC 8.7 3.4 - 10.8 x10E3/uL   RBC 3.89 3.77 - 5.28 x10E6/uL   Hemoglobin 12.2 11.1 - 15.9 g/dL   Hematocrit 36.2 34.0 - 46.6 %   MCV 93 79 - 97 fL   MCH 31.4 26.6 - 33.0 pg   MCHC 33.7 31.5 - 35.7 g/dL   RDW 14.2 11.7 - 15.4 %   Platelets 343 150 - 450 x10E3/uL   Neutrophils 61 Not Estab. %   Lymphs 34 Not Estab. %   MID 5 Not Estab. %   Neutrophils Absolute 5.4 1.4 - 7.0 x10E3/uL   Lymphocytes Absolute 2.9 0.7 - 3.1 x10E3/uL   MID (Absolute) 0.4 0.1 - 1.6 X10E3/uL  Anemia panel  Result Value Ref Range   Total Iron Binding Capacity 291 250 - 450 ug/dL   UIBC 242 131 - 425 ug/dL   Iron 49 27 - 159 ug/dL   Iron Saturation 17 15 - 55 %   Vitamin B-12 1,098 232 - 1,245 pg/mL   Folate, Hemolysate 315.0 Not Estab. ng/mL   Hematocrit CANCELED %   Hematology Comments: CANCELED    Folate, RBC CANCELED ng/mL   Ferritin 28 15 - 150 ng/mL   Retic Ct Pct CANCELED %       Assessment & Plan:   Problem List Items Addressed This Visit      Cardiovascular and Mediastinum   Essential hypertension - Primary    ? Running a little low at home. Will check at home and if she is low will stop triamterene. Call with any concerns. Rechecking labs today.       Relevant Medications   triamterene-hydrochlorothiazide (MAXZIDE-25) 37.5-25 MG tablet   metoprolol succinate (TOPROL-XL) 25 MG 24 hr tablet   Other Relevant Orders   Comprehensive metabolic panel   Microalbumin, Urine Waived   CBC with Differential/Platelet   TSH   UA/M w/rflx Culture, Routine     Other   Generalized anxiety disorder with panic attacks    Not doing well. Would like to see psychiatry. Referral generated today. Will start seroquel at bedtime. Call with any concerns. Continue to monitor.      Relevant Medications   traZODone (DESYREL) 100 MG tablet   DULoxetine (CYMBALTA) 60 MG capsule   Other Relevant Orders   CBC with Differential/Platelet   Ambulatory referral to Psychiatry   Morbid obesity ([place])    Continue to work on diet and exercise with goal of losing 1-2lbs per week. Call with any concerns.       Relevant Medications   amphetamine-dextroamphetamine (ADDERALL XR) 15 MG 24 hr capsule   amphetamine-dextroamphetamine (ADDERALL XR) 15 MG 24 hr capsule (Start on 02/16/2020)   amphetamine-dextroamphetamine (ADDERALL XR) 15 MG 24 hr capsule (Start on 03/17/2020)  Attention deficit disorder (ADD) in adult    Under good control on current regimen. Continue current regimen. Continue to monitor. Call with any concerns. Refills given x3 months. Follow up 3 months.         Relevant Orders   CBC with Differential/Platelet   Ambulatory referral to Psychiatry   Moderate episode of recurrent major depressive disorder ([place])    Not doing well. Would like to see psychiatry. Referral generated today. Will start seroquel at bedtime. Call with any concerns. Continue to monitor.      Relevant  Medications   traZODone (DESYREL) 100 MG tablet   DULoxetine (CYMBALTA) 60 MG capsule   Other Relevant Orders   CBC with Differential/Platelet   Ambulatory referral to Psychiatry    Other Visit Diagnoses    Screening for cholesterol level       Labs drawn today. Await results.    Relevant Orders   Lipid Panel w/o Chol/HDL Ratio       Follow up plan: Return in about 3 months (around 04/18/2020) for 2 appts due to insurance. 1 physical after 04/04/20, other ADD follow up before 04/16/20.

## 2020-01-17 NOTE — Assessment & Plan Note (Signed)
?   Running a little low at home. Will check at home and if she is low will stop triamterene. Call with any concerns. Rechecking labs today.

## 2020-01-17 NOTE — Assessment & Plan Note (Signed)
Under good control on current regimen. Continue current regimen. Continue to monitor. Call with any concerns. Refills given x3 months. Follow up 3 months.

## 2020-01-18 LAB — CBC WITH DIFFERENTIAL/PLATELET
Basophils Absolute: 0 10*3/uL (ref 0.0–0.2)
Basos: 1 %
EOS (ABSOLUTE): 0.2 10*3/uL (ref 0.0–0.4)
Eos: 2 %
Hematocrit: 35.6 % (ref 34.0–46.6)
Hemoglobin: 11.6 g/dL (ref 11.1–15.9)
Immature Grans (Abs): 0 10*3/uL (ref 0.0–0.1)
Immature Granulocytes: 0 %
Lymphocytes Absolute: 2.2 10*3/uL (ref 0.7–3.1)
Lymphs: 32 %
MCH: 30.4 pg (ref 26.6–33.0)
MCHC: 32.6 g/dL (ref 31.5–35.7)
MCV: 93 fL (ref 79–97)
Monocytes Absolute: 0.4 10*3/uL (ref 0.1–0.9)
Monocytes: 5 %
Neutrophils Absolute: 4.2 10*3/uL (ref 1.4–7.0)
Neutrophils: 60 %
Platelets: 333 10*3/uL (ref 150–450)
RBC: 3.81 x10E6/uL (ref 3.77–5.28)
RDW: 12.6 % (ref 11.7–15.4)
WBC: 7 10*3/uL (ref 3.4–10.8)

## 2020-01-18 LAB — COMPREHENSIVE METABOLIC PANEL
ALT: 13 IU/L (ref 0–32)
AST: 16 IU/L (ref 0–40)
Albumin/Globulin Ratio: 1.3 (ref 1.2–2.2)
Albumin: 3.9 g/dL (ref 3.8–4.8)
Alkaline Phosphatase: 80 IU/L (ref 39–117)
BUN/Creatinine Ratio: 9 (ref 9–23)
BUN: 7 mg/dL (ref 6–20)
Bilirubin Total: 0.2 mg/dL (ref 0.0–1.2)
CO2: 21 mmol/L (ref 20–29)
Calcium: 8.8 mg/dL (ref 8.7–10.2)
Chloride: 107 mmol/L — ABNORMAL HIGH (ref 96–106)
Creatinine, Ser: 0.74 mg/dL (ref 0.57–1.00)
GFR calc Af Amer: 119 mL/min/{1.73_m2} (ref >59)
GFR calc non Af Amer: 103 mL/min/{1.73_m2} (ref >59)
Globulin, Total: 3.1 g/dL (ref 1.5–4.5)
Glucose: 86 mg/dL (ref 65–99)
Potassium: 3.8 mmol/L (ref 3.5–5.2)
Sodium: 142 mmol/L (ref 134–144)
Total Protein: 7 g/dL (ref 6.0–8.5)

## 2020-01-18 LAB — TSH: TSH: 0.569 u[IU]/mL (ref 0.450–4.500)

## 2020-01-18 LAB — LIPID PANEL W/O CHOL/HDL RATIO
Cholesterol, Total: 182 mg/dL (ref 100–199)
HDL: 52 mg/dL (ref >39)
LDL Chol Calc (NIH): 118 mg/dL — ABNORMAL HIGH (ref 0–99)
Triglycerides: 65 mg/dL (ref 0–149)
VLDL Cholesterol Cal: 12 mg/dL (ref 5–40)

## 2020-01-26 ENCOUNTER — Other Ambulatory Visit: Payer: Medicaid Other

## 2020-01-26 ENCOUNTER — Other Ambulatory Visit: Payer: Self-pay

## 2020-01-26 ENCOUNTER — Other Ambulatory Visit: Payer: Self-pay | Admitting: Family Medicine

## 2020-01-26 DIAGNOSIS — K921 Melena: Secondary | ICD-10-CM

## 2020-01-26 NOTE — Addendum Note (Signed)
Addended by: Sandria Manly on: 01/26/2020 02:34 PM   Modules accepted: Orders

## 2020-01-27 DIAGNOSIS — K921 Melena: Secondary | ICD-10-CM | POA: Diagnosis not present

## 2020-01-28 LAB — FECAL OCCULT BLOOD, IMMUNOCHEMICAL: Fecal Occult Bld: POSITIVE — AB

## 2020-02-03 ENCOUNTER — Other Ambulatory Visit: Payer: Self-pay

## 2020-02-03 DIAGNOSIS — R195 Other fecal abnormalities: Secondary | ICD-10-CM

## 2020-02-03 MED ORDER — NA SULFATE-K SULFATE-MG SULF 17.5-3.13-1.6 GM/177ML PO SOLN: 354.0000 mL | Freq: Once | ORAL | 0 refills | Status: AC

## 2020-02-03 NOTE — Telephone Encounter (Signed)
Called and left a message for call back  

## 2020-02-03 NOTE — Telephone Encounter (Signed)
Scheduled patient for 03/07/2020 with Dr. Bonna Gains

## 2020-02-10 DIAGNOSIS — M47816 Spondylosis without myelopathy or radiculopathy, lumbar region: Secondary | ICD-10-CM | POA: Diagnosis not present

## 2020-02-10 DIAGNOSIS — M5136 Other intervertebral disc degeneration, lumbar region: Secondary | ICD-10-CM | POA: Diagnosis not present

## 2020-02-22 DIAGNOSIS — M47816 Spondylosis without myelopathy or radiculopathy, lumbar region: Secondary | ICD-10-CM | POA: Diagnosis not present

## 2020-02-28 ENCOUNTER — Encounter: Payer: Self-pay | Admitting: Family Medicine

## 2020-02-29 ENCOUNTER — Other Ambulatory Visit: Payer: Self-pay

## 2020-02-29 ENCOUNTER — Telehealth (INDEPENDENT_AMBULATORY_CARE_PROVIDER_SITE_OTHER): Payer: Medicaid Other | Admitting: Family Medicine

## 2020-02-29 ENCOUNTER — Encounter: Payer: Self-pay | Admitting: Family Medicine

## 2020-02-29 DIAGNOSIS — F410 Panic disorder [episodic paroxysmal anxiety] without agoraphobia: Secondary | ICD-10-CM

## 2020-02-29 DIAGNOSIS — F411 Generalized anxiety disorder: Secondary | ICD-10-CM

## 2020-02-29 MED ORDER — BUPROPION HCL ER (SR) 150 MG PO TB12: ORAL_TABLET | ORAL | 3 refills | Status: DC

## 2020-02-29 MED ORDER — QUETIAPINE FUMARATE ER 50 MG PO TB24: 50.0000 mg | ORAL_TABLET | Freq: Every day | ORAL | 1 refills | Status: DC

## 2020-02-29 NOTE — Assessment & Plan Note (Signed)
Not doing well. Will start increasing her seroquel and add wellbutrin. Continue cymbalta. Call with any concerns. Recheck 2 weeks. We will fill out FMLA paperwork for her husband that he's out Friday- Monday and will give him intermittent leave 1-2 days every 1-2 weeks. Call with any concerns. Continue to monitor.

## 2020-02-29 NOTE — Progress Notes (Signed)
BP 132/78 (BP Location: Left Arm, Patient Position: Sitting, Cuff Size: Normal)   Pulse 98   Temp 98.4 F (36.9 C) (Oral)   SpO2 99%    Subjective:    Patient ID: Cheryl Flowers, female    DOB: [birth year], 39 y.o.   MRN: [id number]  HPI: [name] is a 39 y.o. female  Chief Complaint  Patient presents with  . Depression  . Anxiety  . Panic Attack   ANXIETY/DEPRESSION- has been doing really poorly for about the past week. Has been having family issues, but it has been weighing on her. She started the hydroxyzine again. She feels overwhelmed. Her husband stayed home for a couple of days to help her. Duration:exacerbated Anxious mood: yes  Excessive worrying: yes Irritability: no  Sweating: no Nausea: yes Palpitations:yes Hyperventilation: yes Panic attacks: yes Agoraphobia: no  Obscessions/compulsions: no Depressed mood: yes Depression screen [facility] 2/9 02/29/2020 01/17/2020 01/17/2020 10/07/2019 05/10/2019  Decreased Interest 3 1 1 1 2   Down, Depressed, Hopeless 3 0 0 0 1  PHQ - 2 Score 6 1 1 1 3   Altered sleeping 2 2 2 3 3   Tired, decreased energy 2 2 2 2 3   Change in appetite 2 0 0 0 0  Feeling bad or failure about yourself  3 0 0 0 1  Trouble concentrating 2 3 3 1 2   Moving slowly or fidgety/restless 1 0 0 0 2  Suicidal thoughts 2 0 0 0 0  PHQ-9 Score 20 8 8 7 14   Difficult doing work/chores Very difficult - Somewhat difficult Somewhat difficult -  Some recent data might be hidden   GAD 7 : Generalized Anxiety Score 02/29/2020 01/17/2020 01/17/2020 10/07/2019  Nervous, Anxious, on Edge 2 2 2 1   Control/stop worrying 2 - 0 0  Worry too much - different things 2 1 1  0  Trouble relaxing 2 0 0 1  Restless 1 0 0 0  Easily annoyed or irritable 3 3 3 3   Afraid - awful might happen 2 2 2 1   Total GAD 7 Score 14 - 8 6  Anxiety Difficulty Very difficult Somewhat difficult Somewhat difficult Somewhat difficult   Anhedonia: no Weight changes:  no Insomnia: yes hard to fall asleep  Hypersomnia: yes Fatigue/loss of energy: yes Feelings of worthlessness: yes Feelings of guilt: yes Impaired concentration/indecisiveness: yes Suicidal ideations: yes  Crying spells: yes Recent Stressors/Life Changes: no   Relationship problems: no   Family stress: no     Financial stress: no    Job stress: no    Recent death/loss: no  Relevant past medical, surgical, family and social history reviewed and updated as indicated. Interim medical history since our last visit reviewed. Allergies and medications reviewed and updated.  Review of Systems  Constitutional: Negative.   Respiratory: Negative.   Cardiovascular: Negative.   Gastrointestinal: Negative.   Musculoskeletal: Negative.   Psychiatric/Behavioral: Positive for agitation and dysphoric mood. Negative for behavioral problems, confusion, decreased concentration, hallucinations, self-injury and sleep disturbance. The patient is nervous/anxious. The patient is not hyperactive.     Per HPI unless specifically indicated above     Objective:    BP 132/78 (BP Location: Left Arm, Patient Position: Sitting, Cuff Size: Normal)   Pulse 98   Temp 98.4 F (36.9 C) (Oral)   SpO2 99%   Wt Readings from Last 3 Encounters:  12/20/19 214 lb (97.1 kg)  05/10/19 211 lb (95.7 kg)  04/05/19 205 lb (93 kg)  Physical Exam Vitals and nursing note reviewed.  Constitutional:      General: She is not in acute distress.    Appearance: Normal appearance. She is not ill-appearing, toxic-appearing or diaphoretic.  HENT:     Head: Normocephalic and atraumatic.     Right Ear: External ear normal.     Left Ear: External ear normal.     Nose: Nose normal.     Mouth/Throat:     Mouth: Mucous membranes are moist.     Pharynx: Oropharynx is clear.  Eyes:     General: No scleral icterus.       Right eye: No discharge.        Left eye: No discharge.     Conjunctiva/sclera: Conjunctivae normal.      Pupils: Pupils are equal, round, and reactive to light.  Pulmonary:     Effort: Pulmonary effort is normal. No respiratory distress.     Comments: Speaking in full sentences Musculoskeletal:        General: Normal range of motion.     Cervical back: Normal range of motion.  Skin:    Coloration: Skin is not jaundiced or pale.     Findings: No bruising, erythema, lesion or rash.  Neurological:     Mental Status: She is alert and oriented to person, place, and time. Mental status is at baseline.  Psychiatric:        Mood and Affect: Mood is anxious.        Behavior: Behavior normal.        Thought Content: Thought content normal.        Judgment: Judgment normal.     Results for orders placed or performed in visit on 01/26/20  Fecal occult blood, imunochemical([facility])   Specimen: Stool   ST  Result Value Ref Range   Fecal Occult Bld Positive (A) Negative      Assessment & Plan:   Problem List Items Addressed This Visit      Other   Generalized anxiety disorder with panic attacks    Not doing well. Will start increasing her seroquel and add wellbutrin. Continue cymbalta. Call with any concerns. Recheck 2 weeks. We will fill out FMLA paperwork for her husband that he's out Friday- Monday and will give him intermittent leave 1-2 days every 1-2 weeks. Call with any concerns. Continue to monitor.       Relevant Medications   buPROPion (WELLBUTRIN SR) 150 MG 12 hr tablet       Follow up plan: Return in about 2 weeks (around 03/14/2020).   . This visit was completed via MyChart due to the restrictions of the COVID-19 pandemic. All issues as above were discussed and addressed. Physical exam was done as above through visual confirmation on MyChart. If it was felt that the patient should be evaluated in the office, they were directed there. The patient verbally consented to this visit. . Location of the patient: home . Location of the provider: home . Those involved with  this call:  . Provider: Park Liter, DO . CMA: Merilyn Baba, CMA . Front Desk/Registration: Don Perking  . Time spent on call: 15 minutes with patient face to face via video conference. More than 50% of this time was spent in counseling and coordination of care. 23 minutes total spent in review of patient's record and preparation of their chart.

## 2020-03-01 ENCOUNTER — Telehealth: Payer: Self-pay | Admitting: Family Medicine

## 2020-03-01 NOTE — Telephone Encounter (Signed)
Called to schedule, no answer, left vm

## 2020-03-01 NOTE — Telephone Encounter (Signed)
-----   Message from [name], DO sent at 02/29/2020 11:00 AM EDT ----- 2 weeks, virtual OK

## 2020-03-02 ENCOUNTER — Encounter: Payer: Self-pay | Admitting: Family Medicine

## 2020-03-05 ENCOUNTER — Other Ambulatory Visit
Admission: RE | Admit: 2020-03-05 | Discharge: 2020-03-05 | Disposition: A | Discharge status: Home / Self Care | Payer: Medicaid Other | Source: Ambulatory Visit | Attending: Gastroenterology | Admitting: Gastroenterology

## 2020-03-05 ENCOUNTER — Other Ambulatory Visit: Payer: Self-pay

## 2020-03-05 DIAGNOSIS — Z20822 Contact with and (suspected) exposure to covid-19: Secondary | ICD-10-CM | POA: Insufficient documentation

## 2020-03-05 DIAGNOSIS — Z01812 Encounter for preprocedural laboratory examination: Secondary | ICD-10-CM | POA: Insufficient documentation

## 2020-03-05 LAB — SARS CORONAVIRUS 2 (TAT 6-24 HRS): SARS Coronavirus 2: NEGATIVE

## 2020-03-07 ENCOUNTER — Ambulatory Visit: Payer: Medicaid Other | Admitting: Anesthesiology

## 2020-03-07 ENCOUNTER — Ambulatory Visit
Admission: RE | Admit: 2020-03-07 | Discharge: 2020-03-07 | Disposition: A | Discharge status: Home / Self Care | Payer: Medicaid Other | Attending: Gastroenterology | Admitting: Gastroenterology

## 2020-03-07 ENCOUNTER — Encounter: Payer: Self-pay | Admitting: Gastroenterology

## 2020-03-07 ENCOUNTER — Encounter
Admission: RE | Disposition: A | Discharge status: Home / Self Care | Payer: Self-pay | Source: Home / Self Care | Attending: Gastroenterology

## 2020-03-07 DIAGNOSIS — F329 Major depressive disorder, single episode, unspecified: Secondary | ICD-10-CM | POA: Insufficient documentation

## 2020-03-07 DIAGNOSIS — Z8261 Family history of arthritis: Secondary | ICD-10-CM | POA: Diagnosis not present

## 2020-03-07 DIAGNOSIS — Z803 Family history of malignant neoplasm of breast: Secondary | ICD-10-CM | POA: Insufficient documentation

## 2020-03-07 DIAGNOSIS — Z833 Family history of diabetes mellitus: Secondary | ICD-10-CM | POA: Diagnosis not present

## 2020-03-07 DIAGNOSIS — F17210 Nicotine dependence, cigarettes, uncomplicated: Secondary | ICD-10-CM | POA: Insufficient documentation

## 2020-03-07 DIAGNOSIS — Z885 Allergy status to narcotic agent status: Secondary | ICD-10-CM | POA: Diagnosis not present

## 2020-03-07 DIAGNOSIS — Z8249 Family history of ischemic heart disease and other diseases of the circulatory system: Secondary | ICD-10-CM | POA: Insufficient documentation

## 2020-03-07 DIAGNOSIS — E010 Iodine-deficiency related diffuse (endemic) goiter: Secondary | ICD-10-CM | POA: Insufficient documentation

## 2020-03-07 DIAGNOSIS — M545 Low back pain: Secondary | ICD-10-CM | POA: Diagnosis not present

## 2020-03-07 DIAGNOSIS — Z882 Allergy status to sulfonamides status: Secondary | ICD-10-CM | POA: Diagnosis not present

## 2020-03-07 DIAGNOSIS — G709 Myoneural disorder, unspecified: Secondary | ICD-10-CM | POA: Insufficient documentation

## 2020-03-07 DIAGNOSIS — F410 Panic disorder [episodic paroxysmal anxiety] without agoraphobia: Secondary | ICD-10-CM | POA: Diagnosis not present

## 2020-03-07 DIAGNOSIS — K6289 Other specified diseases of anus and rectum: Secondary | ICD-10-CM | POA: Diagnosis not present

## 2020-03-07 DIAGNOSIS — K633 Ulcer of intestine: Secondary | ICD-10-CM | POA: Diagnosis not present

## 2020-03-07 DIAGNOSIS — Z79899 Other long term (current) drug therapy: Secondary | ICD-10-CM | POA: Insufficient documentation

## 2020-03-07 DIAGNOSIS — R195 Other fecal abnormalities: Secondary | ICD-10-CM | POA: Diagnosis not present

## 2020-03-07 DIAGNOSIS — I10 Essential (primary) hypertension: Secondary | ICD-10-CM | POA: Diagnosis not present

## 2020-03-07 DIAGNOSIS — Z9049 Acquired absence of other specified parts of digestive tract: Secondary | ICD-10-CM | POA: Diagnosis not present

## 2020-03-07 DIAGNOSIS — K529 Noninfective gastroenteritis and colitis, unspecified: Secondary | ICD-10-CM | POA: Insufficient documentation

## 2020-03-07 DIAGNOSIS — Z6834 Body mass index (BMI) 34.0-34.9, adult: Secondary | ICD-10-CM | POA: Diagnosis not present

## 2020-03-07 DIAGNOSIS — Z8379 Family history of other diseases of the digestive system: Secondary | ICD-10-CM | POA: Diagnosis not present

## 2020-03-07 DIAGNOSIS — F419 Anxiety disorder, unspecified: Secondary | ICD-10-CM | POA: Diagnosis not present

## 2020-03-07 HISTORY — PX: COLONOSCOPY WITH PROPOFOL: SHX5780

## 2020-03-07 SURGERY — COLONOSCOPY WITH PROPOFOL
Anesthesia: General

## 2020-03-07 MED ORDER — PROPOFOL 500 MG/50ML IV EMUL
INTRAVENOUS | Status: AC
  Filled 2020-03-07: qty 50

## 2020-03-07 MED ORDER — PROPOFOL 500 MG/50ML IV EMUL
INTRAVENOUS | Status: DC | PRN
  Administered 2020-03-07: 125 ug/kg/min via INTRAVENOUS

## 2020-03-07 MED ORDER — LIDOCAINE HCL (PF) 2 % IJ SOLN
INTRAMUSCULAR | Status: AC
  Filled 2020-03-07: qty 5

## 2020-03-07 MED ORDER — PHENYLEPHRINE HCL (PRESSORS) 10 MG/ML IV SOLN
INTRAVENOUS | Status: DC | PRN
  Administered 2020-03-07: 100 ug via INTRAVENOUS

## 2020-03-07 MED ORDER — PROPOFOL 10 MG/ML IV BOLUS
INTRAVENOUS | Status: DC | PRN
  Administered 2020-03-07: 20 mg via INTRAVENOUS
  Administered 2020-03-07 (×2): 30 mg via INTRAVENOUS
  Administered 2020-03-07: 70 mg via INTRAVENOUS

## 2020-03-07 MED ORDER — SODIUM CHLORIDE 0.9 % IV SOLN
INTRAVENOUS | Status: DC
  Administered 2020-03-07: 1000 mL via INTRAVENOUS

## 2020-03-07 MED ORDER — LIDOCAINE HCL (CARDIAC) PF 100 MG/5ML IV SOSY
PREFILLED_SYRINGE | INTRAVENOUS | Status: DC | PRN
  Administered 2020-03-07: 40 mg via INTRAVENOUS

## 2020-03-07 NOTE — Op Note (Addendum)
[facility] Patient Name: Cheryl Flowers Procedure Date: 03/07/2020 11:48 AM MRN: [id number] Account #: [id number] Date of Birth: 1980/12/20 Admit Type: Outpatient Age: 39 Room: [facility] ENDO ROOM 4 Gender: Female Note Status: Finalized Procedure:             Colonoscopy Indications:           Positive fecal immunochemical test Providers:             Ace Bergfeld B. Bonna Gains MD, MD Referring MD:          [name] (Referring MD) Medicines:             Monitored Anesthesia Care Complications:         No immediate complications. Procedure:             Pre-Anesthesia Assessment:                        - Prior to the procedure, a History and Physical was                         performed, and patient medications, allergies and                         sensitivities were reviewed. The patient's tolerance                         of previous anesthesia was reviewed.                        - The risks and benefits of the procedure and the                         sedation options and risks were discussed with the                         patient. All questions were answered and informed                         consent was obtained.                        - Patient identification and proposed procedure were                         verified prior to the procedure by the physician, the                         nurse, the anesthesiologist, the anesthetist and the                         technician. The procedure was verified in the                         pre-procedure area in the procedure room in the                         endoscopy suite.                        - Prophylactic Antibiotics: The patient  does not                         require prophylactic antibiotics.                        - ASA Grade Assessment: II - A patient with mild                         systemic disease.                        - After reviewing the risks and benefits, the  patient                         was deemed in satisfactory condition to undergo the                         procedure.                        - Monitored anesthesia care was determined to be                         medically necessary for this procedure based on review                         of the patient's medical history, medications, and                         prior anesthesia history.                        - The anesthesia plan was to use monitored anesthesia                         care (MAC).                        After obtaining informed consent, the colonoscope was                         passed under direct vision. Throughout the procedure,                         the patient's blood pressure, pulse, and oxygen                         saturations were monitored continuously. The                         Colonoscope was introduced through the anus and                         advanced to the the cecum, identified by appendiceal                         orifice and ileocecal valve. The colonoscopy was                         performed with ease. The  patient tolerated the                         procedure well. The quality of the bowel preparation                         was adequate. Findings:      The perianal and digital rectal examinations were normal.      Nonbleeding ulcerated mucosa with no stigmata of recent bleeding were       present at the ileocecal valve. Biopsies were taken with a cold forceps       for histology. Terminal ileum could not be intubated, due to ulceration       present just proximal to the Ileocecal valve      Patchy moderate inflammation characterized by altered vascularity,       erythema and shallow ulcerations was found in the cecum. Biopsies were       taken with a cold forceps for histology.      Patchy mild inflammation characterized by erythema and shallow       ulcerations was found in the transverse colon and in the ascending       colon.  Biopsies were taken with a cold forceps for histology.      The descending colon appeared normal.      A patchy area of mildly erythematous mucosa was found in the rectum and       in the sigmoid colon. Biopsies were taken with a cold forceps for       histology.      The retroflexed view of the distal rectum and anal verge was normal and       showed no anal or rectal abnormalities. Impression:            - Mucosal ulceration. Biopsied.                        - Patchy moderate inflammation was found in the cecum,                         rule out Crohn's disease. Biopsied.                        - Patchy mild inflammation was found in the transverse                         colon and in the ascending colon, rule out Crohn's                         disease. Biopsied.                        - The descending colon is normal.                        - Erythematous mucosa in the rectum and in the sigmoid                         colon. Biopsied.                        - The distal rectum and anal verge are normal on  retroflexion view. Recommendation:        - Await pathology results.                        - Return to my office in 1 week.                        - Avoid NSAIDs except Aspirin if medically indicated                         by PCP                        - Return to primary care physician in 4 weeks.                        - The findings and recommendations were discussed with                         the patient.                        - The findings and recommendations were discussed with                         the patient's family.                        - Continue present medications. Procedure Code(s):     --- Professional ---                        [phone], Colonoscopy, flexible; with biopsy, single or                         multiple Diagnosis Code(s):     --- Professional ---                        K63.3, Ulcer of intestine                         K52.9, Noninfective gastroenteritis and colitis,                         unspecified                        K62.89, Other specified diseases of anus and rectum                        K63.89, Other specified diseases of intestine                        R19.5, Other fecal abnormalities CPT copyright 2019 American Medical Association. All rights reserved. The codes documented in this report are preliminary and upon coder review may  be revised to meet current compliance requirements.  [name], MD Margretta Sidle B. Bonna Gains MD, MD 03/07/2020 12:30:23 PM This report has been signed electronically. Number of Addenda: 0 Note Initiated On: 03/07/2020 11:48 AM Scope Withdrawal Time: 0 hours 19 minutes 37 seconds  Total Procedure Duration: 0 hours 24 minutes 10 seconds  Estimated Blood Loss:  Estimated  blood loss: none.      [facility]

## 2020-03-07 NOTE — Transfer of Care (Signed)
Immediate Anesthesia Transfer of Care Note  Patient: Cheryl Flowers  Procedure(s) Performed: COLONOSCOPY WITH PROPOFOL (N/A )  Patient Location: PACU  Anesthesia Type:General  Level of Consciousness: sedated  Airway & Oxygen Therapy: Patient Spontanous Breathing  Post-op Assessment: Report given to RN and Post -op Vital signs reviewed and stable  Post vital signs: Reviewed and stable  Last Vitals:  Vitals Value Taken Time  BP 96/64 03/07/20 1223  Temp 36.3 C 03/07/20 1223  Pulse 85 03/07/20 1224  Resp 22 03/07/20 1224  SpO2 100 % 03/07/20 1224    Last Pain:  Vitals:   03/07/20 1223  TempSrc: Temporal  PainSc: Asleep         Complications: No apparent anesthesia complications

## 2020-03-07 NOTE — Anesthesia Preprocedure Evaluation (Addendum)
Anesthesia Evaluation  Patient identified by MRN, date of birth, ID band Patient awake    Reviewed: Allergy & Precautions, H&P , NPO status , Patient's Chart, lab work & pertinent test results  History of Anesthesia Complications Negative for: history of anesthetic complications  Airway Mallampati: II  TM Distance: >3 FB Neck ROM: full    Dental  (+) Chipped, Poor Dentition, Missing, Partial Upper   Pulmonary Current Smoker and Patient abstained from smoking.,    Pulmonary exam normal        Cardiovascular hypertension, (-) angina(-) Past MI and (-) DOE Normal cardiovascular exam     Neuro/Psych PSYCHIATRIC DISORDERS  Neuromuscular disease negative psych ROS   GI/Hepatic negative GI ROS, Neg liver ROS, neg GERD  ,  Endo/Other  negative endocrine ROS  Renal/GU      Musculoskeletal   Abdominal   Peds  Hematology negative hematology ROS (+)   Anesthesia Other Findings Past Medical History: No date: Anxiety No date: Depression No date: Low back pain No date: Marital conflict No date: Panic disorder No date: Thyromegaly  Past Surgical History: 2003 and 2010: CESAREAN SECTION 2007: CHOLECYSTECTOMY 2010: TUBAL LIGATION  BMI    Body Mass Index: 34.74 kg/m      Reproductive/Obstetrics negative OB ROS                            Anesthesia Physical Anesthesia Plan  ASA: III  Anesthesia Plan: General   Post-op Pain Management:    Induction: Intravenous  PONV Risk Score and Plan: Dexamethasone, Ondansetron, Midazolam and Treatment may vary due to age or medical condition  Airway Management Planned: Natural Airway and Nasal Cannula  Additional Equipment:   Intra-op Plan:   Post-operative Plan:   Informed Consent: I have reviewed the patients History and Physical, chart, labs and discussed the procedure including the risks, benefits and alternatives for the proposed anesthesia  with the patient or authorized representative who has indicated his/her understanding and acceptance.     Dental Advisory Given  Plan Discussed with: Anesthesiologist, CRNA and Surgeon  Anesthesia Plan Comments: (Patient consented for risks of anesthesia including but not limited to:  - adverse reactions to medications - risk of intubation if required - damage to eyes, teeth, lips or other oral mucosa - nerve damage due to positioning  - sore throat or hoarseness - Damage to heart, brain, nerves, lungs or loss of life  Patient voiced understanding.)        Anesthesia Quick Evaluation

## 2020-03-07 NOTE — Anesthesia Postprocedure Evaluation (Signed)
Anesthesia Post Note  Patient: Cheryl Flowers  Procedure(s) Performed: COLONOSCOPY WITH PROPOFOL (N/A )  Patient location during evaluation: Endoscopy Anesthesia Type: General Level of consciousness: awake and alert Pain management: pain level controlled Vital Signs Assessment: post-procedure vital signs reviewed and stable Respiratory status: spontaneous breathing, nonlabored ventilation, respiratory function stable and patient connected to nasal cannula oxygen Cardiovascular status: blood pressure returned to baseline and stable Postop Assessment: no apparent nausea or vomiting Anesthetic complications: no     Last Vitals:  Vitals:   03/07/20 1243 03/07/20 1251  BP: 110/75 101/72  Pulse: 86 69  Resp: 19 16  Temp:    SpO2: 100% 100%    Last Pain:  Vitals:   03/07/20 1251  TempSrc:   PainSc: 0-No pain                 Precious Haws Scottie Stanish

## 2020-03-07 NOTE — H&P (Signed)
[name], MD [address], [place], [place], [place], [address] [address], [place], [place], [place], [address] Phone: [phone]  Fax: [phone]  Primary Care Physician:  [name], DO   Pre-Procedure History & Physical: HPI:  Cheryl Flowers is a 39 y.o. female is here for a colonoscopy.   Past Medical History:  Diagnosis Date  . Anxiety   . Depression   . Low back pain   . Marital conflict   . Panic disorder   . Thyromegaly     Past Surgical History:  Procedure Laterality Date  . CESAREAN SECTION  2003 and 2010  . CHOLECYSTECTOMY  2007  . TUBAL LIGATION  2010    Prior to Admission medications   Medication Sig Start Date End Date Taking? Authorizing Provider  albuterol (VENTOLIN HFA) 108 (90 Base) MCG/ACT inhaler Inhale 1 puff into the lungs every 6 (six) hours as needed for wheezing or shortness of breath. 01/17/20  Yes Johnson, Megan P, DO  amphetamine-dextroamphetamine (ADDERALL XR) 15 MG 24 hr capsule Take 1 capsule by mouth every morning. 02/16/20 03/17/20 Yes Johnson, Megan P, DO  amphetamine-dextroamphetamine (ADDERALL XR) 15 MG 24 hr capsule Take 1 capsule by mouth every morning. 03/17/20 04/16/20 Yes Johnson, Megan P, DO  buPROPion (WELLBUTRIN SR) 150 MG 12 hr tablet 1 tab qAM for 3 days, then 1 tab BID after that 02/29/20  Yes Johnson, Megan P, DO  DULoxetine (CYMBALTA) 60 MG capsule Take 1 capsule (60 mg total) by mouth daily. 01/17/20  Yes Johnson, Megan P, DO  Fluticasone-Salmeterol (ADVAIR DISKUS) 250-50 MCG/DOSE AEPB Inhale 1 puff into the lungs 2 (two) times daily. 01/17/20 01/16/21 Yes Johnson, Megan P, DO  methocarbamol (ROBAXIN) 500 MG tablet 1/2-1 po qBID prn 02/10/20  Yes [provider]  metoprolol succinate (TOPROL-XL) 25 MG 24 hr tablet Take 1 tablet (25 mg total) by mouth daily. 01/17/20  Yes Johnson, Megan P, DO  QUEtiapine (SEROQUEL XR) 50 MG TB24 24 hr tablet Take 1 tablet (50 mg total) by mouth at bedtime. 02/29/20   Yes Johnson, Megan P, DO  SUMAtriptan (IMITREX) 100 MG tablet Take by mouth.   Yes [provider]  traZODone (DESYREL) 100 MG tablet Take 100 mg by mouth at bedtime.   Yes [provider]  triamterene-hydrochlorothiazide (MAXZIDE-25) 37.5-25 MG tablet Take 0.5 tablets by mouth daily. 01/17/20  Yes Johnson, Megan P, DO  amphetamine-dextroamphetamine (ADDERALL XR) 15 MG 24 hr capsule Take 1 capsule by mouth every morning. 01/17/20 02/16/20  Park Liter P, DO    Allergies as of 02/03/2020 - Review Complete 01/17/2020  Allergen Reaction Noted  . Morphine and related Itching 12/15/2015  . Sulfa antibiotics Hives 12/15/2015    Family History  Problem Relation Age of Onset  . Hypertension Mother   . Rheum arthritis Mother   . Diabetes Father   . Breast cancer Maternal Aunt        early 34  . Crohn's disease Maternal Aunt     Social History   Socioeconomic History  . Marital status: Married    Spouse name: Not on file  . Number of children: Not on file  . Years of education: Not on file  . Highest education level: Not on file  Occupational History  . Not on file  Tobacco Use  . Smoking status: Current Every Day Smoker    Packs/day: 0.25    Years: 19.00    Pack years: 4.75    Types: Cigarettes  Last attempt to quit: 09/18/2019    Years since quitting: 0.4  . Smokeless tobacco: Never Used  Substance and Sexual Activity  . Alcohol use: Not Currently  . Drug use: No  . Sexual activity: Not on file  Other Topics Concern  . Not on file  Social History Narrative  . Not on file   Social Determinants of Health   Financial Resource Strain:   . Difficulty of Paying Living Expenses:   Food Insecurity:   . Worried About Charity fundraiser in the Last Year:   . Arboriculturist in the Last Year:   Transportation Needs:   . Film/video editor (Medical):   Marland Kitchen Lack of Transportation (Non-Medical):   Physical Activity:   . Days of Exercise per Week:   .  Minutes of Exercise per Session:   Stress:   . Feeling of Stress :   Social Connections:   . Frequency of Communication with Friends and Family:   . Frequency of Social Gatherings with Friends and Family:   . Attends Religious Services:   . Active Member of Clubs or Organizations:   . Attends Archivist Meetings:   Marland Kitchen Marital Status:   Intimate Partner Violence:   . Fear of Current or Ex-Partner:   . Emotionally Abused:   Marland Kitchen Physically Abused:   . Sexually Abused:     Review of Systems: See HPI, otherwise negative ROS  Physical Exam: BP (!) 148/86   Pulse 94   Temp 97.6 F (36.4 C) (Temporal)   Resp 20   Ht 5' 5.5" (1.664 m)   Wt 96.2 kg   SpO2 100%   BMI 34.74 kg/m  General:   Alert,  pleasant and cooperative in NAD Head:  Normocephalic and atraumatic. Neck:  Supple; no masses or thyromegaly. Lungs:  Clear throughout to auscultation, normal respiratory effort.    Heart:  +S1, +S2, Regular rate and rhythm, No edema. Abdomen:  Soft, nontender and nondistended. Normal bowel sounds, without guarding, and without rebound.   Neurologic:  Alert and  oriented x4;  grossly normal neurologically.  Impression/Plan: Cheryl Flowers is here for a colonoscopy to be performed for positive FIT test.  Risks, benefits, limitations, and alternatives regarding  colonoscopy have been reviewed with the patient.  Questions have been answered.  All parties agreeable.   Virgel Manifold, MD  03/07/2020, 11:04 AM

## 2020-03-08 ENCOUNTER — Other Ambulatory Visit: Payer: Self-pay | Admitting: Gastroenterology

## 2020-03-08 ENCOUNTER — Encounter: Payer: Self-pay | Admitting: *Deleted

## 2020-03-08 DIAGNOSIS — K529 Noninfective gastroenteritis and colitis, unspecified: Secondary | ICD-10-CM

## 2020-03-08 LAB — SURGICAL PATHOLOGY

## 2020-03-08 MED ORDER — PREDNISONE 10 MG PO TABS
40.0000 mg | ORAL_TABLET | Freq: Every day | ORAL | 0 refills | Status: DC
Start: 2020-03-08 — End: 2020-03-15

## 2020-03-09 ENCOUNTER — Telehealth: Payer: Self-pay

## 2020-03-09 NOTE — Telephone Encounter (Signed)
-----   Message from Virgel Manifold, MD sent at 03/08/2020 12:21 PM EDT ----- Herb Grays please let the patient know, her biopsies show that she has Crohns disease. We need to get labs, and I have ordered these. Please ask her to get these done today or tomorrow and then start taking the prednisone I sent to pharmacy as soon as the labs are drawn. She doesn't need to wait for results to start taking the medicine. She should follow up with me in clinic next week so we can discuss her symptoms and tapering prednisone and other meds. Please schedule

## 2020-03-09 NOTE — Telephone Encounter (Signed)
Patient was contacted and informed on her colonoscopy results per Dr. Bonna Gains. Please read below. Patient agreed to come in on Monday 03/12/2020 to have her labs drawn here at the office. Patient was told about starting Prednisone and she stated that she has had a reaction to it before (sick on her stomach). I told her not to take the Prednisone and that I would ask Dr. Bonna Gains for an alternative medication if possible. Patient agreed and will wait for my call. Dr. Bonna Gains, is there anything else besides the Prednisone that the patient could take? Please advise.

## 2020-03-12 ENCOUNTER — Other Ambulatory Visit: Payer: Self-pay

## 2020-03-12 ENCOUNTER — Encounter: Payer: Self-pay | Admitting: Family Medicine

## 2020-03-12 ENCOUNTER — Other Ambulatory Visit: Payer: Medicaid Other

## 2020-03-12 DIAGNOSIS — K529 Noninfective gastroenteritis and colitis, unspecified: Secondary | ICD-10-CM | POA: Diagnosis not present

## 2020-03-12 DIAGNOSIS — K5090 Crohn's disease, unspecified, without complications: Secondary | ICD-10-CM | POA: Insufficient documentation

## 2020-03-12 NOTE — Telephone Encounter (Signed)
Per Dr. Bonna Gains: Start budesonide 29m daily. Give her a 30 day supply, but we will tell her how long to keep taking and how to taper it when we see her in clinic this week. Please tell her this is a milder steroid with predominant effects in the GI tract, and will be better if not causing her the effects that she had with prednisone previously. Please also order an MR enterography for Crohn's disease to evaluate her small bowel.   Called patient and had to leave her a message to call me back to go over some instructions.

## 2020-03-13 NOTE — Telephone Encounter (Signed)
Patient replied to the MyChart message that I had sent her. Please read.

## 2020-03-14 ENCOUNTER — Ambulatory Visit (HOSPITAL_COMMUNITY): Payer: Self-pay | Admitting: Psychiatry

## 2020-03-14 ENCOUNTER — Ambulatory Visit: Payer: Medicaid Other | Admitting: Gastroenterology

## 2020-03-15 ENCOUNTER — Other Ambulatory Visit: Payer: Self-pay

## 2020-03-15 ENCOUNTER — Ambulatory Visit (INDEPENDENT_AMBULATORY_CARE_PROVIDER_SITE_OTHER): Payer: Medicaid Other | Admitting: Gastroenterology

## 2020-03-15 ENCOUNTER — Encounter: Payer: Self-pay | Admitting: Gastroenterology

## 2020-03-15 VITALS — BP 115/79 | HR 102 | Temp 98.1°F | Wt 207.0 lb

## 2020-03-15 DIAGNOSIS — M479 Spondylosis, unspecified: Secondary | ICD-10-CM | POA: Insufficient documentation

## 2020-03-15 DIAGNOSIS — K50919 Crohn's disease, unspecified, with unspecified complications: Secondary | ICD-10-CM | POA: Diagnosis not present

## 2020-03-15 LAB — COMPREHENSIVE METABOLIC PANEL
ALT: 15 IU/L (ref 0–32)
AST: 19 IU/L (ref 0–40)
Albumin/Globulin Ratio: 1.4 (ref 1.2–2.2)
Albumin: 4.2 g/dL (ref 3.8–4.8)
Alkaline Phosphatase: 95 IU/L (ref 48–121)
BUN/Creatinine Ratio: 8 — ABNORMAL LOW (ref 9–23)
BUN: 6 mg/dL (ref 6–20)
Bilirubin Total: 0.2 mg/dL (ref 0.0–1.2)
CO2: 23 mmol/L (ref 20–29)
Calcium: 9.1 mg/dL (ref 8.7–10.2)
Chloride: 103 mmol/L (ref 96–106)
Creatinine, Ser: 0.72 mg/dL (ref 0.57–1.00)
GFR calc Af Amer: 123 mL/min/{1.73_m2} (ref >59)
GFR calc non Af Amer: 107 mL/min/{1.73_m2} (ref >59)
Globulin, Total: 2.9 g/dL (ref 1.5–4.5)
Glucose: 73 mg/dL (ref 65–99)
Potassium: 3.9 mmol/L (ref 3.5–5.2)
Sodium: 140 mmol/L (ref 134–144)
Total Protein: 7.1 g/dL (ref 6.0–8.5)

## 2020-03-15 LAB — HEPATITIS B CORE ANTIBODY, TOTAL: Hep B Core Total Ab: NEGATIVE

## 2020-03-15 LAB — CBC
Hematocrit: 35.1 % (ref 34.0–46.6)
Hemoglobin: 11.4 g/dL (ref 11.1–15.9)
MCH: 29.7 pg (ref 26.6–33.0)
MCHC: 32.5 g/dL (ref 31.5–35.7)
MCV: 91 fL (ref 79–97)
Platelets: 394 10*3/uL (ref 150–450)
RBC: 3.84 x10E6/uL (ref 3.77–5.28)
RDW: 12.7 % (ref 11.7–15.4)
WBC: 9.2 10*3/uL (ref 3.4–10.8)

## 2020-03-15 LAB — QUANTIFERON-TB GOLD PLUS
QuantiFERON Mitogen Value: >10 IU/mL
QuantiFERON Nil Value: 0 IU/mL
QuantiFERON TB1 Ag Value: 0 IU/mL
QuantiFERON TB2 Ag Value: 0 IU/mL
QuantiFERON-TB Gold Plus: NEGATIVE

## 2020-03-15 LAB — THIOPURINE METHYLTRANSFERASE (TPMT), RBC: TPMT Activity:: 18.4 Units/mL RBC

## 2020-03-15 LAB — HEPATITIS B SURFACE ANTIGEN: Hepatitis B Surface Ag: NEGATIVE

## 2020-03-15 LAB — HEPATITIS B SURFACE ANTIBODY,QUALITATIVE: Hep B Surface Ab, Qual: REACTIVE

## 2020-03-15 LAB — C-REACTIVE PROTEIN: CRP: 18 mg/L — ABNORMAL HIGH (ref 0–10)

## 2020-03-15 MED ORDER — ONDANSETRON HCL 4 MG PO TABS
4.0000 mg | ORAL_TABLET | Freq: Three times a day (TID) | ORAL | 0 refills | Status: AC | PRN
Start: 2020-03-15 — End: 2020-03-25

## 2020-03-15 MED ORDER — BUDESONIDE 3 MG PO CPEP
9.0000 mg | ORAL_CAPSULE | Freq: Every day | ORAL | 1 refills | Status: AC
Start: 2020-03-15 — End: 2020-04-14

## 2020-03-15 NOTE — Patient Instructions (Addendum)
YOUR MR Enter Abdominal and Pelvis is scheduled 04/02/2020 please arrived 1:30pm  For a 3:00pm scan  at the [facility]. Nothing to eat drink for 4 hours before the scan/    The Budesonide 37m is sent to the pharmacy

## 2020-03-15 NOTE — Progress Notes (Signed)
z

## 2020-03-16 NOTE — Progress Notes (Signed)
Cheryl Antigua, MD [address]  [place]  [place],  [address]  Main: [phone]  Fax: [phone]   Primary Care Physician: [name], DO   Chief Complaint  Patient presents with  . Follow-up  . Abdominal Pain    Patient stated that she continues to have RLQ pain.  . Nausea    Patient stated that she stays nauseated but denies vomiting.  . Diarrhea    Patient stated that she gets diarrhea in the AM.    HPI: Cheryl Flowers is a 39 y.o. female with bilateral lower quadrant abdominal pain, 3-4 loose bowel movements a day for 6 to 12 months here for follow-up.  Underwent colonoscopy with findings consistent with Crohn's disease.  CDAI score is 120.  Budesonide was prescribed but patient has not received this or start taking it.  No fever or chills.  Reports intermittent blood in stool.  Reports some wrist pain and pain in fingers joints.   Current Outpatient Medications  Medication Sig Dispense Refill  . albuterol (VENTOLIN HFA) 108 (90 Base) MCG/ACT inhaler Inhale 1 puff into the lungs every 6 (six) hours as needed for wheezing or shortness of breath. 18 g 3  . [START ON 03/17/2020] amphetamine-dextroamphetamine (ADDERALL XR) 15 MG 24 hr capsule Take 1 capsule by mouth every morning. 30 capsule 0  . DULoxetine (CYMBALTA) 60 MG capsule Take 1 capsule (60 mg total) by mouth daily. 90 capsule 1  . Fluticasone-Salmeterol (ADVAIR DISKUS) 250-50 MCG/DOSE AEPB Inhale 1 puff into the lungs 2 (two) times daily. 60 each 3  . methocarbamol (ROBAXIN) 500 MG tablet 1/2-1 po qBID prn    . metoprolol succinate (TOPROL-XL) 25 MG 24 hr tablet Take 1 tablet (25 mg total) by mouth daily. 90 tablet 0  . QUEtiapine (SEROQUEL XR) 50 MG TB24 24 hr tablet Take 1 tablet (50 mg total) by mouth at bedtime. 90 tablet 1  . SUMAtriptan (IMITREX) 100 MG tablet Take by mouth.    . traZODone (DESYREL) 100 MG tablet Take 100 mg by mouth at bedtime.    . budesonide (ENTOCORT EC)  3 MG 24 hr capsule Take 3 capsules (9 mg total) by mouth daily. 90 capsule 1  . ondansetron (ZOFRAN) 4 MG tablet Take 1 tablet (4 mg total) by mouth every 8 (eight) hours as needed for up to 10 days for nausea or vomiting. 30 tablet 0   No current facility-administered medications for this visit.    Allergies as of 03/15/2020 - Review Complete 03/15/2020  Allergen Reaction Noted  . Morphine and related Itching 12/15/2015  . Sulfa antibiotics Hives 12/15/2015    ROS:  General: Negative for anorexia, weight loss, fever, chills, fatigue, weakness. ENT: Negative for hoarseness, difficulty swallowing , nasal congestion. CV: Negative for chest pain, angina, palpitations, dyspnea on exertion, peripheral edema.  Respiratory: Negative for dyspnea at rest, dyspnea on exertion, cough, sputum, wheezing.  GI: See history of present illness. GU:  Negative for dysuria, hematuria, urinary incontinence, urinary frequency, nocturnal urination.  Endo: Negative for unusual weight change.    Physical Examination:   BP 115/79   Pulse (!) 102   Temp 98.1 F (36.7 C) (Oral)   Wt 207 lb (93.9 kg)   BMI 33.92 kg/m   General: Well-nourished, well-developed in no acute distress.  Eyes: No icterus. Conjunctivae pink. Mouth: Oropharyngeal mucosa moist and pink , no lesions erythema or exudate. Neck: Supple, Trachea midline Abdomen: Bowel sounds are normal, nontender, nondistended,  no hepatosplenomegaly or masses, no abdominal bruits or hernia , no rebound or guarding.   Extremities: No lower extremity edema. No clubbing or deformities. Neuro: Alert and oriented x 3.  Grossly intact. Skin: Warm and dry, no jaundice.   Psych: Alert and cooperative, normal mood and affect.   Labs: CMP     Component Value Date/Time   NA 140 03/12/2020 1349   K 3.9 03/12/2020 1349   CL 103 03/12/2020 1349   CO2 23 03/12/2020 1349   GLUCOSE 73 03/12/2020 1349   GLUCOSE 101 (H) 02/20/2018 0138   BUN 6 03/12/2020 1349    CREATININE 0.72 03/12/2020 1349   CALCIUM 9.1 03/12/2020 1349   PROT 7.1 03/12/2020 1349   ALBUMIN 4.2 03/12/2020 1349   AST 19 03/12/2020 1349   ALT 15 03/12/2020 1349   ALKPHOS 95 03/12/2020 1349   BILITOT 0.2 03/12/2020 1349   GFRNONAA 107 03/12/2020 1349   GFRAA 123 03/12/2020 1349   Lab Results  Component Value Date   WBC 9.2 03/12/2020   HGB 11.4 03/12/2020   HCT 35.1 03/12/2020   MCV 91 03/12/2020   PLT 394 03/12/2020    Imaging Studies: No results found.  Assessment and Plan:   Cheryl Flowers is a 39 y.o. y/o female here for follow-up of newly diagnosed Crohn's disease  Based on her CDAI score, and colonoscopy findings, patient is low risk.  Start budesonide.    Obtain MRE to evaluate how much of the small bowel is involved as terminal ileum could not be intubated due to inflammation in the terminal ileum.  There did not appear to be a stricture in the terminal ileum, but rather just inflammation, and therefore he was not intubated to avoid complications such as perforation.  Gold QuantiFERON is negative.  TPMT is normal.  Patient is hep B immune based on blood work.  Start maintenance therapy after MRE results  Dr Cheryl Flowers

## 2020-03-20 ENCOUNTER — Ambulatory Visit: Payer: Medicaid Other | Admitting: Gastroenterology

## 2020-04-02 ENCOUNTER — Ambulatory Visit
Admission: RE | Admit: 2020-04-02 | Discharge: 2020-04-02 | Disposition: A | Discharge status: Home / Self Care | Payer: Medicaid Other | Source: Ambulatory Visit | Attending: Gastroenterology | Admitting: Gastroenterology

## 2020-04-02 ENCOUNTER — Ambulatory Visit (HOSPITAL_COMMUNITY): Payer: Medicaid Other | Admitting: Psychiatry

## 2020-04-02 ENCOUNTER — Other Ambulatory Visit: Payer: Self-pay

## 2020-04-02 DIAGNOSIS — K5090 Crohn's disease, unspecified, without complications: Secondary | ICD-10-CM | POA: Diagnosis not present

## 2020-04-02 DIAGNOSIS — D259 Leiomyoma of uterus, unspecified: Secondary | ICD-10-CM | POA: Diagnosis not present

## 2020-04-02 DIAGNOSIS — K50919 Crohn's disease, unspecified, with unspecified complications: Secondary | ICD-10-CM | POA: Insufficient documentation

## 2020-04-02 MED ORDER — GADOBUTROL 1 MMOL/ML IV SOLN
9.0000 mL | Freq: Once | INTRAVENOUS | Status: AC | PRN
  Administered 2020-04-02: 9 mL via INTRAVENOUS

## 2020-04-05 ENCOUNTER — Other Ambulatory Visit: Payer: Self-pay

## 2020-04-05 ENCOUNTER — Encounter: Payer: Self-pay | Admitting: Family Medicine

## 2020-04-05 ENCOUNTER — Ambulatory Visit (INDEPENDENT_AMBULATORY_CARE_PROVIDER_SITE_OTHER): Payer: Medicaid Other | Admitting: Family Medicine

## 2020-04-05 VITALS — BP 133/89 | HR 93 | Temp 98.3°F | Ht 64.57 in | Wt 209.6 lb

## 2020-04-05 DIAGNOSIS — R509 Fever, unspecified: Secondary | ICD-10-CM | POA: Diagnosis not present

## 2020-04-05 DIAGNOSIS — H538 Other visual disturbances: Secondary | ICD-10-CM

## 2020-04-05 DIAGNOSIS — Z0000 Encounter for general adult medical examination without abnormal findings: Secondary | ICD-10-CM

## 2020-04-05 DIAGNOSIS — Z1159 Encounter for screening for other viral diseases: Secondary | ICD-10-CM | POA: Diagnosis not present

## 2020-04-05 DIAGNOSIS — K5010 Crohn's disease of large intestine without complications: Secondary | ICD-10-CM | POA: Diagnosis not present

## 2020-04-05 LAB — URINALYSIS, ROUTINE W REFLEX MICROSCOPIC
Bilirubin, UA: NEGATIVE
Glucose, UA: NEGATIVE
Ketones, UA: NEGATIVE
Leukocytes,UA: NEGATIVE
Nitrite, UA: NEGATIVE
Protein,UA: NEGATIVE
Specific Gravity, UA: 1.02 (ref 1.005–1.030)
Urobilinogen, Ur: 1 mg/dL (ref 0.2–1.0)
pH, UA: 7 (ref 5.0–7.5)

## 2020-04-05 LAB — MICROSCOPIC EXAMINATION
Bacteria, UA: NONE SEEN
RBC, Urine: NONE SEEN /hpf (ref 0–2)

## 2020-04-05 NOTE — Progress Notes (Signed)
BP 133/89 (BP Location: Left Arm, Patient Position: Sitting, Cuff Size: Normal)   Pulse 93   Temp 98.3 F (36.8 C) (Oral)   Ht 5' 4.57" (1.64 m)   Wt 209 lb 9.6 oz (95.1 kg)   LMP 04/04/2020 (Exact Date)   SpO2 100%   BMI 35.35 kg/m    Subjective:    Patient ID: Cheryl Flowers, female    DOB: 30-Apr-1981, 39 y.o.   MRN: [id number]  HPI: Cheryl Flowers is a 39 y.o. female presenting on 04/05/2020 for comprehensive medical examination. Current medical complaints include: Has not been feeling great since starting steroids. Recently diagnosed with Crohn's and fibroids. Doing OK with that. Following with her specialists. Not planning on taking the COVID vaccine. Otherwise feeling well.   Menopausal Symptoms: no  Depression Screen done today and results listed below:  Depression screen [facility] 2/9 04/05/2020 02/29/2020 01/17/2020 01/17/2020 10/07/2019  Decreased Interest 1 3 1 1 1   Down, Depressed, Hopeless 1 3 0 0 0  PHQ - 2 Score 2 6 1 1 1   Altered sleeping 2 2 2 2 3   Tired, decreased energy 2 2 2 2 2   Change in appetite 1 2 0 0 0  Feeling bad or failure about yourself  0 3 0 0 0  Trouble concentrating 2 2 3 3 1   Moving slowly or fidgety/restless 0 1 0 0 0  Suicidal thoughts 0 2 0 0 0  PHQ-9 Score 9 20 8 8 7   Difficult doing work/chores Very difficult Very difficult - Somewhat difficult Somewhat difficult  Some recent data might be hidden   GAD 7 : Generalized Anxiety Score 04/05/2020 02/29/2020 01/17/2020 01/17/2020  Nervous, Anxious, on Edge 2 2 2 2   Control/stop worrying 2 2 - 0  Worry too much - different things 2 2 1 1   Trouble relaxing 2 2 0 0  Restless 0 1 0 0  Easily annoyed or irritable 3 3 3 3   Afraid - awful might happen 2 2 2 2   Total GAD 7 Score 13 14 - 8  Anxiety Difficulty Very difficult Very difficult Somewhat difficult Somewhat difficult    Functional Status Survey: Is the patient deaf or have difficulty hearing?: No Does the patient have difficulty  seeing, even when wearing glasses/contacts?: Yes Does the patient have difficulty concentrating, remembering, or making decisions?: Yes Does the patient have difficulty walking or climbing stairs?: No Does the patient have difficulty dressing or bathing?: No Does the patient have difficulty doing errands alone such as visiting a doctor's office or shopping?: Yes   Past Medical History:  Past Medical History:  Diagnosis Date  . Anxiety   . Depression   . Low back pain   . Marital conflict   . Panic disorder   . Thyromegaly     Surgical History:  Past Surgical History:  Procedure Laterality Date  . CESAREAN SECTION  2003 and 2010  . CHOLECYSTECTOMY  2007  . COLONOSCOPY WITH PROPOFOL N/A 03/07/2020   Procedure: COLONOSCOPY WITH PROPOFOL;  Surgeon: Virgel Manifold, MD;  Location: [facility] ENDOSCOPY;  Service: Endoscopy;  Laterality: N/A;  . TUBAL LIGATION  2010    Medications:  Current Outpatient Medications on File Prior to Visit  Medication Sig  . albuterol (VENTOLIN HFA) 108 (90 Base) MCG/ACT inhaler Inhale 1 puff into the lungs every 6 (six) hours as needed for wheezing or shortness of breath.  . amphetamine-dextroamphetamine (ADDERALL XR) 15 MG 24 hr capsule Take 1 capsule  by mouth every morning.  . budesonide (ENTOCORT EC) 3 MG 24 hr capsule Take 3 capsules (9 mg total) by mouth daily.  . DULoxetine (CYMBALTA) 60 MG capsule Take 1 capsule (60 mg total) by mouth daily.  . Fluticasone-Salmeterol (ADVAIR DISKUS) 250-50 MCG/DOSE AEPB Inhale 1 puff into the lungs 2 (two) times daily.  . methocarbamol (ROBAXIN) 500 MG tablet 1/2-1 po qBID prn  . metoprolol succinate (TOPROL-XL) 25 MG 24 hr tablet Take 1 tablet (25 mg total) by mouth daily.  . QUEtiapine (SEROQUEL XR) 50 MG TB24 24 hr tablet Take 1 tablet (50 mg total) by mouth at bedtime.  . SUMAtriptan (IMITREX) 100 MG tablet Take by mouth. (Patient not taking: Reported on 04/05/2020)  . traZODone (DESYREL) 100 MG tablet Take 100  mg by mouth at bedtime. (Patient not taking: Reported on 04/05/2020)   No current facility-administered medications on file prior to visit.    Allergies:  Allergies  Allergen Reactions  . Morphine And Related Itching  . Sulfa Antibiotics Hives    Social History:  Social History   Socioeconomic History  . Marital status: Married    Spouse name: Not on file  . Number of children: Not on file  . Years of education: Not on file  . Highest education level: Not on file  Occupational History  . Not on file  Tobacco Use  . Smoking status: Current Every Day Smoker    Packs/day: 0.25    Years: 19.00    Pack years: 4.75    Types: Cigarettes    Last attempt to quit: 09/18/2019    Years since quitting: 0.5  . Smokeless tobacco: Never Used  Vaping Use  . Vaping Use: Former  Substance and Sexual Activity  . Alcohol use: Not Currently  . Drug use: No  . Sexual activity: Not on file  Other Topics Concern  . Not on file  Social History Narrative  . Not on file   Social Determinants of Health   Financial Resource Strain:   . Difficulty of Paying Living Expenses:   Food Insecurity:   . Worried About Charity fundraiser in the Last Year:   . Arboriculturist in the Last Year:   Transportation Needs:   . Film/video editor (Medical):   Marland Kitchen Lack of Transportation (Non-Medical):   Physical Activity:   . Days of Exercise per Week:   . Minutes of Exercise per Session:   Stress:   . Feeling of Stress :   Social Connections:   . Frequency of Communication with Friends and Family:   . Frequency of Social Gatherings with Friends and Family:   . Attends Religious Services:   . Active Member of Clubs or Organizations:   . Attends Archivist Meetings:   Marland Kitchen Marital Status:   Intimate Partner Violence:   . Fear of Current or Ex-Partner:   . Emotionally Abused:   Marland Kitchen Physically Abused:   . Sexually Abused:    Social History   Tobacco Use  Smoking Status Current Every Day  Smoker  . Packs/day: 0.25  . Years: 19.00  . Pack years: 4.75  . Types: Cigarettes  . Last attempt to quit: 09/18/2019  . Years since quitting: 0.5  Smokeless Tobacco Never Used   Social History   Substance and Sexual Activity  Alcohol Use Not Currently    Family History:  Family History  Problem Relation Age of Onset  . Hypertension Mother   . Rheum  arthritis Mother   . Diabetes Father   . Breast cancer Maternal Aunt        early 61  . Crohn's disease Maternal Aunt     Past medical history, surgical history, medications, allergies, family history and social history reviewed with patient today and changes made to appropriate areas of the chart.   Review of Systems  Constitutional: Positive for chills, diaphoresis and fever (99-101, for over a year with headaches). Negative for malaise/fatigue and weight loss.  HENT: Positive for congestion, hearing loss, sinus pain, sore throat and tinnitus. Negative for ear discharge, ear pain and nosebleeds.   Eyes: Positive for blurred vision. Negative for double vision, photophobia, pain, discharge and redness.  Respiratory: Positive for cough (at night) and shortness of breath (with a coughing fit at night). Negative for hemoptysis, sputum production, wheezing and stridor.   Cardiovascular: Positive for palpitations (with starting steroids) and leg swelling. Negative for chest pain, orthopnea, claudication and PND.       Some tightness with anxiety   Gastrointestinal: Positive for blood in stool (started last night), constipation, diarrhea and nausea. Negative for abdominal pain, heartburn, melena and vomiting.  Genitourinary: Negative.   Musculoskeletal: Positive for joint pain (R wrist has been acting up). Negative for back pain, falls, myalgias and neck pain.  Skin: Positive for rash (had a spot in 2017 in her R chest that has gotten bigger). Negative for itching.  Neurological: Positive for dizziness and tingling (hands and feet).  Negative for tremors, sensory change, speech change, focal weakness, seizures, loss of consciousness, weakness and headaches.  Endo/Heme/Allergies: Negative for environmental allergies and polydipsia. Bruises/bleeds easily.  Psychiatric/Behavioral: Positive for depression. Negative for hallucinations, memory loss, substance abuse and suicidal ideas. The patient is nervous/anxious. The patient does not have insomnia.     All other ROS negative except what is listed above and in the HPI.      Objective:    BP 133/89 (BP Location: Left Arm, Patient Position: Sitting, Cuff Size: Normal)   Pulse 93   Temp 98.3 F (36.8 C) (Oral)   Ht 5' 4.57" (1.64 m)   Wt 209 lb 9.6 oz (95.1 kg)   LMP 04/04/2020 (Exact Date)   SpO2 100%   BMI 35.35 kg/m   Wt Readings from Last 3 Encounters:  04/05/20 209 lb 9.6 oz (95.1 kg)  03/15/20 207 lb (93.9 kg)  03/07/20 212 lb (96.2 kg)    Physical Exam Vitals and nursing note reviewed. Exam conducted with a chaperone present.  Constitutional:      General: She is not in acute distress.    Appearance: Normal appearance. She is not ill-appearing, toxic-appearing or diaphoretic.  HENT:     Head: Normocephalic and atraumatic.     Right Ear: Tympanic membrane, ear canal and external ear normal. There is no impacted cerumen.     Left Ear: Tympanic membrane, ear canal and external ear normal. There is no impacted cerumen.     Nose: Nose normal. No congestion or rhinorrhea.     Mouth/Throat:     Mouth: Mucous membranes are moist.     Pharynx: Oropharynx is clear. No oropharyngeal exudate or posterior oropharyngeal erythema.  Eyes:     General: No scleral icterus.       Right eye: No discharge.        Left eye: No discharge.     Extraocular Movements: Extraocular movements intact.     Conjunctiva/sclera: Conjunctivae normal.     Pupils: Pupils  are equal, round, and reactive to light.  Neck:     Vascular: No carotid bruit.  Cardiovascular:     Rate and  Rhythm: Normal rate and regular rhythm.     Pulses: Normal pulses.     Heart sounds: No murmur heard.  No friction rub. No gallop.   Pulmonary:     Effort: Pulmonary effort is normal. No respiratory distress.     Breath sounds: Normal breath sounds. No stridor. No wheezing, rhonchi or rales.  Chest:     Chest wall: No tenderness.     Breasts:        Right: Normal.        Left: Normal.  Abdominal:     General: Abdomen is flat. Bowel sounds are normal. There is no distension.     Palpations: Abdomen is soft. There is no mass.     Tenderness: There is no abdominal tenderness. There is no right CVA tenderness, left CVA tenderness, guarding or rebound.     Hernia: No hernia is present.  Genitourinary:    Comments: Breast and pelvic exams deferred with shared decision making Musculoskeletal:        General: No swelling, tenderness, deformity or signs of injury.     Cervical back: Normal range of motion and neck supple. No rigidity. No muscular tenderness.     Right lower leg: No edema.     Left lower leg: No edema.  Lymphadenopathy:     Cervical: No cervical adenopathy.  Skin:    General: Skin is warm and dry.     Capillary Refill: Capillary refill takes less than 2 seconds.     Coloration: Skin is not jaundiced or pale.     Findings: No bruising, erythema, lesion or rash.     Comments: Inclusion cyst on R chest  Neurological:     General: No focal deficit present.     Mental Status: She is alert and oriented to person, place, and time. Mental status is at baseline.     Cranial Nerves: No cranial nerve deficit.     Sensory: No sensory deficit.     Motor: No weakness.     Coordination: Coordination normal.     Gait: Gait normal.     Deep Tendon Reflexes: Reflexes normal.  Psychiatric:        Mood and Affect: Mood normal.        Behavior: Behavior normal.        Thought Content: Thought content normal.        Judgment: Judgment normal.     Results for orders placed or  performed in visit on 03/12/20  Comprehensive Metabolic Panel (CMET)  Result Value Ref Range   Glucose 73 65 - 99 mg/dL   BUN 6 6 - 20 mg/dL   Creatinine, Ser 0.72 0.57 - 1.00 mg/dL   GFR calc non Af Amer 107 >59 mL/min/1.73   GFR calc Af Amer 123 >59 mL/min/1.73   BUN/Creatinine Ratio 8 (L) 9 - 23   Sodium 140 134 - 144 mmol/L   Potassium 3.9 3.5 - 5.2 mmol/L   Chloride 103 96 - 106 mmol/L   CO2 23 20 - 29 mmol/L   Calcium 9.1 8.7 - 10.2 mg/dL   Total Protein 7.1 6.0 - 8.5 g/dL   Albumin 4.2 3.8 - 4.8 g/dL   Globulin, Total 2.9 1.5 - 4.5 g/dL   Albumin/Globulin Ratio 1.4 1.2 - 2.2   Bilirubin Total 0.2 0.0 - 1.2 mg/dL  Alkaline Phosphatase 95 48 - 121 IU/L   AST 19 0 - 40 IU/L   ALT 15 0 - 32 IU/L  CBC  Result Value Ref Range   WBC 9.2 3.4 - 10.8 x10E3/uL   RBC 3.84 3.77 - 5.28 x10E6/uL   Hemoglobin 11.4 11.1 - 15.9 g/dL   Hematocrit 35.1 34.0 - 46.6 %   MCV 91 79 - 97 fL   MCH 29.7 26.6 - 33.0 pg   MCHC 32.5 31 - 35 g/dL   RDW 12.7 11.7 - 15.4 %   Platelets 394 150 - 450 x10E3/uL  Thiopurine methyltransferase(tpmt)rbc  Result Value Ref Range   TPMT Activity: 18.4 Units/mL RBC   TPMT Interpretation: Comment    TPMT Methodology: Comment   QuantiFERON-TB Gold Plus  Result Value Ref Range   QuantiFERON Incubation Incubation performed.    QuantiFERON Criteria Comment    QuantiFERON TB1 Ag Value 0.00 IU/mL   QuantiFERON TB2 Ag Value 0.00 IU/mL   QuantiFERON Nil Value 0.00 IU/mL   QuantiFERON Mitogen Value >10.00 IU/mL   QuantiFERON-TB Gold Plus Negative Negative  Hepatitis B surface antibody,qualitative  Result Value Ref Range   Hep B Surface Ab, Qual Reactive   Hepatitis B surface antigen  Result Value Ref Range   Hepatitis B Surface Ag Negative Negative  C-reactive protein  Result Value Ref Range   CRP 18 (H) 0 - 10 mg/L  Hepatitis B core antibody, total  Result Value Ref Range   Hep B Core Total Ab Negative Negative      Assessment & Plan:   Problem List  Items Addressed This Visit      Digestive   Crohn's disease of colon without complication ([place])    Other Visit Diagnoses    Routine general medical examination at a health care facility    -  Primary   Vaccines up to date/declined. Screening labs checked today. Pap up to date. Continue diet and exercise. Call with any concerns.    Relevant Orders   CBC with Differential/Platelet   Comprehensive metabolic panel   Lipid Panel w/o Chol/HDL Ratio   TSH   Urinalysis, Routine w reflex microscopic   Need for hepatitis C screening test       Labs drawn today. Await results.    Relevant Orders   Hepatitis C Antibody   Chronic fever       Will get her into ID. Has been going on for over a year. Checking labs. Await results.    Relevant Orders   Ambulatory referral to Infectious Disease   Blurred vision       Referral to opthalmology made today. Await results.    Relevant Orders   Ambulatory referral to Ophthalmology       Follow up plan: Return ASAP for mole removal, ASAP to discuss other issues.   LABORATORY TESTING:  - Pap smear: up to date  IMMUNIZATIONS:   - Tdap: Tetanus vaccination status reviewed: last tetanus booster within 10 years. - Influenza: Postponed to flu season - Pneumovax: Up to date  SCREENING: -Mammogram: Not applicable  - Colonoscopy: Up to date   PATIENT COUNSELING:   Advised to take 1 mg of folate supplement per day if capable of pregnancy.   Sexuality: Discussed sexually transmitted diseases, partner selection, use of condoms, avoidance of unintended pregnancy  and contraceptive alternatives.   Advised to avoid cigarette smoking.  I discussed with the patient that most people either abstain from alcohol or drink within safe  limits (<=14/week and <=4 drinks/occasion for males, <=7/weeks and <= 3 drinks/occasion for females) and that the risk for alcohol disorders and other health effects rises proportionally with the number of drinks per week and how  often a drinker exceeds daily limits.  Discussed cessation/primary prevention of drug use and availability of treatment for abuse.   Diet: Encouraged to adjust caloric intake to maintain  or achieve ideal body weight, to reduce intake of dietary saturated fat and total fat, to limit sodium intake by avoiding high sodium foods and not adding table salt, and to maintain adequate dietary potassium and calcium preferably from fresh fruits, vegetables, and low-fat dairy products.    stressed the importance of regular exercise  Injury prevention: Discussed safety belts, safety helmets, smoke detector, smoking near bedding or upholstery.   Dental health: Discussed importance of regular tooth brushing, flossing, and dental visits.    NEXT PREVENTATIVE PHYSICAL DUE IN 1 YEAR. Return ASAP for mole removal, ASAP to discuss other issues.

## 2020-04-06 LAB — CBC WITH DIFFERENTIAL/PLATELET
Basophils Absolute: 0.1 10*3/uL (ref 0.0–0.2)
Basos: 1 %
EOS (ABSOLUTE): 0.1 10*3/uL (ref 0.0–0.4)
Eos: 1 %
Hematocrit: 36.4 % (ref 34.0–46.6)
Hemoglobin: 11.9 g/dL (ref 11.1–15.9)
Immature Grans (Abs): 0 10*3/uL (ref 0.0–0.1)
Immature Granulocytes: 0 %
Lymphocytes Absolute: 2.2 10*3/uL (ref 0.7–3.1)
Lymphs: 30 %
MCH: 30.1 pg (ref 26.6–33.0)
MCHC: 32.7 g/dL (ref 31.5–35.7)
MCV: 92 fL (ref 79–97)
Monocytes Absolute: 0.5 10*3/uL (ref 0.1–0.9)
Monocytes: 7 %
Neutrophils Absolute: 4.6 10*3/uL (ref 1.4–7.0)
Neutrophils: 61 %
Platelets: 294 10*3/uL (ref 150–450)
RBC: 3.95 x10E6/uL (ref 3.77–5.28)
RDW: 12.8 % (ref 11.7–15.4)
WBC: 7.4 10*3/uL (ref 3.4–10.8)

## 2020-04-06 LAB — LIPID PANEL W/O CHOL/HDL RATIO
Cholesterol, Total: 154 mg/dL (ref 100–199)
HDL: 45 mg/dL (ref >39)
LDL Chol Calc (NIH): 93 mg/dL (ref 0–99)
Triglycerides: 86 mg/dL (ref 0–149)
VLDL Cholesterol Cal: 16 mg/dL (ref 5–40)

## 2020-04-06 LAB — COMPREHENSIVE METABOLIC PANEL
ALT: 14 IU/L (ref 0–32)
AST: 15 IU/L (ref 0–40)
Albumin/Globulin Ratio: 1.4 (ref 1.2–2.2)
Albumin: 4 g/dL (ref 3.8–4.8)
Alkaline Phosphatase: 79 IU/L (ref 48–121)
BUN/Creatinine Ratio: 11 (ref 9–23)
BUN: 8 mg/dL (ref 6–20)
Bilirubin Total: <0.2 mg/dL (ref 0.0–1.2)
CO2: 22 mmol/L (ref 20–29)
Calcium: 9 mg/dL (ref 8.7–10.2)
Chloride: 109 mmol/L — ABNORMAL HIGH (ref 96–106)
Creatinine, Ser: 0.75 mg/dL (ref 0.57–1.00)
GFR calc Af Amer: 117 mL/min/{1.73_m2} (ref >59)
GFR calc non Af Amer: 101 mL/min/{1.73_m2} (ref >59)
Globulin, Total: 2.9 g/dL (ref 1.5–4.5)
Glucose: 79 mg/dL (ref 65–99)
Potassium: 3.9 mmol/L (ref 3.5–5.2)
Sodium: 143 mmol/L (ref 134–144)
Total Protein: 6.9 g/dL (ref 6.0–8.5)

## 2020-04-06 LAB — TSH: TSH: 0.574 u[IU]/mL (ref 0.450–4.500)

## 2020-04-06 LAB — HEPATITIS C ANTIBODY: Hep C Virus Ab: <0.1 s/co ratio (ref 0.0–0.9)

## 2020-04-09 ENCOUNTER — Encounter: Payer: Self-pay | Admitting: Family Medicine

## 2020-04-11 ENCOUNTER — Ambulatory Visit: Payer: Medicaid Other | Admitting: Family Medicine

## 2020-04-12 ENCOUNTER — Ambulatory Visit: Payer: Medicaid Other | Admitting: Gastroenterology

## 2020-04-17 ENCOUNTER — Ambulatory Visit: Payer: Medicaid Other | Admitting: Family Medicine

## 2020-04-18 ENCOUNTER — Ambulatory Visit: Payer: Medicaid Other | Admitting: Family Medicine

## 2020-04-19 ENCOUNTER — Ambulatory Visit: Payer: Medicaid Other | Admitting: Infectious Diseases

## 2020-04-26 ENCOUNTER — Ambulatory Visit: Payer: Medicaid Other | Admitting: Family Medicine

## 2020-05-01 DIAGNOSIS — E531 Pyridoxine deficiency: Secondary | ICD-10-CM | POA: Diagnosis not present

## 2020-05-01 DIAGNOSIS — E519 Thiamine deficiency, unspecified: Secondary | ICD-10-CM | POA: Diagnosis not present

## 2020-05-01 DIAGNOSIS — G932 Benign intracranial hypertension: Secondary | ICD-10-CM | POA: Diagnosis not present

## 2020-05-01 DIAGNOSIS — E559 Vitamin D deficiency, unspecified: Secondary | ICD-10-CM | POA: Diagnosis not present

## 2020-05-01 DIAGNOSIS — G43019 Migraine without aura, intractable, without status migrainosus: Secondary | ICD-10-CM | POA: Diagnosis not present

## 2020-05-01 DIAGNOSIS — G5602 Carpal tunnel syndrome, left upper limb: Secondary | ICD-10-CM | POA: Diagnosis not present

## 2020-05-01 DIAGNOSIS — M5481 Occipital neuralgia: Secondary | ICD-10-CM | POA: Diagnosis not present

## 2020-05-01 DIAGNOSIS — E538 Deficiency of other specified B group vitamins: Secondary | ICD-10-CM | POA: Diagnosis not present

## 2020-05-01 DIAGNOSIS — R202 Paresthesia of skin: Secondary | ICD-10-CM | POA: Diagnosis not present

## 2020-05-01 DIAGNOSIS — Z131 Encounter for screening for diabetes mellitus: Secondary | ICD-10-CM | POA: Diagnosis not present

## 2020-05-02 ENCOUNTER — Ambulatory Visit: Payer: Medicaid Other | Admitting: Family Medicine

## 2020-05-10 ENCOUNTER — Ambulatory Visit: Payer: Medicaid Other | Admitting: Infectious Diseases

## 2020-05-14 DIAGNOSIS — Z124 Encounter for screening for malignant neoplasm of cervix: Secondary | ICD-10-CM | POA: Diagnosis not present

## 2020-05-14 DIAGNOSIS — N920 Excessive and frequent menstruation with regular cycle: Secondary | ICD-10-CM | POA: Diagnosis not present

## 2020-05-14 DIAGNOSIS — N946 Dysmenorrhea, unspecified: Secondary | ICD-10-CM | POA: Diagnosis not present

## 2020-05-14 LAB — HM PAP SMEAR

## 2020-05-28 ENCOUNTER — Encounter: Payer: Self-pay | Admitting: Family Medicine

## 2020-05-29 ENCOUNTER — Other Ambulatory Visit: Payer: Self-pay | Admitting: Family Medicine

## 2020-05-29 NOTE — Telephone Encounter (Signed)
Routing to provider  

## 2020-05-30 ENCOUNTER — Other Ambulatory Visit: Payer: Self-pay | Admitting: Family Medicine

## 2020-05-30 NOTE — Telephone Encounter (Signed)
Patient last seen 04/05/20

## 2020-05-31 ENCOUNTER — Telehealth: Payer: Self-pay | Admitting: Family Medicine

## 2020-05-31 ENCOUNTER — Encounter: Payer: Self-pay | Admitting: Family Medicine

## 2020-05-31 NOTE — Telephone Encounter (Signed)
Patient is requesting RF already pended for provider review.

## 2020-05-31 NOTE — Telephone Encounter (Signed)
Apologies- forwarded to wrong office

## 2020-05-31 NOTE — Telephone Encounter (Signed)
Medication Refill - Medication: Adderall  Has the patient contacted their pharmacy? Yes.   (Agent: If no, request that the patient contact the pharmacy for the refill.) (Agent: If yes, when and what did the pharmacy advise?)  Preferred Pharmacy (with phone number or street name): [facility] [address]  Agent: Please be advised that RX refills may take up to 3 business days. We ask that you follow-up with your pharmacy.

## 2020-06-01 ENCOUNTER — Other Ambulatory Visit: Payer: Self-pay | Admitting: Family Medicine

## 2020-06-01 NOTE — Telephone Encounter (Signed)
Patient needs to schedule office visit for September, currently no office visit scheduled and needs a 3 month follow-up scheduled for this medication.  Thanks

## 2020-06-01 NOTE — Telephone Encounter (Signed)
Routing to provider  

## 2020-06-04 ENCOUNTER — Other Ambulatory Visit: Payer: Self-pay | Admitting: Family Medicine

## 2020-06-04 DIAGNOSIS — D259 Leiomyoma of uterus, unspecified: Secondary | ICD-10-CM | POA: Diagnosis not present

## 2020-06-04 DIAGNOSIS — D251 Intramural leiomyoma of uterus: Secondary | ICD-10-CM | POA: Diagnosis not present

## 2020-06-04 DIAGNOSIS — N946 Dysmenorrhea, unspecified: Secondary | ICD-10-CM | POA: Diagnosis not present

## 2020-06-04 MED ORDER — AMPHETAMINE-DEXTROAMPHET ER 15 MG PO CP24: 15.0000 mg | ORAL_CAPSULE | ORAL | 0 refills | Status: DC

## 2020-06-04 NOTE — Telephone Encounter (Signed)
Appointment scheduled 07/03/2020

## 2020-06-07 ENCOUNTER — Ambulatory Visit (INDEPENDENT_AMBULATORY_CARE_PROVIDER_SITE_OTHER): Payer: Medicaid Other | Admitting: Gastroenterology

## 2020-06-07 ENCOUNTER — Encounter: Payer: Self-pay | Admitting: Gastroenterology

## 2020-06-07 ENCOUNTER — Other Ambulatory Visit: Payer: Self-pay

## 2020-06-07 VITALS — BP 126/84 | HR 108 | Temp 98.3°F | Ht 64.0 in | Wt 210.0 lb

## 2020-06-07 DIAGNOSIS — K5080 Crohn's disease of both small and large intestine without complications: Secondary | ICD-10-CM

## 2020-06-07 MED ORDER — BUDESONIDE 3 MG PO CPEP: 9.0000 mg | ORAL_CAPSULE | Freq: Every day | ORAL | 0 refills | Status: DC

## 2020-06-07 NOTE — Progress Notes (Signed)
Cheryl Antigua, MD [address]  [place]  [place], [place] [address]  Main: [phone]  Fax: [phone]   Primary Care Physician: [name], DO   Chief Complaint  Patient presents with  . Crohn's Disease    HPI: Cheryl Flowers is a 39 y.o. female here for follow-up of Crohn's disease.  Patient was started on budesonide after her colonoscopy and diagnosis.  The plan was for a budesonide for 8 to 12 weeks with taper, and then start immunomodulator.  However, patient canceled her previous appointment with [place] and did not reschedule and is coming in now.  She only took budesonide for about a month and during this time noted improvement in abdominal pain, diarrhea and blood in stool.  Since being off the medication, she has recently had return of abdominal pain, is having 3 loose bowel movements a day, intermittent blood in stool, last noted a week ago.  No fever chills or nausea or vomiting.    Current Outpatient Medications  Medication Sig Dispense Refill  . albuterol (VENTOLIN HFA) 108 (90 Base) MCG/ACT inhaler Inhale 1 puff into the lungs every 6 (six) hours as needed for wheezing or shortness of breath. 18 g 3  . amphetamine-dextroamphetamine (ADDERALL XR) 15 MG 24 hr capsule Take 1 capsule by mouth every morning. 30 capsule 0  . budesonide (ENTOCORT EC) 3 MG 24 hr capsule Take 3 capsules (9 mg total) by mouth daily. 180 capsule 0  . DULoxetine (CYMBALTA) 60 MG capsule Take 1 capsule (60 mg total) by mouth daily. 90 capsule 1  . Fluticasone-Salmeterol (ADVAIR DISKUS) 250-50 MCG/DOSE AEPB Inhale 1 puff into the lungs 2 (two) times daily. 60 each 3  . norethindrone (AYGESTIN) 5 MG tablet Take 1 tablet by mouth daily.    . methocarbamol (ROBAXIN) 500 MG tablet 1/2-1 po qBID prn (Patient not taking: Reported on 06/07/2020)    . metoprolol succinate (TOPROL-XL) 25 MG 24 hr tablet Take 1 tablet (25 mg total) by mouth daily. (Patient not taking: Reported on  06/07/2020) 90 tablet 0  . QUEtiapine (SEROQUEL XR) 50 MG TB24 24 hr tablet Take 1 tablet (50 mg total) by mouth at bedtime. (Patient not taking: Reported on 06/07/2020) 90 tablet 1  . SUMAtriptan (IMITREX) 100 MG tablet Take by mouth.  (Patient not taking: Reported on 06/07/2020)     No current facility-administered medications for this visit.    Allergies as of 06/07/2020 - Review Complete 06/07/2020  Allergen Reaction Noted  . Morphine and related Itching 12/15/2015  . Sulfa antibiotics Hives 12/15/2015    ROS:  General: Negative for anorexia, weight loss, fever, chills, fatigue, weakness. ENT: Negative for hoarseness, difficulty swallowing , nasal congestion. CV: Negative for chest pain, angina, palpitations, dyspnea on exertion, peripheral edema.  Respiratory: Negative for dyspnea at rest, dyspnea on exertion, cough, sputum, wheezing.  GI: See history of present illness. GU:  Negative for dysuria, hematuria, urinary incontinence, urinary frequency, nocturnal urination.  Endo: Negative for unusual weight change.    Physical Examination:   BP 126/84   Pulse (!) 108   Temp 98.3 F (36.8 C) (Oral)   Ht 5' 4"  (1.626 m)   Wt 210 lb (95.3 kg)   BMI 36.05 kg/m   General: Well-nourished, well-developed in no acute distress.  Eyes: No icterus. Conjunctivae pink. Mouth: Oropharyngeal mucosa moist and pink , no lesions erythema or exudate. Neck: Supple, Trachea midline Abdomen: Bowel sounds are normal, nontender, nondistended, no hepatosplenomegaly or  masses, no abdominal bruits or hernia , no rebound or guarding.   Extremities: No lower extremity edema. No clubbing or deformities. Neuro: Alert and oriented x 3.  Grossly intact. Skin: Warm and dry, no jaundice.   Psych: Alert and cooperative, normal mood and affect.   Labs: CMP     Component Value Date/Time   NA 143 04/05/2020 0927   K 3.9 04/05/2020 0927   CL 109 (H) 04/05/2020 0927   CO2 22 04/05/2020 0927   GLUCOSE 79  04/05/2020 0927   GLUCOSE 101 (H) 02/20/2018 0138   BUN 8 04/05/2020 0927   CREATININE 0.75 04/05/2020 0927   CALCIUM 9.0 04/05/2020 0927   PROT 6.9 04/05/2020 0927   ALBUMIN 4.0 04/05/2020 0927   AST 15 04/05/2020 0927   ALT 14 04/05/2020 0927   ALKPHOS 79 04/05/2020 0927   BILITOT <0.2 04/05/2020 0927   GFRNONAA 101 04/05/2020 0927   GFRAA 117 04/05/2020 0927   Lab Results  Component Value Date   WBC 7.4 04/05/2020   HGB 11.9 04/05/2020   HCT 36.4 04/05/2020   MCV 92 04/05/2020   PLT 294 04/05/2020    Imaging Studies: No results found.  Assessment and Plan:   Cheryl Flowers is a 39 y.o. y/o female here for follow-up of Crohn's disease  We extensively discussed need for compliance and close follow-up with her.  I discussed that if she does not follow-up appropriately and takes appropriate medications, her disease can worsen and she can end up developing strictures and needs surgery.  She understands this.  Since she only took the budesonide for 1 month, we will need to restart therapy as she is having similar symptoms again that were present before the start of the budesonide.  Restart budesonide at 9 mg a day which we will continue for for 8 weeks depending on symptoms.  Follow-up with me in exactly 4 weeks and reassess symptoms.  Start maintenance therapy after clinical remission but budesonide in 8 to 12 weeks    Dr Cheryl Flowers

## 2020-07-03 ENCOUNTER — Other Ambulatory Visit: Payer: Self-pay

## 2020-07-03 ENCOUNTER — Ambulatory Visit (INDEPENDENT_AMBULATORY_CARE_PROVIDER_SITE_OTHER): Payer: Medicaid Other | Admitting: Family Medicine

## 2020-07-03 ENCOUNTER — Encounter: Payer: Self-pay | Admitting: Family Medicine

## 2020-07-03 VITALS — BP 135/83 | HR 97 | Temp 98.1°F | Wt 205.2 lb

## 2020-07-03 DIAGNOSIS — H538 Other visual disturbances: Secondary | ICD-10-CM

## 2020-07-03 DIAGNOSIS — F988 Other specified behavioral and emotional disorders with onset usually occurring in childhood and adolescence: Secondary | ICD-10-CM | POA: Diagnosis not present

## 2020-07-03 DIAGNOSIS — R1031 Right lower quadrant pain: Secondary | ICD-10-CM | POA: Diagnosis not present

## 2020-07-03 DIAGNOSIS — Z23 Encounter for immunization: Secondary | ICD-10-CM

## 2020-07-03 LAB — UA/M W/RFLX CULTURE, ROUTINE
Bilirubin, UA: NEGATIVE
Glucose, UA: NEGATIVE
Ketones, UA: NEGATIVE
Leukocytes,UA: NEGATIVE
Nitrite, UA: NEGATIVE
RBC, UA: NEGATIVE
Specific Gravity, UA: 1.015 (ref 1.005–1.030)
Urobilinogen, Ur: 1 mg/dL (ref 0.2–1.0)
pH, UA: 7 (ref 5.0–7.5)

## 2020-07-03 LAB — MICROSCOPIC EXAMINATION
Bacteria, UA: NONE SEEN
WBC, UA: NONE SEEN /hpf (ref 0–5)

## 2020-07-03 MED ORDER — METOPROLOL SUCCINATE ER 25 MG PO TB24
25.0000 mg | ORAL_TABLET | Freq: Every day | ORAL | 0 refills | Status: DC
Start: 2020-07-03 — End: 2021-04-22

## 2020-07-03 MED ORDER — DULOXETINE HCL 60 MG PO CPEP
60.0000 mg | ORAL_CAPSULE | Freq: Every day | ORAL | 1 refills | Status: DC
Start: 2020-07-03 — End: 2020-11-05

## 2020-07-03 MED ORDER — AMPHETAMINE-DEXTROAMPHET ER 15 MG PO CP24: 15.0000 mg | ORAL_CAPSULE | ORAL | 0 refills | Status: DC

## 2020-07-03 MED ORDER — ALBUTEROL SULFATE HFA 108 (90 BASE) MCG/ACT IN AERS: 1.0000 | INHALATION_SPRAY | Freq: Four times a day (QID) | RESPIRATORY_TRACT | 3 refills | Status: DC | PRN

## 2020-07-03 MED ORDER — METHOCARBAMOL 500 MG PO TABS: 250.0000 mg | ORAL_TABLET | Freq: Three times a day (TID) | ORAL | 1 refills | Status: DC | PRN

## 2020-07-03 NOTE — Progress Notes (Signed)
BP 135/83 (BP Location: Left Arm, Patient Position: Sitting, Cuff Size: Normal)   Pulse 97   Temp 98.1 F (36.7 C) (Oral)   Wt 205 lb 3.2 oz (93.1 kg)   SpO2 99%   BMI 35.22 kg/m    Subjective:    Patient ID: Cheryl Flowers, female    DOB: Dec 06, 1980, 39 y.o.   MRN: [id number]  HPI: Cheryl Flowers is a 39 y.o. female  Chief Complaint  Patient presents with  . ADD  . Bloated  . Constipation  . tinderness    right lower side  . Pressure Behind the Eyes    dizziness related - has appt with Ophthalmologists   ADHD FOLLOW UP ADHD status: controlled Satisfied with current therapy: yes Medication compliance:  excellent compliance Controlled substance contract: no Previous psychiatry evaluation: no Previous medications: yes adderall   Taking meds on weekends/vacations: yes Work/school performance:  excellent Difficulty sustaining attention/completing tasks: no Distracted by extraneous stimuli: no Does not listen when spoken to: no  Fidgets with hands or feet: no Unable to stay in seat: no Blurts out/interrupts others: no ADHD Medication Side Effects: no    Decreased appetite: no    Headache: yes    Sleeping disturbance pattern: no    Irritability: no    Rebound effects (worse than baseline) off medication: no    Anxiousness: yes    Dizziness: yes    Tics: no   ABDOMINAL PAIN  Duration: about a week  Onset: sudden Severity: moderate Quality: dull Location:  RLQ  Episode duration: couple of minutes  Radiation: no Frequency: intermittent Alleviating factors: raising her R leg Status: stable Treatments attempted: flaxseed Fever: no Nausea: yes Vomiting: no Weight loss: no Decreased appetite: no Diarrhea: no Constipation: yes Blood in stool: no Heartburn: yes Jaundice: no Rash: no Dysuria/urinary frequency: no Hematuria: no History of sexually transmitted disease: no Recurrent NSAID use: no   Relevant past medical, surgical,  family and social history reviewed and updated as indicated. Interim medical history since our last visit reviewed. Allergies and medications reviewed and updated.  Review of Systems  Constitutional: Negative.   Eyes:       Blurred vision, pressure behind her eyes, headache   Respiratory: Negative.   Cardiovascular: Negative.   Gastrointestinal: Positive for abdominal pain and constipation. Negative for abdominal distention, anal bleeding, blood in stool, diarrhea, nausea, rectal pain and vomiting.  Genitourinary: Negative.   Musculoskeletal: Positive for back pain and myalgias. Negative for arthralgias, gait problem, joint swelling, neck pain and neck stiffness.  Skin: Negative.   Neurological: Negative.   Psychiatric/Behavioral: Negative.     Per HPI unless specifically indicated above     Objective:    BP 135/83 (BP Location: Left Arm, Patient Position: Sitting, Cuff Size: Normal)   Pulse 97   Temp 98.1 F (36.7 C) (Oral)   Wt 205 lb 3.2 oz (93.1 kg)   SpO2 99%   BMI 35.22 kg/m   Wt Readings from Last 3 Encounters:  07/03/20 205 lb 3.2 oz (93.1 kg)  06/07/20 210 lb (95.3 kg)  04/05/20 209 lb 9.6 oz (95.1 kg)    Physical Exam Vitals and nursing note reviewed.  Constitutional:      General: She is not in acute distress.    Appearance: Normal appearance. She is not ill-appearing, toxic-appearing or diaphoretic.  HENT:     Head: Normocephalic and atraumatic.     Right Ear: External ear normal.     Left  Ear: External ear normal.     Nose: Nose normal.     Mouth/Throat:     Mouth: Mucous membranes are moist.     Pharynx: Oropharynx is clear.  Eyes:     General: No scleral icterus.       Right eye: No discharge.        Left eye: No discharge.     Extraocular Movements: Extraocular movements intact.     Conjunctiva/sclera: Conjunctivae normal.     Pupils: Pupils are equal, round, and reactive to light.  Cardiovascular:     Rate and Rhythm: Normal rate and regular  rhythm.     Pulses: Normal pulses.     Heart sounds: Normal heart sounds. No murmur heard.  No friction rub. No gallop.   Pulmonary:     Effort: Pulmonary effort is normal. No respiratory distress.     Breath sounds: Normal breath sounds. No stridor. No wheezing, rhonchi or rales.  Chest:     Chest wall: No tenderness.  Musculoskeletal:        General: Normal range of motion.     Cervical back: Normal range of motion and neck supple.  Skin:    General: Skin is warm and dry.     Capillary Refill: Capillary refill takes less than 2 seconds.     Coloration: Skin is not jaundiced or pale.     Findings: No bruising, erythema, lesion or rash.  Neurological:     General: No focal deficit present.     Mental Status: She is alert and oriented to person, place, and time. Mental status is at baseline.  Psychiatric:        Mood and Affect: Mood normal.        Behavior: Behavior normal.        Thought Content: Thought content normal.        Judgment: Judgment normal.     Results for orders placed or performed in visit on 04/05/20  Microscopic Examination   Urine  Result Value Ref Range   WBC, UA 0-5 0 - 5 /hpf   RBC None seen 0 - 2 /hpf   Epithelial Cells (non renal) 0-10 0 - 10 /hpf   Bacteria, UA None seen None seen/Few  CBC with Differential/Platelet  Result Value Ref Range   WBC 7.4 3.4 - 10.8 x10E3/uL   RBC 3.95 3.77 - 5.28 x10E6/uL   Hemoglobin 11.9 11.1 - 15.9 g/dL   Hematocrit 36.4 34.0 - 46.6 %   MCV 92 79 - 97 fL   MCH 30.1 26.6 - 33.0 pg   MCHC 32.7 31 - 35 g/dL   RDW 12.8 11.7 - 15.4 %   Platelets 294 150 - 450 x10E3/uL   Neutrophils 61 Not Estab. %   Lymphs 30 Not Estab. %   Monocytes 7 Not Estab. %   Eos 1 Not Estab. %   Basos 1 Not Estab. %   Neutrophils Absolute 4.6 1 - 7 x10E3/uL   Lymphocytes Absolute 2.2 0 - 3 x10E3/uL   Monocytes Absolute 0.5 0 - 0 x10E3/uL   EOS (ABSOLUTE) 0.1 0.0 - 0.4 x10E3/uL   Basophils Absolute 0.1 0 - 0 x10E3/uL   Immature  Granulocytes 0 Not Estab. %   Immature Grans (Abs) 0.0 0.0 - 0.1 x10E3/uL  Comprehensive metabolic panel  Result Value Ref Range   Glucose 79 65 - 99 mg/dL   BUN 8 6 - 20 mg/dL   Creatinine, Ser 0.75 0.57 - 1.00  mg/dL   GFR calc non Af Amer 101 >59 mL/min/1.73   GFR calc Af Amer 117 >59 mL/min/1.73   BUN/Creatinine Ratio 11 9 - 23   Sodium 143 134 - 144 mmol/L   Potassium 3.9 3.5 - 5.2 mmol/L   Chloride 109 (H) 96 - 106 mmol/L   CO2 22 20 - 29 mmol/L   Calcium 9.0 8.7 - 10.2 mg/dL   Total Protein 6.9 6.0 - 8.5 g/dL   Albumin 4.0 3.8 - 4.8 g/dL   Globulin, Total 2.9 1.5 - 4.5 g/dL   Albumin/Globulin Ratio 1.4 1.2 - 2.2   Bilirubin Total <0.2 0.0 - 1.2 mg/dL   Alkaline Phosphatase 79 48 - 121 IU/L   AST 15 0 - 40 IU/L   ALT 14 0 - 32 IU/L  Lipid Panel w/o Chol/HDL Ratio  Result Value Ref Range   Cholesterol, Total 154 100 - 199 mg/dL   Triglycerides 86 0 - 149 mg/dL   HDL 45 >39 mg/dL   VLDL Cholesterol Cal 16 5 - 40 mg/dL   LDL Chol Calc (NIH) 93 0 - 99 mg/dL  TSH  Result Value Ref Range   TSH 0.574 0.450 - 4.500 uIU/mL  Urinalysis, Routine w reflex microscopic  Result Value Ref Range   Specific Gravity, UA 1.020 1.005 - 1.030   pH, UA 7.0 5.0 - 7.5   Color, UA Yellow Yellow   Appearance Ur Clear Clear   Leukocytes,UA Negative Negative   Protein,UA Negative Negative/Trace   Glucose, UA Negative Negative   Ketones, UA Negative Negative   RBC, UA Trace (A) Negative   Bilirubin, UA Negative Negative   Urobilinogen, Ur 1.0 0.2 - 1.0 mg/dL   Nitrite, UA Negative Negative   Microscopic Examination See below:   Hepatitis C Antibody  Result Value Ref Range   Hep C Virus Ab <0.1 0.0 - 0.9 s/co ratio      Assessment & Plan:   Problem List Items Addressed This Visit      Other   Attention deficit disorder (ADD) in adult    Under good control on current regimen. Continue current regimen. Continue to monitor. Call with any concerns. Refills given for 3 months. Follow up 3  months.          Other Visit Diagnoses    Need for influenza vaccination    -  Primary   Flu shot given.    Relevant Orders   Flu Vaccine QUAD 6+ mos PF IM (Fluarix Quad PF)   RLQ abdominal pain       Likely due to psoas spasm. Will treat with methocarbamol and stretches and check labs. Await results. Treat as needed.    Relevant Orders   Comprehensive metabolic panel   UA/M w/rflx Culture, Routine   CBC with Differential/Platelet   Blurred vision       Has an appointment to see opthalmology in October. Keep appointment.        Follow up plan: Return in about 3 months (around 10/01/2020).

## 2020-07-03 NOTE — Assessment & Plan Note (Signed)
Under good control on current regimen. Continue current regimen. Continue to monitor. Call with any concerns. Refills given for 3 months. Follow up 3 months.

## 2020-07-04 LAB — CBC WITH DIFFERENTIAL/PLATELET
Basophils Absolute: 0 10*3/uL (ref 0.0–0.2)
Basos: 0 %
EOS (ABSOLUTE): 0.2 10*3/uL (ref 0.0–0.4)
Eos: 2 %
Hematocrit: 35.3 % (ref 34.0–46.6)
Hemoglobin: 11.7 g/dL (ref 11.1–15.9)
Immature Grans (Abs): 0 10*3/uL (ref 0.0–0.1)
Immature Granulocytes: 0 %
Lymphocytes Absolute: 2.7 10*3/uL (ref 0.7–3.1)
Lymphs: 30 %
MCH: 30.4 pg (ref 26.6–33.0)
MCHC: 33.1 g/dL (ref 31.5–35.7)
MCV: 92 fL (ref 79–97)
Monocytes Absolute: 0.6 10*3/uL (ref 0.1–0.9)
Monocytes: 6 %
Neutrophils Absolute: 5.6 10*3/uL (ref 1.4–7.0)
Neutrophils: 62 %
Platelets: 340 10*3/uL (ref 150–450)
RBC: 3.85 x10E6/uL (ref 3.77–5.28)
RDW: 13.1 % (ref 11.7–15.4)
WBC: 9.2 10*3/uL (ref 3.4–10.8)

## 2020-07-04 LAB — COMPREHENSIVE METABOLIC PANEL
ALT: 15 IU/L (ref 0–32)
AST: 12 IU/L (ref 0–40)
Albumin/Globulin Ratio: 1.5 (ref 1.2–2.2)
Albumin: 4.3 g/dL (ref 3.8–4.8)
Alkaline Phosphatase: 66 IU/L (ref 48–121)
BUN/Creatinine Ratio: 8 — ABNORMAL LOW (ref 9–23)
BUN: 6 mg/dL (ref 6–20)
Bilirubin Total: <0.2 mg/dL (ref 0.0–1.2)
CO2: 22 mmol/L (ref 20–29)
Calcium: 9.2 mg/dL (ref 8.7–10.2)
Chloride: 105 mmol/L (ref 96–106)
Creatinine, Ser: 0.8 mg/dL (ref 0.57–1.00)
GFR calc Af Amer: 108 mL/min/{1.73_m2} (ref >59)
GFR calc non Af Amer: 94 mL/min/{1.73_m2} (ref >59)
Globulin, Total: 2.9 g/dL (ref 1.5–4.5)
Glucose: 93 mg/dL (ref 65–99)
Potassium: 3.7 mmol/L (ref 3.5–5.2)
Sodium: 138 mmol/L (ref 134–144)
Total Protein: 7.2 g/dL (ref 6.0–8.5)

## 2020-07-05 ENCOUNTER — Telehealth (INDEPENDENT_AMBULATORY_CARE_PROVIDER_SITE_OTHER): Payer: Medicaid Other | Admitting: Gastroenterology

## 2020-07-05 ENCOUNTER — Telehealth: Payer: Self-pay

## 2020-07-05 ENCOUNTER — Other Ambulatory Visit: Payer: Self-pay

## 2020-07-05 DIAGNOSIS — K5080 Crohn's disease of both small and large intestine without complications: Secondary | ICD-10-CM | POA: Diagnosis not present

## 2020-07-05 MED ORDER — NA SULFATE-K SULFATE-MG SULF 17.5-3.13-1.6 GM/177ML PO SOLN: ORAL | 0 refills | Status: DC

## 2020-07-05 MED ORDER — BUDESONIDE ER 9 MG PO TB24: 1.0000 | ORAL_TABLET | Freq: Every day | ORAL | 0 refills | Status: DC

## 2020-07-05 NOTE — Progress Notes (Signed)
[name], MD [address]  [place]  [place], [place] [address]  Main: [phone]  Fax: [phone]   Primary Care Physician: [name], DO  Virtual Visit via Video Note  I connected with patient on 07/05/20 at  1:30 PM EDT by video (using [email or web address]) and verified that I am speaking with the correct person using two identifiers.   I discussed the limitations, risks, security and privacy concerns of performing an evaluation and management service by video and the availability of in person appointments. I also discussed with the patient that there may be a patient responsible charge related to this service. The patient expressed understanding and agreed to proceed.  Location of Patient: Home Location of Provider: Home Persons involved: Patient and provider only (Nursing staff checked in patient via phone but were not physically involved in the video interaction - see their notes)   History of Present Illness: Chief complaint: Crohn's disease  HPI: Cheryl Flowers is a 39 y.o. female here for follow-up of Crohn's disease.  Patient was last seen on June 07, 2020 and budesonide had to be restarted as patient only took budesonide for about a month when it was first prescribed in May 2021, when it was initially started and then did not follow-up with [place] as she noted improvement in symptoms.  Since June 07, 2020, she states she has been compliant with her budesonide and has been taking 3 pills a day.  Reports significant improvement in symptoms since then.  No further abdominal pain.  No further diarrhea.  Is actually having constipation where she is only having about 4 days of bowel movements in 1 week.  No fever or chills.  Index colonoscopy May 2021 Findings:     - Nonbleeding ulcerated mucosa with no stigmata of recent bleeding were       present at the ileocecal valve.     - Terminal ileum could not be intubated, due to ulceration       present just  proximal to the Ileocecal valve    - Patchy moderate inflammation characterized by altered vascularity,       erythema and shallow ulcerations was found in the cecum.     - Patchy mild inflammation characterized by erythema and shallow       ulcerations was found in the transverse colon and in the ascending       colon.     - The descending colon appeared normal.    -  A patchy area of mildly erythematous mucosa was found in the rectum and       in the sigmoid colon.   There did not appear to be a stricture in the terminal ileum, but rather just inflammation, and therefore it was not intubated to avoid complications such as perforation.  Pathology:  A. COLON, CECUM; COLD BIOPSY:  - CHRONIC COLITIS WITH FOCAL MILD ACTIVITY.  - NEGATIVE FOR DYSPLASIA AND MALIGNANCY.   B. ILEOCECAL VALVE; COLD BIOPSY:  - CHRONIC ENTERITIS / COLITIS WITH MODERATE ACTIVITY AND ULCERATION.  - NEGATIVE FOR DYSPLASIA AND MALIGNANCY.   C. COLON, ASCENDING; COLD BIOPSY:  - CHRONIC COLITIS WITH MODERATE ACTIVITY.  - NEGATIVE FOR DYSPLASIA AND MALIGNANCY.   D. COLON, TRANSVERSE; COLD BIOPSY:  - CHRONIC COLITIS WITH MODERATE ACTIVITY.  - NEGATIVE FOR DYSPLASIA AND MALIGNANCY.   E. COLON, DESCENDING; COLD BIOPSY:  - CHRONIC INACTIVE COLITIS.  - NEGATIVE FOR DYSPLASIA AND MALIGNANCY.   F. COLON, RECTOSIGMOID; COLD BIOPSY:  -  CHRONIC INACTIVE COLITIS.  - NEGATIVE FOR DYSPLASIA AND MALIGNANCY.   CDAI score was 120.  MRE was obtained and showed mild wall thickening and mucosal enhancement involving the distal ileum and cecum.   Based on CDAI score and colonoscopy findings, patient was considered low risk and budesonide was started.  Gold QuantiFERON is negative.  TPMT is normal.  Patient is hep B immune based on blood work.  Based on guidelines in these patients, plan was to treat with budesonide for total of 8 to 12 weeks with taper, and once remission is achieved, start maintenance.  Current Outpatient  Medications  Medication Sig Dispense Refill  . albuterol (VENTOLIN HFA) 108 (90 Base) MCG/ACT inhaler Inhale 1 puff into the lungs every 6 (six) hours as needed for wheezing or shortness of breath. 18 g 3  . amphetamine-dextroamphetamine (ADDERALL XR) 15 MG 24 hr capsule Take 1 capsule by mouth every morning. 30 capsule 0  . [START ON 08/03/2020] amphetamine-dextroamphetamine (ADDERALL XR) 15 MG 24 hr capsule Take 1 capsule by mouth every morning. 30 capsule 0  . [START ON 09/02/2020] amphetamine-dextroamphetamine (ADDERALL XR) 15 MG 24 hr capsule Take 1 capsule by mouth every morning. 30 capsule 0  . budesonide (ENTOCORT EC) 3 MG 24 hr capsule Take 3 capsules (9 mg total) by mouth daily. 180 capsule 0  . DULoxetine (CYMBALTA) 60 MG capsule Take 1 capsule (60 mg total) by mouth daily. 90 capsule 1  . methocarbamol (ROBAXIN) 500 MG tablet Take 0.5-1 tablets (250-500 mg total) by mouth every 8 (eight) hours as needed for muscle spasms. 90 tablet 1  . metoprolol succinate (TOPROL-XL) 25 MG 24 hr tablet Take 1 tablet (25 mg total) by mouth daily. 90 tablet 0  . norethindrone (AYGESTIN) 5 MG tablet Take 1 tablet by mouth daily.     No current facility-administered medications for this visit.    Allergies as of 07/05/2020 - Review Complete 07/03/2020  Allergen Reaction Noted  . Morphine and related Itching 12/15/2015  . Sulfa antibiotics Hives 12/15/2015    Review of Systems:    All systems reviewed and negative except where noted in HPI.   Observations/Objective:  Labs: CMP     Component Value Date/Time   NA 138 07/03/2020 1044   K 3.7 07/03/2020 1044   CL 105 07/03/2020 1044   CO2 22 07/03/2020 1044   GLUCOSE 93 07/03/2020 1044   GLUCOSE 101 (H) 02/20/2018 0138   BUN 6 07/03/2020 1044   CREATININE 0.80 07/03/2020 1044   CALCIUM 9.2 07/03/2020 1044   PROT 7.2 07/03/2020 1044   ALBUMIN 4.3 07/03/2020 1044   AST 12 07/03/2020 1044   ALT 15 07/03/2020 1044   ALKPHOS 66 07/03/2020  1044   BILITOT <0.2 07/03/2020 1044   GFRNONAA 94 07/03/2020 1044   GFRAA 108 07/03/2020 1044   Lab Results  Component Value Date   WBC 9.2 07/03/2020   HGB 11.7 07/03/2020   HCT 35.3 07/03/2020   MCV 92 07/03/2020   PLT 340 07/03/2020    Imaging Studies: No results found.  Assessment and Plan:   Cheryl Flowers is a 39 y.o. y/o female here for follow-up of Crohn's ileocolitis  Assessment and Plan: We will schedule patient for a repeat colonoscopy in the next 2 to 3 weeks since the index colonoscopy was in May 2021 and since then patient has had some noncompliance with medications and follow-up, to see what the disease is now and determine further management based on this  If patient has improvement in disease, continue and complete budesonide therapy over the course of 8 to 12 weeks and then start maintenance therapy  If not, we will need to switch therapies altogether  Patient agreeable with above plan  I have discussed alternative options, risks & benefits,  which include, but are not limited to, bleeding, infection, perforation,respiratory complication & drug reaction.  The patient agrees with this plan & written consent will be obtained.     Follow Up Instructions:    I discussed the assessment and treatment plan with the patient. The patient was provided an opportunity to ask questions and all were answered. The patient agreed with the plan and demonstrated an understanding of the instructions.   The patient was advised to call back or seek an in-person evaluation if the symptoms worsen or if the condition fails to improve as anticipated.  I provided 15 minutes of face-to-face time via video software during this encounter. Additional time was spent in reviewing patient's chart, placing orders etc.   Virgel Manifold, MD  Speech recognition software was used to dictate this note.

## 2020-07-05 NOTE — Telephone Encounter (Signed)
Patient got in contact with me through [place] and she agreed for her to have her colonoscopy done on 07/27/2020 with Dr. Bonna Gains at [facility]. Patient was instructed that her information will be sent through [place] since she would not answer my calls. Patient's prep was sent to her pharmacy.

## 2020-07-05 NOTE — Telephone Encounter (Signed)
Called patient and left her a voicemail letting her know that Dr. Bonna Gains wanted her to schedule a colonoscopy in 2-3 weeks and no later than that.  Colonoscopy is for Crohn's.

## 2020-07-25 ENCOUNTER — Other Ambulatory Visit
Admission: RE | Admit: 2020-07-25 | Discharge: 2020-07-25 | Disposition: A | Discharge status: Home / Self Care | Payer: Medicaid Other | Source: Ambulatory Visit | Attending: Gastroenterology | Admitting: Gastroenterology

## 2020-07-25 ENCOUNTER — Other Ambulatory Visit: Payer: Self-pay

## 2020-07-25 DIAGNOSIS — Z20822 Contact with and (suspected) exposure to covid-19: Secondary | ICD-10-CM | POA: Insufficient documentation

## 2020-07-25 LAB — SARS CORONAVIRUS 2 (TAT 6-24 HRS): SARS Coronavirus 2: NEGATIVE

## 2020-07-27 ENCOUNTER — Ambulatory Visit: Payer: Medicaid Other | Admitting: Anesthesiology

## 2020-07-27 ENCOUNTER — Ambulatory Visit
Admission: RE | Admit: 2020-07-27 | Discharge: 2020-07-27 | Disposition: A | Discharge status: Home / Self Care | Payer: Medicaid Other | Attending: Gastroenterology | Admitting: Gastroenterology

## 2020-07-27 ENCOUNTER — Other Ambulatory Visit: Payer: Self-pay

## 2020-07-27 ENCOUNTER — Encounter
Admission: RE | Disposition: A | Discharge status: Home / Self Care | Payer: Self-pay | Source: Home / Self Care | Attending: Gastroenterology

## 2020-07-27 DIAGNOSIS — F419 Anxiety disorder, unspecified: Secondary | ICD-10-CM | POA: Insufficient documentation

## 2020-07-27 DIAGNOSIS — Z885 Allergy status to narcotic agent status: Secondary | ICD-10-CM | POA: Insufficient documentation

## 2020-07-27 DIAGNOSIS — Z833 Family history of diabetes mellitus: Secondary | ICD-10-CM | POA: Insufficient documentation

## 2020-07-27 DIAGNOSIS — Z1211 Encounter for screening for malignant neoplasm of colon: Secondary | ICD-10-CM | POA: Diagnosis not present

## 2020-07-27 DIAGNOSIS — F32A Depression, unspecified: Secondary | ICD-10-CM | POA: Diagnosis not present

## 2020-07-27 DIAGNOSIS — Z79899 Other long term (current) drug therapy: Secondary | ICD-10-CM | POA: Insufficient documentation

## 2020-07-27 DIAGNOSIS — Z803 Family history of malignant neoplasm of breast: Secondary | ICD-10-CM | POA: Diagnosis not present

## 2020-07-27 DIAGNOSIS — K5090 Crohn's disease, unspecified, without complications: Secondary | ICD-10-CM | POA: Diagnosis not present

## 2020-07-27 DIAGNOSIS — Z882 Allergy status to sulfonamides status: Secondary | ICD-10-CM | POA: Insufficient documentation

## 2020-07-27 DIAGNOSIS — F410 Panic disorder [episodic paroxysmal anxiety] without agoraphobia: Secondary | ICD-10-CM | POA: Diagnosis not present

## 2020-07-27 DIAGNOSIS — K5080 Crohn's disease of both small and large intestine without complications: Secondary | ICD-10-CM | POA: Diagnosis not present

## 2020-07-27 DIAGNOSIS — Z8249 Family history of ischemic heart disease and other diseases of the circulatory system: Secondary | ICD-10-CM | POA: Insufficient documentation

## 2020-07-27 DIAGNOSIS — K5010 Crohn's disease of large intestine without complications: Secondary | ICD-10-CM | POA: Diagnosis not present

## 2020-07-27 DIAGNOSIS — Z7951 Long term (current) use of inhaled steroids: Secondary | ICD-10-CM | POA: Diagnosis not present

## 2020-07-27 DIAGNOSIS — Z9049 Acquired absence of other specified parts of digestive tract: Secondary | ICD-10-CM | POA: Insufficient documentation

## 2020-07-27 DIAGNOSIS — E010 Iodine-deficiency related diffuse (endemic) goiter: Secondary | ICD-10-CM | POA: Diagnosis not present

## 2020-07-27 DIAGNOSIS — F17210 Nicotine dependence, cigarettes, uncomplicated: Secondary | ICD-10-CM | POA: Diagnosis not present

## 2020-07-27 DIAGNOSIS — Z8261 Family history of arthritis: Secondary | ICD-10-CM | POA: Insufficient documentation

## 2020-07-27 HISTORY — PX: COLONOSCOPY WITH PROPOFOL: SHX5780

## 2020-07-27 LAB — POCT PREGNANCY, URINE: Preg Test, Ur: NEGATIVE

## 2020-07-27 SURGERY — COLONOSCOPY WITH PROPOFOL
Anesthesia: General

## 2020-07-27 MED ORDER — PROPOFOL 10 MG/ML IV BOLUS
INTRAVENOUS | Status: AC
  Filled 2020-07-27: qty 20

## 2020-07-27 MED ORDER — SODIUM CHLORIDE 0.9 % IV SOLN
INTRAVENOUS | Status: DC
  Administered 2020-07-27: 1000 mL via INTRAVENOUS

## 2020-07-27 MED ORDER — PROPOFOL 10 MG/ML IV BOLUS
INTRAVENOUS | Status: DC | PRN
  Administered 2020-07-27: 125 ug/kg/min via INTRAVENOUS
  Administered 2020-07-27: 80 mg via INTRAVENOUS

## 2020-07-27 MED ORDER — PROPOFOL 500 MG/50ML IV EMUL
INTRAVENOUS | Status: AC
  Filled 2020-07-27: qty 50

## 2020-07-27 MED ORDER — LIDOCAINE HCL (PF) 1 % IJ SOLN
INTRAMUSCULAR | Status: AC
  Filled 2020-07-27: qty 2

## 2020-07-27 NOTE — Anesthesia Procedure Notes (Signed)
Procedure Name: General with mask airway Date/Time: 07/27/2020 11:04 AM Performed by: Gentry Fitz, CRNA Pre-anesthesia Checklist: Patient identified, Emergency Drugs available, Suction available and Patient being monitored Patient Re-evaluated:Patient Re-evaluated prior to induction Oxygen Delivery Method: Simple face mask Preoxygenation: Pre-oxygenation with 100% oxygen Induction Type: IV induction

## 2020-07-27 NOTE — Transfer of Care (Signed)
Immediate Anesthesia Transfer of Care Note  Patient: Cheryl Flowers  Procedure(s) Performed: COLONOSCOPY WITH PROPOFOL (N/A )  Patient Location: PACU  Anesthesia Type:General  Level of Consciousness: drowsy  Airway & Oxygen Therapy: Patient Spontanous Breathing  Post-op Assessment: Report given to RN and Post -op Vital signs reviewed and stable  Post vital signs: Reviewed and stable  Last Vitals:  Vitals Value Taken Time  BP    Temp    Pulse 94 07/27/20 1137  Resp 23 07/27/20 1137  SpO2 100 % 07/27/20 1137  Vitals shown include unvalidated device data.  Last Pain:  Vitals:   07/27/20 1020  TempSrc: Temporal  PainSc: 0-No pain         Complications: No complications documented.

## 2020-07-27 NOTE — Op Note (Signed)
[facility] Gastroenterology Patient Name: Cheryl Flowers Procedure Date: 07/27/2020 10:58 AM MRN: [id number] Account #: [id number] Date of Birth: Mar 21, 1981 Admit Type: Outpatient Age: 39 Room: [facility] ENDO ROOM 2 Gender: Female Note Status: Finalized Procedure:             Colonoscopy Indications:           Follow-up of Crohn's disease of the small bowel and                         colon Providers:             Heiress Williamson B. Bonna Gains MD, MD Referring MD:          [name] (Referring MD) Medicines:             Monitored Anesthesia Care Complications:         No immediate complications. Procedure:             Pre-Anesthesia Assessment:                        - Prior to the procedure, a History and Physical was                         performed, and patient medications, allergies and                         sensitivities were reviewed. The patient's tolerance                         of previous anesthesia was reviewed.                        - The risks and benefits of the procedure and the                         sedation options and risks were discussed with the                         patient. All questions were answered and informed                         consent was obtained.                        - Patient identification and proposed procedure were                         verified prior to the procedure by the physician, the                         nurse, the anesthesiologist, the anesthetist and the                         technician. The procedure was verified in the                         pre-procedure area in the procedure room in the  endoscopy suite.                        - Prophylactic Antibiotics: The patient does not                         require prophylactic antibiotics.                        - ASA Grade Assessment: II - A patient with mild                         systemic disease.                        -  After reviewing the risks and benefits, the patient                         was deemed in satisfactory condition to undergo the                         procedure.                        - Monitored anesthesia care was determined to be                         medically necessary for this procedure based on review                         of the patient's medical history, medications, and                         prior anesthesia history.                        - The anesthesia plan was to use monitored anesthesia                         care (MAC).                        After obtaining informed consent, the colonoscope was                         passed under direct vision. Throughout the procedure,                         the patient's blood pressure, pulse, and oxygen                         saturations were monitored continuously. The                         Colonoscope was introduced through the anus and                         advanced to the the terminal ileum. The colonoscopy                         was performed with ease. The  patient tolerated the                         procedure well. The quality of the bowel preparation                         was adequate. Findings:      The perianal and digital rectal examinations were normal.      The terminal ileum appeared normal. Biopsies were taken with a cold       forceps for histology. Very short distance of TI intubated due to       angulation but appeared normal.      Inflammation characterized by altered vascularity, erythema, friability       and shallow ulcerations was found as small patches surrounded by normal       mucosa in the transverse colon, in the ascending colon and at the cecum.       This was moderate in severity, and when compared to previous       examinations, the findings are worsened. Biopsies were taken with a cold       forceps for histology. Majority of the disease was in the cecum and       ascending colon.  Some ulceratins were also seen in the Transverse colon.       1-2 small ulcers seen in the left colon. Mild erythema seen in the       rectum, and the left colon otherwise appeared normal.      The retroflexed view of the distal rectum and anal verge was normal and       showed no anal or rectal abnormalities. Impression:            - The examined portion of the ileum was normal.                         Biopsied.                        - Crohn's disease. Inflammation was found in the                         transverse colon, in the ascending colon and at the                         cecum. This was moderate in severity, worsened                         compared to previous examinations. Biopsied.                        - The distal rectum and anal verge are normal on                         retroflexion view. Recommendation:        - Pt will need step up therapy. Follow up in clinic                         next week to discuss pathology and change in  medications.                        - Continue present medications.                        - The findings and recommendations were discussed with                         the patient.                        - The findings and recommendations were discussed with                         the patient's family. Procedure Code(s):     --- Professional ---                        [phone], Colonoscopy, flexible; with biopsy, single or                         multiple Diagnosis Code(s):     --- Professional ---                        K50.80, Crohn's disease of both small and large                         intestine without complications CPT copyright 2019 American Medical Association. All rights reserved. The codes documented in this report are preliminary and upon coder review may  be revised to meet current compliance requirements.  [name], MD Margretta Sidle B. Bonna Gains MD, MD 07/27/2020 11:50:09 AM This report has been  signed electronically. Number of Addenda: 0 Note Initiated On: 07/27/2020 10:58 AM Scope Withdrawal Time: 0 hours 17 minutes 37 seconds  Total Procedure Duration: 0 hours 23 minutes 38 seconds  Estimated Blood Loss:  Estimated blood loss: none.      [facility]

## 2020-07-27 NOTE — Anesthesia Postprocedure Evaluation (Signed)
Anesthesia Post Note  Patient: Cheryl Flowers  Procedure(s) Performed: COLONOSCOPY WITH PROPOFOL (N/A )  Patient location during evaluation: Endoscopy Anesthesia Type: General Level of consciousness: awake and alert Pain management: pain level controlled Vital Signs Assessment: post-procedure vital signs reviewed and stable Respiratory status: spontaneous breathing and respiratory function stable Cardiovascular status: stable Anesthetic complications: no   No complications documented.   Last Vitals:  Vitals:   07/27/20 1135 07/27/20 1145  BP: 110/73 123/73  Pulse: (!) 101   Resp: 20   Temp: 36.8 C   SpO2:      Last Pain:  Vitals:   07/27/20 1145  TempSrc:   PainSc: 0-No pain                 Janicia Monterrosa K

## 2020-07-27 NOTE — H&P (Signed)
Cheryl Antigua, MD [address] [place], [place], [place], [address] [address], [place], [place], [place], [address] Phone: [phone]  Fax: [phone]  Primary Care Physician:  [name], DO   Pre-Procedure History & Physical: HPI:  Cheryl Flowers is a 39 y.o. female is here for a colonoscopy.   Past Medical History:  Diagnosis Date  . Anxiety   . Depression   . Low back pain   . Marital conflict   . Panic disorder   . Thyromegaly     Past Surgical History:  Procedure Laterality Date  . CESAREAN SECTION  2003 and 2010  . CHOLECYSTECTOMY  2007  . COLONOSCOPY WITH PROPOFOL N/A 03/07/2020   Procedure: COLONOSCOPY WITH PROPOFOL;  Surgeon: Virgel Manifold, MD;  Location: [facility] ENDOSCOPY;  Service: Endoscopy;  Laterality: N/A;  . TUBAL LIGATION  2010    Prior to Admission medications   Medication Sig Start Date End Date Taking? Authorizing Provider  albuterol (VENTOLIN HFA) 108 (90 Base) MCG/ACT inhaler Inhale 1 puff into the lungs every 6 (six) hours as needed for wheezing or shortness of breath. 07/03/20   Johnson, Megan P, DO  amphetamine-dextroamphetamine (ADDERALL XR) 15 MG 24 hr capsule Take 1 capsule by mouth every morning. 07/04/20 08/03/20  Park Liter P, DO  amphetamine-dextroamphetamine (ADDERALL XR) 15 MG 24 hr capsule Take 1 capsule by mouth every morning. 08/03/20 09/02/20  Park Liter P, DO  amphetamine-dextroamphetamine (ADDERALL XR) 15 MG 24 hr capsule Take 1 capsule by mouth every morning. 09/02/20 10/02/20  Johnson, Megan P, DO  Budesonide ER 9 MG TB24 Take 1 tablet by mouth daily. 07/05/20   Virgel Manifold, MD  DULoxetine (CYMBALTA) 60 MG capsule Take 1 capsule (60 mg total) by mouth daily. 07/03/20   Johnson, Megan P, DO  methocarbamol (ROBAXIN) 500 MG tablet Take 0.5-1 tablets (250-500 mg total) by mouth every 8 (eight) hours as needed for muscle spasms. 07/03/20   Johnson, Megan P, DO  metoprolol succinate (TOPROL-XL) 25 MG 24  hr tablet Take 1 tablet (25 mg total) by mouth daily. 07/03/20   Johnson, Megan P, DO  Na Sulfate-K Sulfate-Mg Sulf 17.5-3.13-1.6 GM/177ML SOLN At 5 PM the day before procedure take 1 bottle and 5 hours before procedure take 1 bottle. 07/05/20   Virgel Manifold, MD  norethindrone (AYGESTIN) 5 MG tablet Take 1 tablet by mouth daily. 06/04/20 06/04/21  [provider]    Allergies as of 07/06/2020 - Review Complete 07/03/2020  Allergen Reaction Noted  . Morphine and related Itching 12/15/2015  . Sulfa antibiotics Hives 12/15/2015    Family History  Problem Relation Age of Onset  . Hypertension Mother   . Rheum arthritis Mother   . Diabetes Father   . Breast cancer Maternal Aunt        early 71  . Crohn's disease Maternal Aunt     Social History   Socioeconomic History  . Marital status: Married    Spouse name: Not on file  . Number of children: Not on file  . Years of education: Not on file  . Highest education level: Not on file  Occupational History  . Not on file  Tobacco Use  . Smoking status: Current Every Day Smoker    Packs/day: 0.25    Years: 19.00    Pack years: 4.75    Types: Cigarettes    Last attempt to quit: 09/18/2019    Years since quitting: 0.8  . Smokeless tobacco:  Never Used  Vaping Use  . Vaping Use: Former  Substance and Sexual Activity  . Alcohol use: Not Currently  . Drug use: No  . Sexual activity: Not on file  Other Topics Concern  . Not on file  Social History Narrative  . Not on file   Social Determinants of Health   Financial Resource Strain:   . Difficulty of Paying Living Expenses: Not on file  Food Insecurity:   . Worried About Charity fundraiser in the Last Year: Not on file  . Ran Out of Food in the Last Year: Not on file  Transportation Needs:   . Lack of Transportation (Medical): Not on file  . Lack of Transportation (Non-Medical): Not on file  Physical Activity:   . Days of Exercise per Week: Not on file  . Minutes  of Exercise per Session: Not on file  Stress:   . Feeling of Stress : Not on file  Social Connections:   . Frequency of Communication with Friends and Family: Not on file  . Frequency of Social Gatherings with Friends and Family: Not on file  . Attends Religious Services: Not on file  . Active Member of Clubs or Organizations: Not on file  . Attends Archivist Meetings: Not on file  . Marital Status: Not on file  Intimate Partner Violence:   . Fear of Current or Ex-Partner: Not on file  . Emotionally Abused: Not on file  . Physically Abused: Not on file  . Sexually Abused: Not on file    Review of Systems: See HPI, otherwise negative ROS  Physical Exam: BP 120/83   Pulse 99   Temp 98.1 F (36.7 C) (Temporal)   Resp 17   Ht 5' 4"  (1.626 m)   Wt 93.9 kg   SpO2 100%   BMI 35.53 kg/m  General:   Alert,  pleasant and cooperative in NAD Head:  Normocephalic and atraumatic. Neck:  Supple; no masses or thyromegaly. Lungs:  Clear throughout to auscultation, normal respiratory effort.    Heart:  +S1, +S2, Regular rate and rhythm, No edema. Abdomen:  Soft, nontender and nondistended. Normal bowel sounds, without guarding, and without rebound.   Neurologic:  Alert and  oriented x4;  grossly normal neurologically.  Impression/Plan: Cheryl Flowers is here for a colonoscopy to be performed for ulcerative colitis.  Risks, benefits, limitations, and alternatives regarding  colonoscopy have been reviewed with the patient.  Questions have been answered.  All parties agreeable.   Virgel Manifold, MD  07/27/2020, 10:53 AM

## 2020-07-27 NOTE — Anesthesia Preprocedure Evaluation (Signed)
Anesthesia Evaluation  Patient identified by MRN, date of birth, ID band Patient awake    Reviewed: Allergy & Precautions, NPO status , Patient's Chart, lab work & pertinent test results, reviewed documented beta blocker date and time   History of Anesthesia Complications Negative for: history of anesthetic complications  Airway Mallampati: II       Dental  (+) Partial Upper   Pulmonary asthma , neg sleep apnea, neg COPD, Current Smoker and Patient abstained from smoking.,           Cardiovascular hypertension, Pt. on medications and Pt. on home beta blockers (-) Past MI and (-) CHF (-) dysrhythmias (-) Valvular Problems/Murmurs     Neuro/Psych neg Seizures Anxiety Depression    GI/Hepatic Neg liver ROS, neg GERD  ,  Endo/Other  neg diabetes  Renal/GU negative Renal ROS     Musculoskeletal   Abdominal   Peds  Hematology   Anesthesia Other Findings   Reproductive/Obstetrics                             Anesthesia Physical Anesthesia Plan  ASA: II  Anesthesia Plan: General   Post-op Pain Management:    Induction: Intravenous  PONV Risk Score and Plan: 2 and Propofol infusion, TIVA and Treatment may vary due to age or medical condition  Airway Management Planned: Nasal Cannula  Additional Equipment:   Intra-op Plan:   Post-operative Plan:   Informed Consent: I have reviewed the patients History and Physical, chart, labs and discussed the procedure including the risks, benefits and alternatives for the proposed anesthesia with the patient or authorized representative who has indicated his/her understanding and acceptance.       Plan Discussed with:   Anesthesia Plan Comments:         Anesthesia Quick Evaluation

## 2020-07-30 ENCOUNTER — Encounter: Payer: Self-pay | Admitting: Gastroenterology

## 2020-07-31 ENCOUNTER — Telehealth (INDEPENDENT_AMBULATORY_CARE_PROVIDER_SITE_OTHER): Payer: Medicaid Other | Admitting: Gastroenterology

## 2020-07-31 ENCOUNTER — Encounter: Payer: Self-pay | Admitting: Gastroenterology

## 2020-07-31 DIAGNOSIS — K5080 Crohn's disease of both small and large intestine without complications: Secondary | ICD-10-CM | POA: Diagnosis not present

## 2020-07-31 LAB — SURGICAL PATHOLOGY

## 2020-07-31 MED ORDER — PREDNISONE 20 MG PO TABS: 20.0000 mg | ORAL_TABLET | Freq: Two times a day (BID) | ORAL | 0 refills | Status: DC

## 2020-07-31 NOTE — Progress Notes (Signed)
Cheryl Antigua, MD [address]  [place]  [place], [place] [address]  Main: [phone]  Fax: [phone]   Primary Care Physician: [name], DO  Virtual Visit via Telephone Note  I connected with patient on 07/31/20 at  1:30 PM EDT by telephone and verified that I am speaking with the correct person using two identifiers.   I discussed the limitations, risks, security and privacy concerns of performing an evaluation and management service by telephone and the availability of in person appointments. I also discussed with the patient that there may be a patient responsible charge related to this service. The patient expressed understanding and agreed to proceed.  Location of Patient: Home Location of Provider: Home Persons involved: Patient and provider only during the visit (nursing staff and front desk staff was involved in communicating with the patient prior to the appointment, reviewing medications and checking them in)   History of Present Illness: Chief Complaint  Patient presents with  . Crohn's Disease     HPI: Cheryl Flowers is a 39 y.o. female here for follow-up of Crohn's disease.  Patient underwent repeat upper endoscopy, with terminal ileum intubation.  Terminal ileum appeared normal.  However, patient had active disease, specifically more in the proximal colon.  Biopsies are still pending.  As stated in previous notes, patient was started on budesonide when she was first diagnosed, but had lapse in therapy as she did not follow-up and self discontinued the budesonide after a month.  She then followed up after resumption of symptoms, and budesonide was restarted and she still has active disease, with worsening features identified on colonoscopy.  Given her moderate disease, patient is following up now.  Reports 1-2 formed bowel movements a day, with some blood streaks in stool.  Reports some abdominal bloating.  No fever or chills.  Denies  extraintestinal manifestations.  Repeat colonoscopy October 2021 Impression:            - The examined portion of the ileum was normal.                         Biopsied.                        - Crohn's disease. Inflammation was found in the                         transverse colon, in the ascending colon and at the                         cecum. This was moderate in severity, worsened                         compared to previous examinations. Biopsied.                        - The distal rectum and anal verge are normal on                         retroflexion view.  Index colonoscopy May 2021 Findings:     - Nonbleeding ulcerated mucosa with no stigmata of recent bleeding were       present at the ileocecal valve.     - Terminal ileum could not be intubated,  due to ulceration       present just proximal to the Ileocecal valve    - Patchy moderate inflammation characterized by altered vascularity,       erythema and shallow ulcerations was found in the cecum.     - Patchy mild inflammation characterized by erythema and shallow       ulcerations was found in the transverse colon and in the ascending       colon.     - The descending colon appeared normal.    -  A patchy area of mildly erythematous mucosa was found in the rectum and       in the sigmoid colon.   There did not appear to be a stricture in the terminal ileum, but rather just inflammation, and therefore it was not intubated to avoid complications such as perforation.  Pathology:  A. COLON, CECUM; COLD BIOPSY:  - CHRONIC COLITIS WITH FOCAL MILD ACTIVITY.  - NEGATIVE FOR DYSPLASIA AND MALIGNANCY.   B. ILEOCECAL VALVE; COLD BIOPSY:  - CHRONIC ENTERITIS / COLITIS WITH MODERATE ACTIVITY AND ULCERATION.  - NEGATIVE FOR DYSPLASIA AND MALIGNANCY.   C. COLON, ASCENDING; COLD BIOPSY:  - CHRONIC COLITIS WITH MODERATE ACTIVITY.  - NEGATIVE FOR DYSPLASIA AND MALIGNANCY.   D. COLON, TRANSVERSE; COLD BIOPSY:  - CHRONIC COLITIS  WITH MODERATE ACTIVITY.  - NEGATIVE FOR DYSPLASIA AND MALIGNANCY.   E. COLON, DESCENDING; COLD BIOPSY:  - CHRONIC INACTIVE COLITIS.  - NEGATIVE FOR DYSPLASIA AND MALIGNANCY.   F. COLON, RECTOSIGMOID; COLD BIOPSY:  - CHRONIC INACTIVE COLITIS.  - NEGATIVE FOR DYSPLASIA AND MALIGNANCY.   CDAI score was 120.  MRE was obtained and showed mild wall thickening and mucosal enhancement involving the distal ileum and cecum.   Based on CDAI score and colonoscopy findings, patient was considered low risk and budesonide was started.  Gold QuantiFERON is negative.  TPMT is normal.  Patient is hep B immune based on blood work.  Based on guidelines in these patients, plan was to treat with budesonide for total of 8 to 12 weeks with taper, and once remission is achieved, start maintenance.  Current Outpatient Medications  Medication Sig Dispense Refill  . albuterol (VENTOLIN HFA) 108 (90 Base) MCG/ACT inhaler Inhale 1 puff into the lungs every 6 (six) hours as needed for wheezing or shortness of breath. 18 g 3  . amphetamine-dextroamphetamine (ADDERALL XR) 15 MG 24 hr capsule Take 1 capsule by mouth every morning. 30 capsule 0  . [START ON 08/03/2020] amphetamine-dextroamphetamine (ADDERALL XR) 15 MG 24 hr capsule Take 1 capsule by mouth every morning. 30 capsule 0  . [START ON 09/02/2020] amphetamine-dextroamphetamine (ADDERALL XR) 15 MG 24 hr capsule Take 1 capsule by mouth every morning. 30 capsule 0  . DULoxetine (CYMBALTA) 60 MG capsule Take 1 capsule (60 mg total) by mouth daily. 90 capsule 1  . methocarbamol (ROBAXIN) 500 MG tablet Take 0.5-1 tablets (250-500 mg total) by mouth every 8 (eight) hours as needed for muscle spasms. 90 tablet 1  . metoprolol succinate (TOPROL-XL) 25 MG 24 hr tablet Take 1 tablet (25 mg total) by mouth daily. 90 tablet 0  . norethindrone (AYGESTIN) 5 MG tablet Take 1 tablet by mouth daily.    . predniSONE (DELTASONE) 20 MG tablet Take 1 tablet (20 mg total) by mouth 2  (two) times daily with a meal. 30 tablet 0   No current facility-administered medications for this visit.    Allergies as of 07/31/2020 - Review Complete  07/31/2020  Allergen Reaction Noted  . Morphine and related Itching 12/15/2015  . Sulfa antibiotics Hives 12/15/2015    Review of Systems:    All systems reviewed and negative except where noted in HPI.   Observations/Objective:  Labs: CMP     Component Value Date/Time   NA 138 07/03/2020 1044   K 3.7 07/03/2020 1044   CL 105 07/03/2020 1044   CO2 22 07/03/2020 1044   GLUCOSE 93 07/03/2020 1044   GLUCOSE 101 (H) 02/20/2018 0138   BUN 6 07/03/2020 1044   CREATININE 0.80 07/03/2020 1044   CALCIUM 9.2 07/03/2020 1044   PROT 7.2 07/03/2020 1044   ALBUMIN 4.3 07/03/2020 1044   AST 12 07/03/2020 1044   ALT 15 07/03/2020 1044   ALKPHOS 66 07/03/2020 1044   BILITOT <0.2 07/03/2020 1044   GFRNONAA 94 07/03/2020 1044   GFRAA 108 07/03/2020 1044   Lab Results  Component Value Date   WBC 9.2 07/03/2020   HGB 11.7 07/03/2020   HCT 35.3 07/03/2020   MCV 92 07/03/2020   PLT 340 07/03/2020    Imaging Studies: No results found.  Assessment and Plan:   Cheryl Flowers is a 39 y.o. y/o female here for follow-up of Crohn's ileocolitis  Assessment and Plan: Due to worsening disease, patient is now needing step up therapy  We discussed several options with the patient, and based on discussion, patient would prefer to start Stelara  We discussed adverse effects of these medications in detail, including increased risk of infections, malignancy and others.  Patient understands these risks, and chooses to continue the medication  We will start with obtaining authorization for Stelara from insurance  In the meantime, we would need to change budesonide to prednisone as patient has not helped but she remission and Stelara will take some time to get approved  Patient instructed to stop budesonide after her dose that she  already took today and start prednisone tomorrow  Follow-up with me in 2 weeks to reassess symptoms  Currently, patient does not have any alarm symptoms at this time  Follow Up Instructions:    I discussed the assessment and treatment plan with the patient. The patient was provided an opportunity to ask questions and all were answered. The patient agreed with the plan and demonstrated an understanding of the instructions.   The patient was advised to call back or seek an in-person evaluation if the symptoms worsen or if the condition fails to improve as anticipated.  I provided 15 minutes of non-face-to-face time during this encounter. Additional time was spent in reviewing patient's chart, placing orders etc.   Virgel Manifold, MD  Speech recognition software was used to dictate this note.

## 2020-07-31 NOTE — Patient Instructions (Signed)
Please stop taking your Budesonide.

## 2020-08-06 DIAGNOSIS — H5213 Myopia, bilateral: Secondary | ICD-10-CM | POA: Diagnosis not present

## 2020-08-08 ENCOUNTER — Telehealth: Payer: Self-pay

## 2020-08-08 NOTE — Telephone Encounter (Signed)
Mrs. Ronnald Ramp, RN from [facility] called stating that patient's insurance denied the approval for patient to start Stelara. Thay would want for the patient to take either Humira or Remicade. Or they would want to know in writing you are choosing Stelara. She would like to know soon so the patient could start taking a medication to help with her Crohn's. Thank you.

## 2020-08-09 NOTE — Telephone Encounter (Signed)
Called Cheryl Potters, RN from [facility] and informed her what Dr. Bonna Gains is recommending at this time. Mrs. Cheryl Flowers stated that she would call the pharmacy to let them know and that later on she would send [place] a form so Dr. Bonna Gains could sign with the prescription information.

## 2020-08-10 NOTE — Telephone Encounter (Signed)
Virgel Manifold, MD  Wayna Chalet, CMA Caller: Unspecified (2 days ago, 11:20 AM) Tell pt stelara was denied and insurance is recommending Humira. Initial: SUBQ: 160 mg , then 80 mg 2 weeks later (day 15) and then 80 mg every 2 weeks.

## 2020-08-16 ENCOUNTER — Telehealth: Payer: Self-pay

## 2020-08-16 NOTE — Telephone Encounter (Signed)
Please find out the labs and that is fine.

## 2020-08-16 NOTE — Telephone Encounter (Signed)
Number no longer in service

## 2020-08-16 NOTE — Telephone Encounter (Signed)
Routing to provider to advise. Can patient get her labs done here?

## 2020-08-16 NOTE — Telephone Encounter (Signed)
Patient notified. She will contact ordering providers office and confirm orders before she comes.

## 2020-08-16 NOTE — Telephone Encounter (Signed)
Copied from [place] [phone]. Topic: General - Other >> Aug 16, 2020  8:14 AM Leward Quan A wrote: Reason for CRM: Patient called in stated that her GI specialist want her to do some labs and she would like to have it done here at [facility] before with pcp asking for a response on My Chart please.    Pt stated she has had the labs done here at [facility] by other providers with permission of her PCP. Pec made pt aware of our policy with our lab Pt still would like for staff to ask PCP if she can have her labs done here.

## 2020-08-17 ENCOUNTER — Other Ambulatory Visit: Payer: Medicaid Other

## 2020-08-17 ENCOUNTER — Other Ambulatory Visit: Payer: Self-pay

## 2020-08-17 DIAGNOSIS — K5080 Crohn's disease of both small and large intestine without complications: Secondary | ICD-10-CM

## 2020-08-18 LAB — C-REACTIVE PROTEIN: CRP: 25 mg/L — ABNORMAL HIGH (ref 0–10)

## 2020-08-20 ENCOUNTER — Ambulatory Visit: Payer: Medicaid Other

## 2020-08-20 ENCOUNTER — Other Ambulatory Visit: Payer: Self-pay

## 2020-08-20 DIAGNOSIS — K5080 Crohn's disease of both small and large intestine without complications: Secondary | ICD-10-CM | POA: Diagnosis not present

## 2020-08-21 ENCOUNTER — Other Ambulatory Visit: Payer: Self-pay | Admitting: Gastroenterology

## 2020-08-21 LAB — CALPROTECTIN, FECAL: Calprotectin, Fecal: 227 ug/g — ABNORMAL HIGH (ref 0–120)

## 2020-08-22 MED ORDER — OMEPRAZOLE 20 MG PO CPDR: 20.0000 mg | DELAYED_RELEASE_CAPSULE | Freq: Every day | ORAL | 0 refills | Status: DC

## 2020-08-22 NOTE — Addendum Note (Signed)
Addended by: Wayna Chalet on: 08/22/2020 04:45 PM   Modules accepted: Orders

## 2020-08-22 NOTE — Telephone Encounter (Signed)
Clarene Critchley, RN from [place] to ask her about the status of Humira and she stated that the patient was approved but now waiting on insurance to let them know how much will have to pay out-of-pocket. Estill Bamberg stated that she will call me back tomorrow with more details and that the patient have been aware.

## 2020-08-23 NOTE — Telephone Encounter (Signed)
They transfer me to the PA department. Gave the representative the information for the stater does which is 38m/0.8. Inject 1628mon day 1 and 801mn day 15. 3 pens for 28 day supply. When they put the information it put a block on the PA she then transfer me to another department. She put the information in and it block her also. She then sent a message to the pharmacy call center and marked it as urgent. She said they should call with in 24 hours

## 2020-08-23 NOTE — Telephone Encounter (Signed)
Asked Dr.Tahiliani about the maintance does instructions because it is usually 86m/0.4 every 2 weeks. She states it is supposed to be 448m0.4ml every 2 weeks. [facility] states they can not get patient Humiria to get approved. Called [place] [employer] and they state the Humira 4026m.4 was approved on 08/20/2020. They said a starter does has not had a PA done. They said medications has to go through there speciality pharmacy. The number is [id number] department number is [phone]

## 2020-08-24 NOTE — Telephone Encounter (Signed)
Tried to call this morning they gave me the same response

## 2020-08-26 ENCOUNTER — Encounter: Payer: Self-pay | Admitting: Nurse Practitioner

## 2020-09-03 ENCOUNTER — Telehealth: Payer: Self-pay

## 2020-09-03 NOTE — Telephone Encounter (Signed)
Pt's husband dropped off FMLA forms to be completed about pt for his employer. Please advise. Thanks TNP

## 2020-09-05 NOTE — Telephone Encounter (Signed)
Tried calling patient's husband to see what the FMLA paperwork is for. No answer and VM not set up. Will try to reach out to him again later.

## 2020-09-06 NOTE — Telephone Encounter (Signed)
Tried calling patient's husband again. No answer and no VM set up. Will try to call again later. Forms placed in the incomplete bin until we hear back from the patient's husband.

## 2020-09-07 ENCOUNTER — Other Ambulatory Visit: Payer: Self-pay

## 2020-09-07 ENCOUNTER — Other Ambulatory Visit: Payer: Self-pay | Admitting: Gastroenterology

## 2020-09-07 MED ORDER — PREDNISONE 10 MG PO TABS: ORAL_TABLET | ORAL | 0 refills | Status: AC

## 2020-09-12 ENCOUNTER — Telehealth: Payer: Medicaid Other | Admitting: Gastroenterology

## 2020-09-13 ENCOUNTER — Telehealth (INDEPENDENT_AMBULATORY_CARE_PROVIDER_SITE_OTHER): Payer: Medicaid Other | Admitting: Gastroenterology

## 2020-09-13 ENCOUNTER — Other Ambulatory Visit: Payer: Self-pay

## 2020-09-13 ENCOUNTER — Telehealth: Payer: Self-pay

## 2020-09-13 DIAGNOSIS — K5080 Crohn's disease of both small and large intestine without complications: Secondary | ICD-10-CM

## 2020-09-13 MED ORDER — HUMIRA-CD/UC/HS STARTER 80 MG/0.8ML ~~LOC~~ AJKT: AUTO-INJECTOR | SUBCUTANEOUS | 0 refills | Status: DC

## 2020-09-13 MED ORDER — HUMIRA (2 PEN) 40 MG/0.4ML ~~LOC~~ AJKT: AUTO-INJECTOR | SUBCUTANEOUS | 0 refills | Status: DC

## 2020-09-13 NOTE — Telephone Encounter (Signed)
Talk to Cheryl Flowers and he said the online was stopping me because of the coupon and she can not use it with Medicaid. He thinks a nurse can do the teaching. He emailed me the form and I filled it out and fax it back

## 2020-09-13 NOTE — Progress Notes (Signed)
When we sent the script the first time the pharmacy needed two separate prescriptions for the starter does and then maintenance   Does.  Called and they said the medications needed to go to [facility]. Re Sent medication to them. And called them at [phone].  Called the pharmacy they have received the medication and they said the medication is correct. Medication will be shipped tomorrow

## 2020-09-13 NOTE — Telephone Encounter (Signed)
Can not enrolled patient is Humira complete program that would help patient with medication and nurse assistance because patient has medicaid

## 2020-09-14 NOTE — Progress Notes (Signed)
Cheryl Antigua, MD [address]  [place]  [place], [place] [address]  Main: [phone]  Fax: [phone]   Primary Care Physician: [name], DO  Virtual Visit via Telephone Note  I connected with patient on 09/14/20 at  2:30 PM EST by telephone and verified that I am speaking with the correct person using two identifiers.   I discussed the limitations, risks, security and privacy concerns of performing an evaluation and management service by telephone and the availability of in person appointments. I also discussed with the patient that there may be a patient responsible charge related to this service. The patient expressed understanding and agreed to proceed.  Location of Patient: Home Location of Provider: Home Persons involved: Patient and provider only during the visit (nursing staff and front desk staff was involved in communicating with the patient prior to the appointment, reviewing medications and checking them in)   History of Present Illness: Chief complaint: Follow-up of Crohn's disease  HPI: Cheryl Flowers is a 39 y.o. female with history of Crohn's ileo-colitis here for follow-up.  Was started on prednisone on last visit due to continued symptoms on budesonide.  She has tapered it down to 30 mg from 40 mg and has noted some mild abdominal pain, but no blood in stool or frequent bowel movements.  In fact she is going 1 to 2 days without bowel movements at times.  Good appetite.  No fever or chills.  Stelara was not approved by her insurance and therefore Humira was prescribed.  As of today, we were told by the pharmacy that her Humira will be shipping out tomorrow  Current Outpatient Medications  Medication Sig Dispense Refill  . Adalimumab (HUMIRA PEN) 40 MG/0.4ML PNKT Inject 59m SQ every other week 6 each 0  . Adalimumab (HUMIRA PEN-CD/UC/HS STARTER) 80 MG/0.8ML PNKT Initation: Inject 167mSQ on day 1 then 8059mn day 15. 3 each 0  .  albuterol (VENTOLIN HFA) 108 (90 Base) MCG/ACT inhaler Inhale 1 puff into the lungs every 6 (six) hours as needed for wheezing or shortness of breath. 18 g 3  . amphetamine-dextroamphetamine (ADDERALL XR) 15 MG 24 hr capsule Take 1 capsule by mouth every morning. 30 capsule 0  . amphetamine-dextroamphetamine (ADDERALL XR) 15 MG 24 hr capsule Take 1 capsule by mouth every morning. 30 capsule 0  . amphetamine-dextroamphetamine (ADDERALL XR) 15 MG 24 hr capsule Take 1 capsule by mouth every morning. 30 capsule 0  . DULoxetine (CYMBALTA) 60 MG capsule Take 1 capsule (60 mg total) by mouth daily. 90 capsule 1  . methocarbamol (ROBAXIN) 500 MG tablet Take 0.5-1 tablets (250-500 mg total) by mouth every 8 (eight) hours as needed for muscle spasms. 90 tablet 1  . metoprolol succinate (TOPROL-XL) 25 MG 24 hr tablet Take 1 tablet (25 mg total) by mouth daily. 90 tablet 0  . norethindrone (AYGESTIN) 5 MG tablet Take 1 tablet by mouth daily.    . oMarland Kitcheneprazole (PRILOSEC) 20 MG capsule Take 1 capsule (20 mg total) by mouth daily. 30 capsule 0  . predniSONE (DELTASONE) 10 MG tablet Take 3 tablets (30 mg total) by mouth daily with breakfast. 90 tablet 0   No current facility-administered medications for this visit.    Allergies as of 09/13/2020 - Review Complete 07/31/2020  Allergen Reaction Noted  . Morphine and related Itching 12/15/2015  . Sulfa antibiotics Hives 12/15/2015    Review of Systems:    All systems reviewed and negative  except where noted in HPI.   Observations/Objective:  Labs: CMP     Component Value Date/Time   NA 138 07/03/2020 1044   K 3.7 07/03/2020 1044   CL 105 07/03/2020 1044   CO2 22 07/03/2020 1044   GLUCOSE 93 07/03/2020 1044   GLUCOSE 101 (H) 02/20/2018 0138   BUN 6 07/03/2020 1044   CREATININE 0.80 07/03/2020 1044   CALCIUM 9.2 07/03/2020 1044   PROT 7.2 07/03/2020 1044   ALBUMIN 4.3 07/03/2020 1044   AST 12 07/03/2020 1044   ALT 15 07/03/2020 1044   ALKPHOS 66  07/03/2020 1044   BILITOT <0.2 07/03/2020 1044   GFRNONAA 94 07/03/2020 1044   GFRAA 108 07/03/2020 1044   Lab Results  Component Value Date   WBC 9.2 07/03/2020   HGB 11.7 07/03/2020   HCT 35.3 07/03/2020   MCV 92 07/03/2020   PLT 340 07/03/2020    Imaging Studies: No results found.  Assessment and Plan:   Cheryl Flowers is a 39 y.o. y/o female here for follow-up of Crohn's ileocolitis  Assessment and Plan: I have asked the patient to call us as soon as she receives the Humira so we can start her teaching and get her started on treatment hopefully by next week  Continue prednisone 30 mg at this time and I have asked her to decrease this to 20 mg next Thursday, November 25 and she verbalized understanding  If she has worsening symptoms when she does this I have asked her to notify us immediately and she verbalized understanding  Follow-up in 1 to 2 weeks  Follow Up Instructions:    I discussed the assessment and treatment plan with the patient. The patient was provided an opportunity to ask questions and all were answered. The patient agreed with the plan and demonstrated an understanding of the instructions.   The patient was advised to call back or seek an in-person evaluation if the symptoms worsen or if the condition fails to improve as anticipated.  I provided 15 minutes of non-face-to-face time during this encounter. Additional time was spent in reviewing patient's chart, placing orders etc.   Virgel Manifold, MD  Speech recognition software was used to dictate this note.

## 2020-09-17 ENCOUNTER — Telehealth: Payer: Self-pay

## 2020-09-17 NOTE — Telephone Encounter (Signed)
Tried to call patient to see if patient has received the Humiria. Unable to call patient because it says call can not be completed at this time. Sent mychart message

## 2020-09-18 DIAGNOSIS — R202 Paresthesia of skin: Secondary | ICD-10-CM | POA: Diagnosis not present

## 2020-09-19 ENCOUNTER — Encounter: Payer: Self-pay | Admitting: Family Medicine

## 2020-09-19 ENCOUNTER — Telehealth: Payer: Self-pay

## 2020-09-19 NOTE — Telephone Encounter (Signed)
Called the speciality pharmacy [phone] and the representative  pharmacy tech said the order has not been processed. She states we did not tell them it needed to be shipped. Informed her we talk to some on 09/13/20. She states we will get this processed. She states the patient will get the starter does on 09/21/2020 and be delivered to her house. They state we need to call the pharmacy back on 10/05/2020 to order the maintained  does.

## 2020-09-26 ENCOUNTER — Encounter: Payer: Self-pay | Admitting: Gastroenterology

## 2020-09-26 ENCOUNTER — Telehealth (INDEPENDENT_AMBULATORY_CARE_PROVIDER_SITE_OTHER): Payer: Medicaid Other | Admitting: Gastroenterology

## 2020-09-26 DIAGNOSIS — K5080 Crohn's disease of both small and large intestine without complications: Secondary | ICD-10-CM | POA: Diagnosis not present

## 2020-09-26 MED ORDER — AZATHIOPRINE 50 MG PO TABS: 50.0000 mg | ORAL_TABLET | Freq: Every day | ORAL | 2 refills | Status: AC

## 2020-09-26 NOTE — Patient Instructions (Signed)
As you stated during our conversation today 1. You are taking Prednisone 28m once a day since 09/15/2020 2. Continue to take Prednisone 114monce a day until 09/29/2020 3. Start taking prednisone 17m59m day (half of the 10 mg pill) once a day on 09/30/2020 4. Continue taking prednisone 17mg57mday until 10/04/2020 5. Stop taking all prednisone after 10/04/2020

## 2020-09-27 NOTE — Progress Notes (Signed)
[name], MD [address]  [place]  [place], [place] [address]  Main: [phone]  Fax: [phone]   Primary Care Physician: [name], DO  Virtual Visit via Telephone Note  I connected with patient on 09/27/20 at  3:00 PM EST by telephone and verified that I am speaking with the correct person using two identifiers.   I discussed the limitations, risks, security and privacy concerns of performing an evaluation and management service by telephone and the availability of in person appointments. I also discussed with the patient that there may be a patient responsible charge related to this service. The patient expressed understanding and agreed to proceed.  Location of Patient: Home Location of Provider: Home Persons involved: Patient and provider only during the visit (nursing staff and front desk staff was involved in communicating with the patient prior to the appointment, reviewing medications and checking them in)   History of Present Illness: Chief Complaint  Patient presents with  . Crohn's Disease    Patient stated that she started her Humira injection on Friday 09/21/2020 and was able to communicate with the [facility] nurse ambassador. Patient doing well with the first injection.     HPI: Cheryl Flowers is a 39 y.o. female here for follow-up of Crohn's ileocolitis.  Patient received her first dose of Humira on 09/21/2020.  Her nurse ambassador from [place] has taught her how to use it.  She states she has already noticed a difference in her symptoms with improvement in her abdominal pain with this.  Her next dose is already at her home, and our staff will contact the pharmacy on 10/05/2020 for maintenance doses subsequent to that.  Patient is now taking prednisone 10 mg daily as of 09/25/2020. No blood in stool.  She is actually taking a stool softener as sometimes she gets constipated without it.  She is not having any diarrhea.  No fever or  chills.    Current Outpatient Medications  Medication Sig Dispense Refill  . albuterol (VENTOLIN HFA) 108 (90 Base) MCG/ACT inhaler Inhale 1 puff into the lungs every 6 (six) hours as needed for wheezing or shortness of breath. 18 g 3  . amphetamine-dextroamphetamine (ADDERALL XR) 15 MG 24 hr capsule Take 1 capsule by mouth every morning. 30 capsule 0  . amphetamine-dextroamphetamine (ADDERALL XR) 15 MG 24 hr capsule Take 1 capsule by mouth every morning. 30 capsule 0  . amphetamine-dextroamphetamine (ADDERALL XR) 15 MG 24 hr capsule Take 1 capsule by mouth every morning. 30 capsule 0  . DULoxetine (CYMBALTA) 60 MG capsule Take 1 capsule (60 mg total) by mouth daily. 90 capsule 1  . methocarbamol (ROBAXIN) 500 MG tablet Take 0.5-1 tablets (250-500 mg total) by mouth every 8 (eight) hours as needed for muscle spasms. 90 tablet 1  . norethindrone (AYGESTIN) 5 MG tablet Take 1 tablet by mouth daily.    Marland Kitchen omeprazole (PRILOSEC) 20 MG capsule Take 1 capsule (20 mg total) by mouth daily. 30 capsule 0  . predniSONE (DELTASONE) 10 MG tablet Take 3 tablets (30 mg total) by mouth daily with breakfast. 90 tablet 0  . QUEtiapine (SEROQUEL) 50 MG tablet Take 50 mg by mouth as needed.    . Adalimumab (HUMIRA PEN) 40 MG/0.4ML PNKT Inject 36m SQ every other week (Patient not taking: Reported on 09/26/2020) 6 each 0  . Adalimumab (HUMIRA PEN-CD/UC/HS STARTER) 80 MG/0.8ML PNKT Initation: Inject 1678mSQ on day 1 then 80109mn day 15. (Patient not  taking: Reported on 09/26/2020) 3 each 0  . azaTHIOprine (IMURAN) 50 MG tablet Take 1 tablet (50 mg total) by mouth daily. 30 tablet 2  . metoprolol succinate (TOPROL-XL) 25 MG 24 hr tablet Take 1 tablet (25 mg total) by mouth daily. (Patient not taking: Reported on 09/26/2020) 90 tablet 0   No current facility-administered medications for this visit.    Allergies as of 09/26/2020 - Review Complete 09/26/2020  Allergen Reaction Noted  . Morphine and related Itching  12/15/2015  . Sulfa antibiotics Hives 12/15/2015    Review of Systems:    All systems reviewed and negative except where noted in HPI.   Observations/Objective:  Labs: CMP     Component Value Date/Time   NA 138 07/03/2020 1044   K 3.7 07/03/2020 1044   CL 105 07/03/2020 1044   CO2 22 07/03/2020 1044   GLUCOSE 93 07/03/2020 1044   GLUCOSE 101 (H) 02/20/2018 0138   BUN 6 07/03/2020 1044   CREATININE 0.80 07/03/2020 1044   CALCIUM 9.2 07/03/2020 1044   PROT 7.2 07/03/2020 1044   ALBUMIN 4.3 07/03/2020 1044   AST 12 07/03/2020 1044   ALT 15 07/03/2020 1044   ALKPHOS 66 07/03/2020 1044   BILITOT <0.2 07/03/2020 1044   GFRNONAA 94 07/03/2020 1044   GFRAA 108 07/03/2020 1044   Lab Results  Component Value Date   WBC 9.2 07/03/2020   HGB 11.7 07/03/2020   HCT 35.3 07/03/2020   MCV 92 07/03/2020   PLT 340 07/03/2020    Imaging Studies: No results found.  Assessment and Plan:   Cheryl Flowers is a 39 y.o. y/o female here for follow-up of Crohn's ileocolitis  Assessment and Plan: I will start Imuran along with her Humira for combination therapy The original plan was to start Stelara based on patient's wishes but insurance denied it and therefore patient was started on Humira instead  Patient is doing well on Humira.  Recheck labs in 4 to 6 weeks on combination therapy Her TPMT this year was normal I gave her instructions to taper her prednisone further.  She will decrease it to 5 mg a day on 09/30/2020 and then completely discontinue it after her last dose on 10/04/2020.  These instructions were also sent to her MyChart  If her symptoms worsen while tapering prednisone she was advised to let us know immediately and she verbalized understanding  Continue close follow-up in clinic   Follow Up Instructions:    I discussed the assessment and treatment plan with the patient. The patient was provided an opportunity to ask questions and all were answered. The  patient agreed with the plan and demonstrated an understanding of the instructions.   The patient was advised to call back or seek an in-person evaluation if the symptoms worsen or if the condition fails to improve as anticipated.  I provided 15 minutes of non-face-to-face time during this encounter. Additional time was spent in reviewing patient's chart, placing orders etc.   Virgel Manifold, MD  Speech recognition software was used to dictate this note.

## 2020-10-02 ENCOUNTER — Ambulatory Visit: Payer: Medicaid Other | Admitting: Family Medicine

## 2020-10-02 ENCOUNTER — Encounter: Payer: Self-pay | Admitting: Family Medicine

## 2020-10-02 ENCOUNTER — Other Ambulatory Visit: Payer: Self-pay | Admitting: Gastroenterology

## 2020-10-02 NOTE — Telephone Encounter (Signed)
Is it okay to refill her Omeprazole? And for how much? Any refills?

## 2020-10-09 ENCOUNTER — Encounter: Payer: Self-pay | Admitting: Family Medicine

## 2020-10-09 NOTE — Telephone Encounter (Signed)
Pt's husband stated this is for him to be able to go with pt to apts and be able to drive her when she has apt's out of town due to her crohns disease and be able to be with her during panic attack episodes and   not get in trouble with job for missing to go to apts for her and when she is experiencing side affects from treat Pt's husband has a new phone number [phone].Pt;s request a phone call when this is done.

## 2020-10-09 NOTE — Telephone Encounter (Signed)
Called patient's specialty pharmacy to schedule patient's maintenance medications at [phone]. They stated that they would deliver her shipment by 10/16/2020. Patient was informed via MyChart since she is active.

## 2020-10-10 DIAGNOSIS — N6452 Nipple discharge: Secondary | ICD-10-CM | POA: Diagnosis not present

## 2020-10-10 DIAGNOSIS — N644 Mastodynia: Secondary | ICD-10-CM | POA: Diagnosis not present

## 2020-10-11 NOTE — Telephone Encounter (Signed)
Forms started. Placed in providers folder for completion and signature.

## 2020-10-12 NOTE — Telephone Encounter (Signed)
In your signature folder.

## 2020-10-16 DIAGNOSIS — K5080 Crohn's disease of both small and large intestine without complications: Secondary | ICD-10-CM | POA: Diagnosis not present

## 2020-10-17 LAB — COMPREHENSIVE METABOLIC PANEL
ALT: 16 IU/L (ref 0–32)
AST: 18 IU/L (ref 0–40)
Albumin/Globulin Ratio: 1.5 (ref 1.2–2.2)
Albumin: 4.4 g/dL (ref 3.8–4.8)
Alkaline Phosphatase: 72 IU/L (ref 44–121)
BUN/Creatinine Ratio: 6 — ABNORMAL LOW (ref 9–23)
BUN: 5 mg/dL — ABNORMAL LOW (ref 6–20)
Bilirubin Total: <0.2 mg/dL (ref 0.0–1.2)
CO2: 19 mmol/L — ABNORMAL LOW (ref 20–29)
Calcium: 9.2 mg/dL (ref 8.7–10.2)
Chloride: 107 mmol/L — ABNORMAL HIGH (ref 96–106)
Creatinine, Ser: 0.84 mg/dL (ref 0.57–1.00)
GFR calc Af Amer: 102 mL/min/{1.73_m2} (ref >59)
GFR calc non Af Amer: 88 mL/min/{1.73_m2} (ref >59)
Globulin, Total: 3 g/dL (ref 1.5–4.5)
Glucose: 83 mg/dL (ref 65–99)
Potassium: 4.4 mmol/L (ref 3.5–5.2)
Sodium: 139 mmol/L (ref 134–144)
Total Protein: 7.4 g/dL (ref 6.0–8.5)

## 2020-10-17 LAB — CBC
Hematocrit: 43.1 % (ref 34.0–46.6)
Hemoglobin: 13.9 g/dL (ref 11.1–15.9)
MCH: 30.2 pg (ref 26.6–33.0)
MCHC: 32.3 g/dL (ref 31.5–35.7)
MCV: 94 fL (ref 79–97)
Platelets: 371 10*3/uL (ref 150–450)
RBC: 4.61 x10E6/uL (ref 3.77–5.28)
RDW: 13.1 % (ref 11.7–15.4)
WBC: 7.7 10*3/uL (ref 3.4–10.8)

## 2020-10-17 LAB — C-REACTIVE PROTEIN: CRP: 11 mg/L — ABNORMAL HIGH (ref 0–10)

## 2020-10-22 ENCOUNTER — Telehealth: Payer: Self-pay

## 2020-10-22 NOTE — Telephone Encounter (Signed)
FMLA paperwork completed for the patient's husband. Called and LVM letting him know that it was ready to be picked up.

## 2020-10-23 ENCOUNTER — Other Ambulatory Visit: Payer: Self-pay | Admitting: Family Medicine

## 2020-10-23 MED ORDER — AMPHETAMINE-DEXTROAMPHET ER 15 MG PO CP24
15.0000 mg | ORAL_CAPSULE | ORAL | 0 refills | Status: DC
Start: 2020-10-23 — End: 2020-11-05

## 2020-10-23 NOTE — Telephone Encounter (Signed)
Please see patient's message

## 2020-10-24 DIAGNOSIS — K5080 Crohn's disease of both small and large intestine without complications: Secondary | ICD-10-CM | POA: Diagnosis not present

## 2020-10-26 ENCOUNTER — Encounter: Payer: Self-pay | Admitting: Family Medicine

## 2020-10-30 ENCOUNTER — Ambulatory Visit: Payer: Medicaid Other | Admitting: Family Medicine

## 2020-10-30 DIAGNOSIS — M47816 Spondylosis without myelopathy or radiculopathy, lumbar region: Secondary | ICD-10-CM | POA: Diagnosis not present

## 2020-10-30 LAB — CALPROTECTIN, FECAL: Calprotectin, Fecal: 89 ug/g (ref 0–120)

## 2020-10-31 ENCOUNTER — Other Ambulatory Visit: Payer: Self-pay

## 2020-10-31 ENCOUNTER — Telehealth (INDEPENDENT_AMBULATORY_CARE_PROVIDER_SITE_OTHER): Payer: Medicaid Other | Admitting: Gastroenterology

## 2020-10-31 DIAGNOSIS — K5080 Crohn's disease of both small and large intestine without complications: Secondary | ICD-10-CM

## 2020-10-31 MED ORDER — AZATHIOPRINE 50 MG PO TABS: 50.0000 mg | ORAL_TABLET | Freq: Every day | ORAL | 1 refills | Status: DC

## 2020-10-31 NOTE — Progress Notes (Signed)
[name], MD [address]  [place]  [place], [place] [address]  Main: [phone]  Fax: [phone]   Primary Care Physician: [name], DO  Virtual Visit via Telephone Note  I connected with patient on 10/31/20 at  2:15 PM EST by telephone and verified that I am speaking with the correct person using two identifiers.   I discussed the limitations, risks, security and privacy concerns of performing an evaluation and management service by telephone and the availability of in person appointments. I also discussed with the patient that there may be a patient responsible charge related to this service. The patient expressed understanding and agreed to proceed.  Location of Patient: Home Location of Provider: Home Persons involved: Patient and provider only during the visit (nursing staff and front desk staff was involved in communicating with the patient prior to the appointment, reviewing medications and checking them in)   History of Present Illness: Chief complaint: Crohn's follow-up  HPI: Cheryl Flowers is a 40 y.o. female here for follow-up of Crohn's ileocolitis.  Patient was supposed to see me in person in clinic today but changed this appointment herself to a virtual visit.  Patient has been compliant with Humira and Imuran and reports a bowel movement every day or every other day, with no blood in stool.  No abdominal pain.  No nausea or vomiting.  Good appetite.  No fever or chills.  No joint pain.  Humira start date: 09/21/2020 Imuran start date: 09/26/2020  Most recent fecal calprotectin has significantly improved from before  Previous history: Repeat colonoscopy October 2021 Impression:            - The examined portion of the ileum was normal.                         Biopsied.                        - Crohn's disease. Inflammation was found in the                         transverse colon, in the ascending colon and at the                          cecum. This was moderate in severity, worsened                         compared to previous examinations. Biopsied.                        - The distal rectum and anal verge are normal on                         retroflexion view.  Index colonoscopy May 2021 Findings:     - Nonbleeding ulcerated mucosa with no stigmata of recent bleeding were       present at the ileocecal valve.     - Terminal ileum could not be intubated, due to ulceration       present just proximal to the Ileocecal valve    - Patchy moderate inflammation characterized by altered vascularity,       erythema and shallow ulcerations was found in the cecum.     - Patchy mild inflammation  characterized by erythema and shallow       ulcerations was found in the transverse colon and in the ascending       colon.     - The descending colon appeared normal.    -  A patchy area of mildly erythematous mucosa was found in the rectum and       in the sigmoid colon.   There did not appear to be a stricture in the terminal ileum, but rather just inflammation, and therefore it was not intubated to avoid complications such as perforation.  Pathology:  A. COLON, CECUM; COLD BIOPSY:  - CHRONIC COLITIS WITH FOCAL MILD ACTIVITY.  - NEGATIVE FOR DYSPLASIA AND MALIGNANCY.   B. ILEOCECAL VALVE; COLD BIOPSY:  - CHRONIC ENTERITIS / COLITIS WITH MODERATE ACTIVITY AND ULCERATION.  - NEGATIVE FOR DYSPLASIA AND MALIGNANCY.   C. COLON, ASCENDING; COLD BIOPSY:  - CHRONIC COLITIS WITH MODERATE ACTIVITY.  - NEGATIVE FOR DYSPLASIA AND MALIGNANCY.   D. COLON, TRANSVERSE; COLD BIOPSY:  - CHRONIC COLITIS WITH MODERATE ACTIVITY.  - NEGATIVE FOR DYSPLASIA AND MALIGNANCY.   E. COLON, DESCENDING; COLD BIOPSY:  - CHRONIC INACTIVE COLITIS.  - NEGATIVE FOR DYSPLASIA AND MALIGNANCY.   F. COLON, RECTOSIGMOID; COLD BIOPSY:  - CHRONIC INACTIVE COLITIS.  - NEGATIVE FOR DYSPLASIA AND MALIGNANCY.   CDAI score was 120.  MRE was obtained  and showed mild wall thickening and mucosal enhancement involving the distal ileum and cecum.   Based on CDAI score and colonoscopy findings, patient was considered low risk and budesonide was initially started.  Due to endoscopic disease despite budesonide, therapy was changed  Gold QuantiFERON is negative.  TPMT is normal.  Patient is hep B immune based on blood work.  Current Outpatient Medications  Medication Sig Dispense Refill  . Adalimumab (HUMIRA PEN) 40 MG/0.4ML PNKT Inject 37m SQ every other week (Patient not taking: Reported on 09/26/2020) 6 each 0  . Adalimumab (HUMIRA PEN-CD/UC/HS STARTER) 80 MG/0.8ML PNKT Initation: Inject 1680mSQ on day 1 then 8084mn day 15. (Patient not taking: Reported on 09/26/2020) 3 each 0  . albuterol (VENTOLIN HFA) 108 (90 Base) MCG/ACT inhaler Inhale 1 puff into the lungs every 6 (six) hours as needed for wheezing or shortness of breath. 18 g 3  . amphetamine-dextroamphetamine (ADDERALL XR) 15 MG 24 hr capsule Take 1 capsule by mouth every morning. 30 capsule 0  . amphetamine-dextroamphetamine (ADDERALL XR) 15 MG 24 hr capsule Take 1 capsule by mouth every morning. 30 capsule 0  . amphetamine-dextroamphetamine (ADDERALL XR) 15 MG 24 hr capsule Take 1 capsule by mouth every morning for 13 days. 13 capsule 0  . DULoxetine (CYMBALTA) 60 MG capsule Take 1 capsule (60 mg total) by mouth daily. 90 capsule 1  . ergocalciferol (VITAMIN D2) 1.25 MG (50000 UT) capsule Take 1 capsule by mouth once a week.    . methocarbamol (ROBAXIN) 500 MG tablet Take 0.5-1 tablets (250-500 mg total) by mouth every 8 (eight) hours as needed for muscle spasms. 90 tablet 1  . metoprolol succinate (TOPROL-XL) 25 MG 24 hr tablet Take 1 tablet (25 mg total) by mouth daily. (Patient not taking: Reported on 09/26/2020) 90 tablet 0  . norethindrone (AYGESTIN) 5 MG tablet Take 1 tablet by mouth daily.    . oMarland Kitcheneprazole (PRILOSEC) 20 MG capsule TAKE 1 CAPSULE BY MOUTH EVERY DAY 30 capsule 0  .  QUEtiapine (SEROQUEL) 50 MG tablet Take 50 mg by mouth as needed.     No  current facility-administered medications for this visit.    Allergies as of 10/31/2020 - Review Complete 09/26/2020  Allergen Reaction Noted  . Morphine and related Itching 12/15/2015  . Sulfa antibiotics Hives 12/15/2015    Review of Systems:    All systems reviewed and negative except where noted in HPI.   Observations/Objective:  Labs: CMP     Component Value Date/Time   NA 139 10/16/2020 0905   K 4.4 10/16/2020 0905   CL 107 (H) 10/16/2020 0905   CO2 19 (L) 10/16/2020 0905   GLUCOSE 83 10/16/2020 0905   GLUCOSE 101 (H) 02/20/2018 0138   BUN 5 (L) 10/16/2020 0905   CREATININE 0.84 10/16/2020 0905   CALCIUM 9.2 10/16/2020 0905   PROT 7.4 10/16/2020 0905   ALBUMIN 4.4 10/16/2020 0905   AST 18 10/16/2020 0905   ALT 16 10/16/2020 0905   ALKPHOS 72 10/16/2020 0905   BILITOT <0.2 10/16/2020 0905   GFRNONAA 88 10/16/2020 0905   GFRAA 102 10/16/2020 0905   Lab Results  Component Value Date   WBC 7.7 10/16/2020   HGB 13.9 10/16/2020   HCT 43.1 10/16/2020   MCV 94 10/16/2020   PLT 371 10/16/2020    Imaging Studies: No results found.  Assessment and Plan:   Cheryl Flowers is a 40 y.o. y/o female here for follow-up of Crohn's ileocolitis  Assessment and Plan: Patient in clinical remission with Humira and Imuran  Fecal calprotectin improved as well  Repeat colonoscopy in 2 to 3 months  Dermatology referral for annual skin exam  Patient reports she follows up with GYN for regular Pap smears as well  Hep B series previously completed  Influenza up-to-date  Patient encouraged to read about and discuss COVID-vaccine with PCP since she has not received it yet  No extraintestinal manifestations at this time  We discussed symptoms with which to call us back and she verbalized understanding  Follow Up Instructions:    I discussed the assessment and treatment plan with the  patient. The patient was provided an opportunity to ask questions and all were answered. The patient agreed with the plan and demonstrated an understanding of the instructions.   The patient was advised to call back or seek an in-person evaluation if the symptoms worsen or if the condition fails to improve as anticipated.  I provided 15 minutes of non-face-to-face time during this encounter. Additional time was spent in reviewing patient's chart, placing orders etc.   Virgel Manifold, MD  Speech recognition software was used to dictate this note.

## 2020-10-31 NOTE — Addendum Note (Signed)
Addended by: Wayna Chalet on: 10/31/2020 03:17 PM   Modules accepted: Orders

## 2020-11-05 ENCOUNTER — Telehealth: Payer: Self-pay

## 2020-11-05 ENCOUNTER — Encounter: Payer: Self-pay | Admitting: Family Medicine

## 2020-11-05 ENCOUNTER — Telehealth (INDEPENDENT_AMBULATORY_CARE_PROVIDER_SITE_OTHER): Payer: Medicaid Other | Admitting: Family Medicine

## 2020-11-05 VITALS — BP 132/78 | HR 99 | Temp 98.3°F | Wt 199.0 lb

## 2020-11-05 DIAGNOSIS — F331 Major depressive disorder, recurrent, moderate: Secondary | ICD-10-CM | POA: Diagnosis not present

## 2020-11-05 DIAGNOSIS — F988 Other specified behavioral and emotional disorders with onset usually occurring in childhood and adolescence: Secondary | ICD-10-CM | POA: Diagnosis not present

## 2020-11-05 DIAGNOSIS — G4700 Insomnia, unspecified: Secondary | ICD-10-CM | POA: Diagnosis not present

## 2020-11-05 MED ORDER — AMPHETAMINE-DEXTROAMPHET ER 15 MG PO CP24: 15.0000 mg | ORAL_CAPSULE | ORAL | 0 refills | Status: DC

## 2020-11-05 MED ORDER — DULOXETINE HCL 60 MG PO CPEP
60.0000 mg | ORAL_CAPSULE | Freq: Every day | ORAL | 1 refills | Status: DC
Start: 2020-11-05 — End: 2021-04-22

## 2020-11-05 MED ORDER — QUETIAPINE FUMARATE 50 MG PO TABS
50.0000 mg | ORAL_TABLET | ORAL | 1 refills | Status: DC | PRN
Start: 2020-11-05 — End: 2021-04-22

## 2020-11-05 NOTE — Assessment & Plan Note (Signed)
Under good control on current regimen. Continue current regimen. Continue to monitor. Call with any concerns. Refills given.   

## 2020-11-05 NOTE — Assessment & Plan Note (Signed)
Under good control on current regimen. Continue current regimen. Continue to monitor. Call with any concerns. Refills given for 3 months. Follow up 3 months.

## 2020-11-05 NOTE — Telephone Encounter (Signed)
-----   Message from [name], DO sent at 11/05/2020  9:23 AM EST ----- 3 month follow up

## 2020-11-05 NOTE — Telephone Encounter (Signed)
Lvm 3 month f.u apt

## 2020-11-05 NOTE — Progress Notes (Signed)
BP 132/78   Pulse 99   Temp 98.3 F (36.8 C) (Oral)   Wt 199 lb (90.3 kg)   BMI 34.16 kg/m    Subjective:    Patient ID: Cheryl Flowers, female    DOB: 29-Jul-1981, 40 y.o.   MRN: [id number]  HPI: Cheryl Flowers is a 40 y.o. female  Chief Complaint  Patient presents with  . ADD  . Depression   ADHD FOLLOW UP ADHD status: controlled Satisfied with current therapy: yes Medication compliance:  excellent compliance Controlled substance contract: yes Previous psychiatry evaluation: no Previous medications: yes    Taking meds on weekends/vacations: yes Work/school performance:  good Difficulty sustaining attention/completing tasks: no Distracted by extraneous stimuli: no Does not listen when spoken to: no  Fidgets with hands or feet: no Unable to stay in seat: no Blurts out/interrupts others: no ADHD Medication Side Effects: no    Decreased appetite: no    Headache: no    Sleeping disturbance pattern: no    Irritability: no    Rebound effects (worse than baseline) off medication: no    Anxiousness: no    Dizziness: no    Tics: no  DEPRESSION Mood status: better Satisfied with current treatment?: yes Symptom severity: mild  Duration of current treatment : chronic Side effects: no Medication compliance: excellent compliance Psychotherapy/counseling: no  Previous psychiatric medications: cymbalta Depressed mood: no Anxious mood: no Anhedonia: no Significant weight loss or gain: no Insomnia: no  Fatigue: no Feelings of worthlessness or guilt: no Impaired concentration/indecisiveness: no Suicidal ideations: no Hopelessness: no Crying spells: no Depression screen [facility] 2/9 11/05/2020 04/05/2020 02/29/2020 01/17/2020 01/17/2020  Decreased Interest 0 1 3 1 1   Down, Depressed, Hopeless 1 1 3  0 0  PHQ - 2 Score 1 2 6 1 1   Altered sleeping 1 2 2 2 2   Tired, decreased energy 0 2 2 2 2   Change in appetite 0 1 2 0 0  Feeling bad or failure about  yourself  0 0 3 0 0  Trouble concentrating 1 2 2 3 3   Moving slowly or fidgety/restless 0 0 1 0 0  Suicidal thoughts 0 0 2 0 0  PHQ-9 Score 3 9 20 8 8   Difficult doing work/chores Not difficult at all Very difficult Very difficult - Somewhat difficult  Some recent data might be hidden     Relevant past medical, surgical, family and social history reviewed and updated as indicated. Interim medical history since our last visit reviewed. Allergies and medications reviewed and updated.  Review of Systems  Constitutional: Negative.   Respiratory: Negative.   Cardiovascular: Negative.   Gastrointestinal: Negative.   Musculoskeletal: Negative.   Skin: Negative.   Neurological: Negative.   Psychiatric/Behavioral: Positive for sleep disturbance. Negative for agitation, behavioral problems, confusion, decreased concentration, dysphoric mood, hallucinations, self-injury and suicidal ideas. The patient is not nervous/anxious and is not hyperactive.     Per HPI unless specifically indicated above     Objective:    BP 132/78   Pulse 99   Temp 98.3 F (36.8 C) (Oral)   Wt 199 lb (90.3 kg)   BMI 34.16 kg/m   Wt Readings from Last 3 Encounters:  11/05/20 199 lb (90.3 kg)  07/27/20 207 lb (93.9 kg)  07/03/20 205 lb 3.2 oz (93.1 kg)    Physical Exam Vitals and nursing note reviewed.  Pulmonary:     Effort: Pulmonary effort is normal. No respiratory distress.     Comments: Speaking  in full sentences Neurological:     Mental Status: She is alert.  Psychiatric:        Mood and Affect: Mood normal.        Behavior: Behavior normal.        Thought Content: Thought content normal.        Judgment: Judgment normal.     Results for orders placed or performed in visit on 09/26/20  CBC  Result Value Ref Range   WBC 7.7 3.4 - 10.8 x10E3/uL   RBC 4.61 3.77 - 5.28 x10E6/uL   Hemoglobin 13.9 11.1 - 15.9 g/dL   Hematocrit 43.1 34.0 - 46.6 %   MCV 94 79 - 97 fL   MCH 30.2 26.6 - 33.0 pg    MCHC 32.3 31.5 - 35.7 g/dL   RDW 13.1 11.7 - 15.4 %   Platelets 371 150 - 450 x10E3/uL  Comprehensive metabolic panel  Result Value Ref Range   Glucose 83 65 - 99 mg/dL   BUN 5 (L) 6 - 20 mg/dL   Creatinine, Ser 0.84 0.57 - 1.00 mg/dL   GFR calc non Af Amer 88 >59 mL/min/1.73   GFR calc Af Amer 102 >59 mL/min/1.73   BUN/Creatinine Ratio 6 (L) 9 - 23   Sodium 139 134 - 144 mmol/L   Potassium 4.4 3.5 - 5.2 mmol/L   Chloride 107 (H) 96 - 106 mmol/L   CO2 19 (L) 20 - 29 mmol/L   Calcium 9.2 8.7 - 10.2 mg/dL   Total Protein 7.4 6.0 - 8.5 g/dL   Albumin 4.4 3.8 - 4.8 g/dL   Globulin, Total 3.0 1.5 - 4.5 g/dL   Albumin/Globulin Ratio 1.5 1.2 - 2.2   Bilirubin Total <0.2 0.0 - 1.2 mg/dL   Alkaline Phosphatase 72 44 - 121 IU/L   AST 18 0 - 40 IU/L   ALT 16 0 - 32 IU/L  C-reactive protein  Result Value Ref Range   CRP 11 (H) 0 - 10 mg/L      Assessment & Plan:   Problem List Items Addressed This Visit      Other   Attention deficit disorder (ADD) in adult - Primary    Under good control on current regimen. Continue current regimen. Continue to monitor. Call with any concerns. Refills given for 3 months. Follow up 3 months.        Moderate episode of recurrent major depressive disorder (HCC)    Under good control on current regimen. Continue current regimen. Continue to monitor. Call with any concerns. Refills given.        Relevant Medications   DULoxetine (CYMBALTA) 60 MG capsule    Other Visit Diagnoses    Insomnia, unspecified type       Not doing great. Will increaase her seroquel to 1-2 tabs PRN. Call with any concerns or if not getting better.        Follow up plan: Return in about 3 months (around 02/03/2021).    . This visit was completed via telephone due to the restrictions of the COVID-19 pandemic. All issues as above were discussed and addressed but no physical exam was performed. If it was felt that the patient should be evaluated in the office, they  were directed there. The patient verbally consented to this visit. Patient was unable to complete an audio/visual visit due to Technical difficulties. Due to the catastrophic nature of the COVID-19 pandemic, this visit was done through audio contact only. . Location of the  patient: home . Location of the provider: home . Those involved with this call:  . Provider: Park Liter, DO . CMA: Louanna Raw, [place] . Front Desk/Registration: Jill Side  . Time spent on call: 21 minutes on the phone discussing health concerns. 30 minutes total spent in review of patient's record and preparation of their chart.

## 2020-11-06 ENCOUNTER — Telehealth: Payer: Self-pay

## 2020-11-06 NOTE — Telephone Encounter (Signed)
PA for Seroquel submitted and approved via Cover My Meds. Key: [id number]

## 2020-11-14 DIAGNOSIS — N6452 Nipple discharge: Secondary | ICD-10-CM | POA: Diagnosis not present

## 2020-11-14 DIAGNOSIS — N644 Mastodynia: Secondary | ICD-10-CM | POA: Diagnosis not present

## 2020-11-14 DIAGNOSIS — R922 Inconclusive mammogram: Secondary | ICD-10-CM | POA: Diagnosis not present

## 2020-11-22 ENCOUNTER — Encounter: Payer: Self-pay | Admitting: Unknown Physician Specialty

## 2020-11-22 ENCOUNTER — Telehealth (INDEPENDENT_AMBULATORY_CARE_PROVIDER_SITE_OTHER): Payer: Medicaid Other | Admitting: Unknown Physician Specialty

## 2020-11-22 ENCOUNTER — Encounter: Payer: Self-pay | Admitting: Family Medicine

## 2020-11-22 VITALS — BP 137/77 | HR 100 | Temp 99.1°F | Wt 205.0 lb

## 2020-11-22 DIAGNOSIS — B349 Viral infection, unspecified: Secondary | ICD-10-CM | POA: Diagnosis not present

## 2020-11-22 NOTE — Progress Notes (Signed)
BP 137/77   Pulse 100   Temp 99.1 F (37.3 C)   Wt 205 lb (93 kg)   BMI 35.19 kg/m    Subjective:    Patient ID: Cheryl Flowers, female    DOB: 1980-12-30, 40 y.o.   MRN: [id number]  HPI: Cheryl Flowers is a 40 y.o. female  Chief Complaint  Patient presents with  . Diarrhea  . Headache       . Joint Pain    Started Tuesday afternoon   This visit was completed via telephone due to the restrictions of the COVID-19 pandemic. All issues as above were discussed and addressed but no physical exam was performed. If it was felt that the patient should be evaluated in the office, they were directed there. The patient verbally consented to this visit. Patient was unable to complete an audio/visual visit due to Technical difficulties,Lack of internet. . Location of the patient: home . Location of the provider: work . Those involved with this call:  . Provider: Kathrine Haddock, DNP . CMA: Tiffany Reel, CMA . Front Desk/Registration: Jill Side  . Time spent on call: 15 minutes on the phone discussing health concerns. 5 minutes total spent in review of patient's record and preparation of their chart.  I verified patient identity using two factors (patient name and date of birth). Patient consents verbally to being seen via telemedicine visit today.   URI  This is a new problem. Episode onset: 2 days. The problem has been gradually worsening. Maximum temperature: chills. Associated symptoms include diarrhea and joint pain. Pertinent negatives include no abdominal pain, chest pain, congestion, coughing, dysuria, ear pain, headaches, joint swelling, nausea, neck pain, plugged ear sensation, rash, rhinorrhea, sinus pain, sneezing, sore throat, swollen glands, vomiting or wheezing. She has tried nothing for the symptoms.   Unvaccinated for Covid.  Initial rapid Covid negative at onset of symptoms  Relevant past medical, surgical, family and social history reviewed and  updated as indicated. Interim medical history since our last visit reviewed. Allergies and medications reviewed and updated.  Review of Systems  HENT: Negative for congestion, ear pain, rhinorrhea, sinus pain, sneezing and sore throat.   Respiratory: Negative for cough and wheezing.   Cardiovascular: Negative for chest pain.  Gastrointestinal: Positive for diarrhea. Negative for abdominal pain, nausea and vomiting.  Genitourinary: Negative for dysuria.  Musculoskeletal: Positive for joint pain. Negative for neck pain.  Skin: Negative for rash.  Neurological: Negative for headaches.    Per HPI unless specifically indicated above     Objective:    BP 137/77   Pulse 100   Temp 99.1 F (37.3 C)   Wt 205 lb (93 kg)   BMI 35.19 kg/m   Wt Readings from Last 3 Encounters:  11/22/20 205 lb (93 kg)  11/05/20 199 lb (90.3 kg)  07/27/20 207 lb (93.9 kg)    Physical Exam Neurological:     Mental Status: She is alert.  Psychiatric:        Mood and Affect: Mood normal.        Behavior: Behavior normal.     Results for orders placed or performed in visit on 09/26/20  CBC  Result Value Ref Range   WBC 7.7 3.4 - 10.8 x10E3/uL   RBC 4.61 3.77 - 5.28 x10E6/uL   Hemoglobin 13.9 11.1 - 15.9 g/dL   Hematocrit 43.1 34.0 - 46.6 %   MCV 94 79 - 97 fL   MCH 30.2 26.6 - 33.0  pg   MCHC 32.3 31.5 - 35.7 g/dL   RDW 13.1 11.7 - 15.4 %   Platelets 371 150 - 450 x10E3/uL  Comprehensive metabolic panel  Result Value Ref Range   Glucose 83 65 - 99 mg/dL   BUN 5 (L) 6 - 20 mg/dL   Creatinine, Ser 0.84 0.57 - 1.00 mg/dL   GFR calc non Af Amer 88 >59 mL/min/1.73   GFR calc Af Amer 102 >59 mL/min/1.73   BUN/Creatinine Ratio 6 (L) 9 - 23   Sodium 139 134 - 144 mmol/L   Potassium 4.4 3.5 - 5.2 mmol/L   Chloride 107 (H) 96 - 106 mmol/L   CO2 19 (L) 20 - 29 mmol/L   Calcium 9.2 8.7 - 10.2 mg/dL   Total Protein 7.4 6.0 - 8.5 g/dL   Albumin 4.4 3.8 - 4.8 g/dL   Globulin, Total 3.0 1.5 - 4.5 g/dL    Albumin/Globulin Ratio 1.5 1.2 - 2.2   Bilirubin Total <0.2 0.0 - 1.2 mg/dL   Alkaline Phosphatase 72 44 - 121 IU/L   AST 18 0 - 40 IU/L   ALT 16 0 - 32 IU/L  C-reactive protein  Result Value Ref Range   CRP 11 (H) 0 - 10 mg/L      Assessment & Plan:   Problem List Items Addressed This Visit   None   Visit Diagnoses    Viral illness    -  Primary   suspect Covid despite negative rapid test -. Will take another.  Vit C, Vit D, Quecetin, Melatonin, Zinc. PCR tom. If 2nd rapid or prc + refer to covid clinic        Follow up plan: Tomorrow for testing and Covid tx center asap

## 2020-11-23 ENCOUNTER — Encounter: Payer: Self-pay | Admitting: Unknown Physician Specialty

## 2020-11-28 ENCOUNTER — Telehealth: Payer: Self-pay

## 2020-11-28 NOTE — Telephone Encounter (Signed)
Called patient and left her a voicemail letting her know what she is needing to be seen today or tomorrow.

## 2020-12-01 ENCOUNTER — Other Ambulatory Visit: Payer: Self-pay | Admitting: Gastroenterology

## 2020-12-11 ENCOUNTER — Other Ambulatory Visit: Payer: Self-pay | Admitting: Gastroenterology

## 2020-12-11 NOTE — Telephone Encounter (Signed)
Last office visit 10/31/2020 crohns disease  Last refill 09/13/2020 0 refills

## 2020-12-18 ENCOUNTER — Ambulatory Visit: Payer: Medicaid Other | Admitting: Gastroenterology

## 2020-12-21 DIAGNOSIS — M6530 Trigger finger, unspecified finger: Secondary | ICD-10-CM | POA: Diagnosis not present

## 2020-12-21 DIAGNOSIS — R569 Unspecified convulsions: Secondary | ICD-10-CM | POA: Diagnosis not present

## 2020-12-21 DIAGNOSIS — G43019 Migraine without aura, intractable, without status migrainosus: Secondary | ICD-10-CM | POA: Diagnosis not present

## 2021-01-05 DIAGNOSIS — R569 Unspecified convulsions: Secondary | ICD-10-CM | POA: Diagnosis not present

## 2021-01-07 DIAGNOSIS — M65312 Trigger thumb, left thumb: Secondary | ICD-10-CM | POA: Diagnosis not present

## 2021-01-09 ENCOUNTER — Emergency Department
Admission: EM | Admit: 2021-01-09 | Discharge: 2021-01-10 | Disposition: A | Discharge status: Home / Self Care | Payer: Medicaid Other | Attending: Emergency Medicine | Admitting: Emergency Medicine

## 2021-01-09 ENCOUNTER — Emergency Department: Payer: Medicaid Other

## 2021-01-09 ENCOUNTER — Encounter: Payer: Self-pay | Admitting: Emergency Medicine

## 2021-01-09 ENCOUNTER — Other Ambulatory Visit: Payer: Self-pay

## 2021-01-09 DIAGNOSIS — R000 Tachycardia, unspecified: Secondary | ICD-10-CM | POA: Diagnosis not present

## 2021-01-09 DIAGNOSIS — Z79899 Other long term (current) drug therapy: Secondary | ICD-10-CM | POA: Insufficient documentation

## 2021-01-09 DIAGNOSIS — I10 Essential (primary) hypertension: Secondary | ICD-10-CM | POA: Diagnosis not present

## 2021-01-09 DIAGNOSIS — Z20822 Contact with and (suspected) exposure to covid-19: Secondary | ICD-10-CM | POA: Insufficient documentation

## 2021-01-09 DIAGNOSIS — R059 Cough, unspecified: Secondary | ICD-10-CM | POA: Diagnosis not present

## 2021-01-09 DIAGNOSIS — J189 Pneumonia, unspecified organism: Secondary | ICD-10-CM | POA: Diagnosis not present

## 2021-01-09 DIAGNOSIS — R509 Fever, unspecified: Secondary | ICD-10-CM | POA: Diagnosis not present

## 2021-01-09 DIAGNOSIS — R0602 Shortness of breath: Secondary | ICD-10-CM | POA: Diagnosis not present

## 2021-01-09 DIAGNOSIS — Z87891 Personal history of nicotine dependence: Secondary | ICD-10-CM | POA: Diagnosis not present

## 2021-01-09 DIAGNOSIS — I2699 Other pulmonary embolism without acute cor pulmonale: Secondary | ICD-10-CM | POA: Diagnosis not present

## 2021-01-09 DIAGNOSIS — R079 Chest pain, unspecified: Secondary | ICD-10-CM | POA: Diagnosis not present

## 2021-01-09 LAB — URINALYSIS, COMPLETE (UACMP) WITH MICROSCOPIC
Bacteria, UA: NONE SEEN
Bilirubin Urine: NEGATIVE
Glucose, UA: NEGATIVE mg/dL
Ketones, ur: NEGATIVE mg/dL
Leukocytes,Ua: NEGATIVE
Nitrite: NEGATIVE
Protein, ur: NEGATIVE mg/dL
Specific Gravity, Urine: 1.02 (ref 1.005–1.030)
pH: 7 (ref 5.0–8.0)

## 2021-01-09 LAB — COMPREHENSIVE METABOLIC PANEL
ALT: 14 U/L (ref 0–44)
AST: 16 U/L (ref 15–41)
Albumin: 3.7 g/dL (ref 3.5–5.0)
Alkaline Phosphatase: 61 U/L (ref 38–126)
Anion gap: 8 (ref 5–15)
BUN: 6 mg/dL (ref 6–20)
CO2: 18 mmol/L — ABNORMAL LOW (ref 22–32)
Calcium: 8.8 mg/dL — ABNORMAL LOW (ref 8.9–10.3)
Chloride: 109 mmol/L (ref 98–111)
Creatinine, Ser: 0.74 mg/dL (ref 0.44–1.00)
GFR, Estimated: >60 mL/min (ref >60)
Glucose, Bld: 120 mg/dL — ABNORMAL HIGH (ref 70–99)
Potassium: 3.3 mmol/L — ABNORMAL LOW (ref 3.5–5.1)
Sodium: 135 mmol/L (ref 135–145)
Total Bilirubin: 0.6 mg/dL (ref 0.3–1.2)
Total Protein: 7.8 g/dL (ref 6.5–8.1)

## 2021-01-09 LAB — CBC WITH DIFFERENTIAL/PLATELET
Abs Immature Granulocytes: 0.03 10*3/uL (ref 0.00–0.07)
Basophils Absolute: 0.1 10*3/uL (ref 0.0–0.1)
Basophils Relative: 1 %
Eosinophils Absolute: 0 10*3/uL (ref 0.0–0.5)
Eosinophils Relative: 0 %
HCT: 39.1 % (ref 36.0–46.0)
Hemoglobin: 13.3 g/dL (ref 12.0–15.0)
Immature Granulocytes: 0 %
Lymphocytes Relative: 21 %
Lymphs Abs: 3 10*3/uL (ref 0.7–4.0)
MCH: 31.7 pg (ref 26.0–34.0)
MCHC: 34 g/dL (ref 30.0–36.0)
MCV: 93.1 fL (ref 80.0–100.0)
Monocytes Absolute: 1 10*3/uL (ref 0.1–1.0)
Monocytes Relative: 7 %
Neutro Abs: 10.3 10*3/uL — ABNORMAL HIGH (ref 1.7–7.7)
Neutrophils Relative %: 71 %
Platelets: 299 10*3/uL (ref 150–400)
RBC: 4.2 MIL/uL (ref 3.87–5.11)
RDW: 14.6 % (ref 11.5–15.5)
WBC: 14.4 10*3/uL — ABNORMAL HIGH (ref 4.0–10.5)
nRBC: 0 % (ref 0.0–0.2)

## 2021-01-09 LAB — POC URINE PREG, ED: Preg Test, Ur: NEGATIVE

## 2021-01-09 LAB — TROPONIN I (HIGH SENSITIVITY): Troponin I (High Sensitivity): 4 ng/L (ref <18)

## 2021-01-09 LAB — LACTIC ACID, PLASMA: Lactic Acid, Venous: 1.2 mmol/L (ref 0.5–1.9)

## 2021-01-09 MED ORDER — SODIUM CHLORIDE 0.9 % IV BOLUS (SEPSIS)
1000.0000 mL | Freq: Once | INTRAVENOUS | Status: AC
  Administered 2021-01-09: 1000 mL via INTRAVENOUS

## 2021-01-09 MED ORDER — IBUPROFEN 800 MG PO TABS
800.0000 mg | ORAL_TABLET | Freq: Once | ORAL | Status: AC
  Administered 2021-01-09: 800 mg via ORAL
  Filled 2021-01-09: qty 1

## 2021-01-09 MED ORDER — ACETAMINOPHEN 500 MG PO TABS
1000.0000 mg | ORAL_TABLET | Freq: Once | ORAL | Status: AC
  Administered 2021-01-09: 1000 mg via ORAL
  Filled 2021-01-09: qty 2

## 2021-01-09 NOTE — ED Notes (Signed)
Patient provided with cup for urine sample and instructed on how to obtain clean catch.

## 2021-01-09 NOTE — ED Provider Notes (Signed)
[facility] Emergency Department Provider Note  ____________________________________________   Event Date/Time   First MD Initiated Contact with Patient 01/09/21 2313     (approximate)  I have reviewed the triage vital signs and the nursing notes.   HISTORY  Chief Complaint Chest Pain and Fever    HPI Cheryl Flowers is a 40 y.o. female with history of anxiety, Crohn's who presents to the emergency department with complaints of fevers, headache, body aches, central chest pain that radiates into her back, shortness of breath, diarrhea that started today.  States she has been fatigued for 3 days.  No known sick contacts.  No nausea or vomiting.  No urinary symptoms.  No rash.  She is vaccinated for influenza but has not been vaccinated against COVID-19.        Past Medical History:  Diagnosis Date  . Anxiety   . Depression   . Low back pain   . Marital conflict   . Panic disorder   . Thyromegaly     Patient Active Problem List   Diagnosis Date Noted  . Crohn's disease of both small and large intestine without complication ([place])   . Crohn's disease of colon without complication ([place]) 86/76/7209  . Osteoarthritis of lower back 03/15/2020  . Positive FIT (fecal immunochemical test)   . Inflammation of colonic mucosa   . Hematochezia 12/21/2019  . Disorder of SI (sacroiliac) joint 05/23/2019  . False positive ana 05/23/2019  . Abnormal CT of the chest 04/21/2019  . Chronic daily headache 04/21/2019  . Positive ANA (antinuclear antibody) 04/21/2019  . Moderate episode of recurrent major depressive disorder ([place]) 04/06/2019  . Tobacco abuse 05/12/2018  . Essential hypertension 03/01/2018  . Attention deficit disorder (ADD) in adult 03/01/2018  . Controlled substance agreement signed 03/01/2018  . Syncope 02/20/2018  . Morbid obesity ([place]) 02/01/2018  . Generalized anxiety disorder with panic attacks 11/02/2017  . Thyromegaly 11/02/2017   . Low back pain with right-sided sciatica 06/06/2017  . Marital conflict 47/06/6282  . Back pain due to injury 03/29/2011    Past Surgical History:  Procedure Laterality Date  . CESAREAN SECTION  2003 and 2010  . CHOLECYSTECTOMY  2007  . COLONOSCOPY WITH PROPOFOL N/A 03/07/2020   Procedure: COLONOSCOPY WITH PROPOFOL;  Surgeon: Virgel Manifold, MD;  Location: [facility] ENDOSCOPY;  Service: Endoscopy;  Laterality: N/A;  . COLONOSCOPY WITH PROPOFOL N/A 07/27/2020   Procedure: COLONOSCOPY WITH PROPOFOL;  Surgeon: Virgel Manifold, MD;  Location: [facility] ENDOSCOPY;  Service: Endoscopy;  Laterality: N/A;  . TUBAL LIGATION  2010    Prior to Admission medications   Medication Sig Start Date End Date Taking? Authorizing Provider  azithromycin (ZITHROMAX Z-PAK) 250 MG tablet Take 2 tablets (500 mg) on  Day 1,  followed by 1 tablet (250 mg) once daily on Days 2 through 5. 01/10/21 01/15/21 Yes Monna Crean, Delice Bison, DO  cefpodoxime (VANTIN) 200 MG tablet Take 1 tablet (200 mg total) by mouth 2 (two) times daily. 01/10/21  Yes Aleanna Menge, Cyril Mourning N, DO  Adalimumab (HUMIRA PEN) 40 MG/0.4ML PNKT INJECT 1 PEN UNDER THE SKIN EVERY 14 DAYS. 12/12/20   Virgel Manifold, MD  Adalimumab (HUMIRA PEN-CD/UC/HS STARTER) 80 MG/0.8ML PNKT Initation: Inject 11m SQ on day 1 then 855mon day 15. 09/13/20   TaVirgel ManifoldMD  albuterol (VENTOLIN HFA) 108 (90 Base) MCG/ACT inhaler Inhale 1 puff into the lungs every 6 (six) hours as needed for wheezing or shortness of  breath. 07/03/20   Johnson, Megan P, DO  amphetamine-dextroamphetamine (ADDERALL XR) 15 MG 24 hr capsule Take 1 capsule by mouth every morning. 11/05/20 12/05/20  Park Liter P, DO  amphetamine-dextroamphetamine (ADDERALL XR) 15 MG 24 hr capsule Take 1 capsule by mouth every morning. 12/05/20 01/04/21  Park Liter P, DO  amphetamine-dextroamphetamine (ADDERALL XR) 15 MG 24 hr capsule Take 1 capsule by mouth every morning. 01/04/21 02/03/21  Park Liter P, DO   azaTHIOprine (IMURAN) 50 MG tablet Take 1 tablet (50 mg total) by mouth daily. 10/31/20   Virgel Manifold, MD  DULoxetine (CYMBALTA) 60 MG capsule Take 1 capsule (60 mg total) by mouth daily. 11/05/20   Johnson, Megan P, DO  ergocalciferol (VITAMIN D2) 1.25 MG (50000 UT) capsule Take 1 capsule by mouth once a week. Patient not taking: Reported on 11/05/2020 05/09/20   [provider]  methocarbamol (ROBAXIN) 500 MG tablet Take 0.5-1 tablets (250-500 mg total) by mouth every 8 (eight) hours as needed for muscle spasms. 07/03/20   Johnson, Megan P, DO  metoprolol succinate (TOPROL-XL) 25 MG 24 hr tablet Take 1 tablet (25 mg total) by mouth daily. Patient not taking: No sig reported 07/03/20   Park Liter P, DO  norethindrone (AYGESTIN) 5 MG tablet Take 1 tablet by mouth daily. 06/04/20 06/04/21  [provider]  omeprazole (PRILOSEC) 20 MG capsule TAKE 1 CAPSULE BY MOUTH EVERY DAY 12/06/20   [name] B, MD  QUEtiapine (SEROQUEL) 50 MG tablet Take 1-2 tablets (50-100 mg total) by mouth as needed. 11/05/20   Park Liter P, DO    Allergies Morphine and related and Sulfa antibiotics  Family History  Problem Relation Age of Onset  . Hypertension Mother   . Rheum arthritis Mother   . Diabetes Father   . Breast cancer Maternal Aunt        early 64  . Crohn's disease Maternal Aunt     Social History Social History   Tobacco Use  . Smoking status: Former Smoker    Packs/day: 0.25    Years: 19.00    Pack years: 4.75    Types: Cigarettes    Quit date: 09/18/2019    Years since quitting: 1.3  . Smokeless tobacco: Never Used  Vaping Use  . Vaping Use: Former  Substance Use Topics  . Alcohol use: Not Currently  . Drug use: No    Review of Systems Constitutional: + fever. Eyes: No visual changes. ENT: No sore throat. Cardiovascular: + chest pain. Respiratory: + shortness of breath. Gastrointestinal: No nausea, vomiting.  + diarrhea. Genitourinary: Negative  for dysuria. Musculoskeletal: Negative for back pain. Skin: Negative for rash. Neurological: Negative for focal weakness or numbness.  ____________________________________________   PHYSICAL EXAM:  VITAL SIGNS: ED Triage Vitals  Enc Vitals Group     BP 01/09/21 2024 (!) 155/100     Pulse Rate 01/09/21 2024 (!) 135     Resp 01/09/21 2024 (!) 22     Temp 01/09/21 2024 (!) 101.6 F (38.7 C)     Temp Source 01/09/21 2024 Oral     SpO2 01/09/21 2024 100 %     Weight 01/09/21 2025 208 lb (94.3 kg)     Height 01/09/21 2025 5' 4.5" (1.638 m)     Head Circumference --      Peak Flow --      Pain Score 01/09/21 2025 7     Pain Loc --      Pain Edu? --  Excl. in GC? --    CONSTITUTIONAL: Alert and oriented and responds appropriately to questions. Well-appearing; well-nourished, nontoxic-appearing HEAD: Normocephalic EYES: Conjunctivae clear, pupils appear equal, EOM appear intact ENT: normal nose; moist mucous membranes NECK: Supple, normal ROM CARD: Regular and tachycardic; S1 and S2 appreciated; no murmurs, no clicks, no rubs, no gallops RESP: Normal chest excursion without splinting or tachypnea; breath sounds clear and equal bilaterally; no wheezes, no rhonchi, no rales, no hypoxia or respiratory distress, speaking full sentences ABD/GI: Normal bowel sounds; non-distended; soft, non-tender, no rebound, no guarding, no peritoneal signs, no hepatosplenomegaly BACK: The back appears normal EXT: Normal ROM in all joints; no deformity noted, no edema; no cyanosis, no calf tenderness or swelling SKIN: Normal color for age and race; warm; no rash on exposed skin NEURO: Moves all extremities equally PSYCH: The patient's mood and manner are appropriate.  ____________________________________________   LABS (all labs ordered are listed, but only abnormal results are displayed)  Labs Reviewed  COMPREHENSIVE METABOLIC PANEL - Abnormal; Notable for the following components:       Result Value   Potassium 3.3 (*)    CO2 18 (*)    Glucose, Bld 120 (*)    Calcium 8.8 (*)    All other components within normal limits  CBC WITH DIFFERENTIAL/PLATELET - Abnormal; Notable for the following components:   WBC 14.4 (*)    Neutro Abs 10.3 (*)    All other components within normal limits  URINALYSIS, COMPLETE (UACMP) WITH MICROSCOPIC - Abnormal; Notable for the following components:   Color, Urine YELLOW (*)    APPearance HAZY (*)    Hgb urine dipstick MODERATE (*)    All other components within normal limits  RESP PANEL BY RT-PCR (FLU A&B, COVID) ARPGX2  CULTURE, BLOOD (ROUTINE X 2)  CULTURE, BLOOD (ROUTINE X 2)  LACTIC ACID, PLASMA  POC URINE PREG, ED  TROPONIN I (HIGH SENSITIVITY)   ____________________________________________  EKG   EKG Interpretation  Date/Time:  Wednesday January 09 2021 20:27:57 EDT Ventricular Rate:  121 PR Interval:  128 QRS Duration: 70 QT Interval:  296 QTC Calculation: 420 R Axis:   60 Text Interpretation: Sinus tachycardia Right atrial enlargement Borderline ECG No significant change since last tracing Confirmed by Pryor Curia [phone]) on 01/09/2021 11:54:54 PM       ____________________________________________  RADIOLOGY Jessie Foot Madilyn Cephas, personally viewed and evaluated these images (plain radiographs) as part of my medical decision making, as well as reviewing the written report by the radiologist.  ED MD interpretation:  R sided PNA  Official radiology report(s): DG Chest 2 View  Result Date: 01/09/2021 CLINICAL DATA:  Fever and cough EXAM: CHEST - 2 VIEW COMPARISON:  02/20/2018 FINDINGS: Left lung is clear. Ovoid right suprahilar opacity. Normal heart size. No pneumothorax. IMPRESSION: Ovoid right suprahilar opacity, possible focal area of pneumonia. Suggest short interval radiographic follow-up. Electronically Signed   By: Donavan Foil M.D.   On: 01/09/2021 21:19   CT Angio Chest PE W and/or Wo Contrast  Result Date:  01/10/2021 CLINICAL DATA:  Pulmonary embolism suspected. Chest pain with shortness of breath and dry cough EXAM: CT ANGIOGRAPHY CHEST WITH CONTRAST TECHNIQUE: Multidetector CT imaging of the chest was performed using the standard protocol during bolus administration of intravenous contrast. Multiplanar CT image reconstructions and MIPs were obtained to evaluate the vascular anatomy. CONTRAST:  47m OMNIPAQUE IOHEXOL 350 MG/ML SOLN COMPARISON:  09/13/2019 chest CT FINDINGS: Cardiovascular: Satisfactory opacification of the pulmonary arteries to the segmental  level. No evidence of pulmonary embolism. Normal heart size. No pericardial effusion. Mediastinum/Nodes: Enlarged right hilar lymph nodes considered reactive to the pulmonary disease, 17 mm in maximal diameter. Low-density thymus is visible and has a stable shape compared to prior. Lungs/Pleura: Rounded airspace opacity in the right upper lobe. No cavitation, edema, or effusion Upper Abdomen: Cholecystectomy. Musculoskeletal: Negative Review of the MIP images confirms the above findings. IMPRESSION: 1. Airspace disease in the right upper lobe with right hilar adenopathy - most likely pneumonia. Followup PA and lateral chest X-ray is recommended in 3-4 weeks following trial of antibiotic therapy to ensure resolution. 2. Negative for pulmonary embolism. Electronically Signed   By: Monte Fantasia M.D.   On: 01/10/2021 04:30    ____________________________________________   PROCEDURES  Procedure(s) performed (including Critical Care):  Procedures    ____________________________________________   INITIAL IMPRESSION / ASSESSMENT AND PLAN / ED COURSE  As part of my medical decision making, I reviewed the following data within the [place] notes reviewed and incorporated, Labs reviewed , EKG interpreted , Old EKG reviewed, Old chart reviewed, Radiograph reviewed  and Notes from prior ED visits         Patient here with  fever, chest pain, shortness of breath.  No significant cough.  She is also had some diarrhea and body aches.  Well-appearing, nontoxic here.  She is febrile and tachycardic but does not appear septic.  Normotensive.  Labs show leukocytosis of 14,000 with left shift.  Lactate normal.  Chest x-ray concerning for right-sided pneumonia.  Will obtain Covid, influenza swabs to determine if she needs to be on antibiotics or if this is viral.  Will give Tylenol, IV fluids for continued symptomatic relief.  EKG is nonischemic.  Doubt ACS, PE, dissection.  ED PROGRESS  Patient's COVID and flu swabs are negative.  Will ambulate with pulse oximeter.  Vital signs have improved with antipyretics and fluids.  Will treat for community-acquired pneumonia with Rocephin and azithromycin.  Given she does meet SIRS criteria with elevated heart rate, fever and leukocytosis, will send blood cultures but again given how well-appearing, nontoxic she is, anticipate that she will do well with outpatient management as long as she is not hypoxic.  2:05 AM  Pt had no desaturation with ambulation but felt short of breath and her heart rate into the 130s.  Will obtain CT of the chest to rule out PE.  Will give another liter of IV fluids.  4:50 AM  Pt's CT shows no PE.  Redemonstrates right-sided pneumonia.  Heart rate currently in the 90s and she continues to be hemodynamically stable without respiratory distress, increased work of breathing or hypoxia.  Will discharge home with prescriptions for Cefpodoxime and azithromycin.   At this time, I do not feel there is any life-threatening condition present. I have reviewed, interpreted and discussed all results (EKG, imaging, lab, urine as appropriate) and exam findings with patient/family. I have reviewed nursing notes and appropriate previous records.  I feel the patient is safe to be discharged home without further emergent workup and can continue workup as an outpatient as needed.  Discussed usual and customary return precautions. Patient/family verbalize understanding and are comfortable with this plan.  Outpatient follow-up has been provided as needed. All questions have been answered.  ____________________________________________   FINAL CLINICAL IMPRESSION(S) / ED DIAGNOSES  Final diagnoses:  Community acquired pneumonia of right lung, unspecified part of lung     ED Discharge Orders  Ordered    azithromycin (ZITHROMAX Z-PAK) 250 MG tablet        01/10/21 0456    cefpodoxime (VANTIN) 200 MG tablet  2 times daily        01/10/21 0456          *Please note:  Batsheva Stevick Touchton was evaluated in Emergency Department on 01/10/2021 for the symptoms described in the history of present illness. She was evaluated in the context of the global COVID-19 pandemic, which necessitated consideration that the patient might be at risk for infection with the SARS-CoV-2 virus that causes COVID-19. Institutional protocols and algorithms that pertain to the evaluation of patients at risk for COVID-19 are in a state of rapid change based on information released by regulatory bodies including the CDC and federal and state organizations. These policies and algorithms were followed during the patient's care in the ED.  Some ED evaluations and interventions may be delayed as a result of limited staffing during and the pandemic.*   Note:  This document was prepared using Dragon voice recognition software and may include unintentional dictation errors.   Necia Kamm, Delice Bison, DO 01/10/21 [phone]

## 2021-01-09 NOTE — ED Triage Notes (Addendum)
Patient with complaint of headache, fever, chest pain, shortness of breath. abdominal pain and body aches that started today. Patient states that she is having left side chest pain. Patient states that her temperature was 101.5. patient states that she took tylenol about 17:30. Patient added that she also had a nose bleed today.

## 2021-01-10 ENCOUNTER — Emergency Department: Payer: Medicaid Other

## 2021-01-10 DIAGNOSIS — R059 Cough, unspecified: Secondary | ICD-10-CM | POA: Diagnosis not present

## 2021-01-10 DIAGNOSIS — R079 Chest pain, unspecified: Secondary | ICD-10-CM | POA: Diagnosis not present

## 2021-01-10 DIAGNOSIS — I2699 Other pulmonary embolism without acute cor pulmonale: Secondary | ICD-10-CM | POA: Diagnosis not present

## 2021-01-10 DIAGNOSIS — R0602 Shortness of breath: Secondary | ICD-10-CM | POA: Diagnosis not present

## 2021-01-10 LAB — RESP PANEL BY RT-PCR (FLU A&B, COVID) ARPGX2
Influenza A by PCR: NEGATIVE
Influenza B by PCR: NEGATIVE
SARS Coronavirus 2 by RT PCR: NEGATIVE

## 2021-01-10 MED ORDER — AZITHROMYCIN 250 MG PO TABS: ORAL_TABLET | ORAL | 0 refills | Status: AC

## 2021-01-10 MED ORDER — IOHEXOL 350 MG/ML SOLN
75.0000 mL | Freq: Once | INTRAVENOUS | Status: AC | PRN
  Administered 2021-01-10: 75 mL via INTRAVENOUS

## 2021-01-10 MED ORDER — SODIUM CHLORIDE 0.9 % IV SOLN
1.0000 g | Freq: Once | INTRAVENOUS | Status: AC
  Administered 2021-01-10: 1 g via INTRAVENOUS
  Filled 2021-01-10: qty 10

## 2021-01-10 MED ORDER — CEFPODOXIME PROXETIL 200 MG PO TABS: 200.0000 mg | ORAL_TABLET | Freq: Two times a day (BID) | ORAL | 0 refills | Status: DC

## 2021-01-10 MED ORDER — SODIUM CHLORIDE 0.9 % IV SOLN
500.0000 mg | Freq: Once | INTRAVENOUS | Status: AC
  Administered 2021-01-10: 500 mg via INTRAVENOUS
  Filled 2021-01-10: qty 500

## 2021-01-10 MED ORDER — SODIUM CHLORIDE 0.9 % IV BOLUS (SEPSIS)
1000.0000 mL | Freq: Once | INTRAVENOUS | Status: AC
  Administered 2021-01-10: 1000 mL via INTRAVENOUS

## 2021-01-10 NOTE — ED Notes (Signed)
Patient back to room from CT

## 2021-01-10 NOTE — ED Notes (Signed)
ED Provider at bedside. 

## 2021-01-10 NOTE — ED Notes (Signed)
Ambulatory pulse ox complete at this time. Pt O2 sat 98-100% on RA. Tachycardia 130s noted. Provider made aware. Patient denies dizziness. Complains of shortness of breath upon exertion.

## 2021-01-10 NOTE — Discharge Instructions (Signed)
You may alternate Tylenol 1000 mg every 6 hours as needed for pain, fever and Ibuprofen 800 mg every 8 hours as needed for pain, fever.  Please take Ibuprofen with food.  Do not take more than 4000 mg of Tylenol (acetaminophen) in a 24 hour period.  

## 2021-01-11 ENCOUNTER — Telehealth: Payer: Self-pay

## 2021-01-11 NOTE — Telephone Encounter (Signed)
Transition Care Management Unsuccessful Follow-up Telephone Call  Date of discharge and from where:  01/10/2021 from [facility]   Attempts:  1st Attempt  Reason for unsuccessful TCM follow-up call:  Left voice message

## 2021-01-14 NOTE — Telephone Encounter (Signed)
Transition Care Management Unsuccessful Follow-up Telephone Call  Date of discharge and from where:  01/10/2021 from [facility]  Attempts:  2nd Attempt  Reason for unsuccessful TCM follow-up call:  Left voice message

## 2021-01-15 LAB — CULTURE, BLOOD (ROUTINE X 2)
Culture: NO GROWTH
Culture: NO GROWTH
Special Requests: ADEQUATE
Special Requests: ADEQUATE

## 2021-01-15 NOTE — Telephone Encounter (Signed)
Transition Care Management Follow-up Telephone Call  Date of discharge and from where: 01/10/2021 [facility] ED  How have you been since you were released from the hospital? "Feeling a little better"  Any questions or concerns? No  Items Reviewed:  Did the pt receive and understand the discharge instructions provided? Yes   Medications obtained and verified? Yes   Other? No   Any new allergies since your discharge? No   Dietary orders reviewed? No  Do you have support at home? Yes   Home Care and Equipment/Supplies: Were home health services ordered? not applicable If so, what is the name of the agency? N/A  Has the agency set up a time to come to the patient's home? not applicable Were any new equipment or medical supplies ordered?  No What is the name of the medical supply agency? N/A Were you able to get the supplies/equipment? not applicable Do you have any questions related to the use of the equipment or supplies? No  Functional Questionnaire: (I = Independent and D = Dependent) ADLs: I  Bathing/Dressing- I  Meal Prep- I  Eating- I  Maintaining continence- I  Transferring/Ambulation- I  Managing Meds- I  Follow up appointments reviewed:   PCP Hospital f/u appt confirmed? No    Specialist Hospital f/u appt confirmed? Yes  Scheduled to see Pulmonary on 02/19/2021 @ 1530.  Are transportation arrangements needed? No   If their condition worsens, is the pt aware to call PCP or go to the Emergency Dept.? Yes  Was the patient provided with contact information for the PCP's office or ED? Yes  Was to pt encouraged to call back with questions or concerns? Yes

## 2021-01-22 ENCOUNTER — Ambulatory Visit (INDEPENDENT_AMBULATORY_CARE_PROVIDER_SITE_OTHER): Payer: Medicaid Other | Admitting: Family Medicine

## 2021-01-22 ENCOUNTER — Encounter: Payer: Self-pay | Admitting: Family Medicine

## 2021-01-22 ENCOUNTER — Other Ambulatory Visit: Payer: Self-pay

## 2021-01-22 VITALS — BP 123/85 | HR 98 | Wt 212.0 lb

## 2021-01-22 DIAGNOSIS — J189 Pneumonia, unspecified organism: Secondary | ICD-10-CM

## 2021-01-22 MED ORDER — FLUCONAZOLE 150 MG PO TABS: 150.0000 mg | ORAL_TABLET | Freq: Every day | ORAL | 1 refills | Status: DC

## 2021-01-22 MED ORDER — AMOXICILLIN-POT CLAVULANATE 875-125 MG PO TABS: 1.0000 | ORAL_TABLET | Freq: Two times a day (BID) | ORAL | 0 refills | Status: DC

## 2021-01-22 NOTE — Progress Notes (Signed)
BP 123/85   Pulse 98   Wt 212 lb (96.2 kg)   SpO2 98%   BMI 35.83 kg/m    Subjective:    Patient ID: Cheryl Flowers, female    DOB: [birth year], 40 y.o.   MRN: [id number]  HPI: Cheryl Flowers is a 40 y.o. female  Chief Complaint  Patient presents with  . Pneumonia    Follow up    ER FOLLOW UP Time since discharge: 13 days Hospital/facility: [facility] Diagnosis: CAP Procedures/tests: CT chest  Consultants: none New medications: azithromycin and cefpodoxime  Discharge instructions:  Follow up here Status: better No more fevers. Feeling better. Not as tired as she was. Still coughing and SOB. Every now and then she's still having some pain in her chest. Did not take her abx due to her insurance not giving it to her, so only took the azithromycin. She is otherwise doing OK. No other concerns or complaints at this time.   Relevant past medical, surgical, family and social history reviewed and updated as indicated. Interim medical history since our last visit reviewed. Allergies and medications reviewed and updated.  Review of Systems  Constitutional: Positive for fatigue. Negative for activity change, appetite change, chills, diaphoresis, fever and unexpected weight change.  HENT: Negative.   Respiratory: Negative.   Cardiovascular: Negative.   Gastrointestinal: Negative.   Musculoskeletal: Negative.   Psychiatric/Behavioral: Negative.     Per HPI unless specifically indicated above     Objective:    BP 123/85   Pulse 98   Wt 212 lb (96.2 kg)   SpO2 98%   BMI 35.83 kg/m   Wt Readings from Last 3 Encounters:  01/22/21 212 lb (96.2 kg)  01/09/21 208 lb (94.3 kg)  11/22/20 205 lb (93 kg)    Physical Exam Vitals and nursing note reviewed.  Constitutional:      General: She is not in acute distress.    Appearance: Normal appearance. She is not ill-appearing, toxic-appearing or diaphoretic.  HENT:     Head: Normocephalic and atraumatic.     Right  Ear: External ear normal.     Left Ear: External ear normal.     Nose: Nose normal.     Mouth/Throat:     Mouth: Mucous membranes are moist.     Pharynx: Oropharynx is clear.  Eyes:     General: No scleral icterus.       Right eye: No discharge.        Left eye: No discharge.     Extraocular Movements: Extraocular movements intact.     Conjunctiva/sclera: Conjunctivae normal.     Pupils: Pupils are equal, round, and reactive to light.  Cardiovascular:     Rate and Rhythm: Normal rate and regular rhythm.     Pulses: Normal pulses.     Heart sounds: Normal heart sounds. No murmur heard. No friction rub. No gallop.   Pulmonary:     Effort: Pulmonary effort is normal. No respiratory distress.     Breath sounds: No stridor. Rhonchi (RLL) present. No wheezing or rales.  Chest:     Chest wall: No tenderness.  Musculoskeletal:        General: Normal range of motion.     Cervical back: Normal range of motion and neck supple.  Skin:    General: Skin is warm and dry.     Capillary Refill: Capillary refill takes less than 2 seconds.     Coloration: Skin is not jaundiced or  pale.     Findings: No bruising, erythema, lesion or rash.  Neurological:     General: No focal deficit present.     Mental Status: She is alert and oriented to person, place, and time. Mental status is at baseline.  Psychiatric:        Mood and Affect: Mood normal.        Behavior: Behavior normal.        Thought Content: Thought content normal.        Judgment: Judgment normal.     Results for orders placed or performed during the hospital encounter of 01/09/21  Resp Panel by RT-PCR (Flu A&B, Covid) Urine, Clean Catch   Specimen: Urine, Clean Catch; Nasopharyngeal(NP) swabs in vial transport medium  Result Value Ref Range   SARS Coronavirus 2 by RT PCR NEGATIVE NEGATIVE   Influenza A by PCR NEGATIVE NEGATIVE   Influenza B by PCR NEGATIVE NEGATIVE  Culture, blood (Routine X 2) w Reflex to ID Panel   Specimen:  BLOOD  Result Value Ref Range   Specimen Description BLOOD BLOOD LEFT WRIST    Special Requests      BOTTLES DRAWN AEROBIC AND ANAEROBIC Blood Culture adequate volume   Culture      NO GROWTH 5 DAYS Performed at [facility], [address] [place], [place] [address]    Report Status 01/15/2021 FINAL   Culture, blood (Routine X 2) w Reflex to ID Panel   Specimen: BLOOD  Result Value Ref Range   Specimen Description BLOOD RIGHT ANTECUBITAL    Special Requests      BOTTLES DRAWN AEROBIC AND ANAEROBIC Blood Culture adequate volume   Culture      NO GROWTH 5 DAYS Performed at [facility], [address] [place], [place] [address]    Report Status 01/15/2021 FINAL   Comprehensive metabolic panel  Result Value Ref Range   Sodium 135 135 - 145 mmol/L   Potassium 3.3 (L) 3.5 - 5.1 mmol/L   Chloride 109 98 - 111 mmol/L   CO2 18 (L) 22 - 32 mmol/L   Glucose, Bld 120 (H) 70 - 99 mg/dL   BUN 6 6 - 20 mg/dL   Creatinine, Ser 0.74 0.44 - 1.00 mg/dL   Calcium 8.8 (L) 8.9 - 10.3 mg/dL   Total Protein 7.8 6.5 - 8.1 g/dL   Albumin 3.7 3.5 - 5.0 g/dL   AST 16 15 - 41 U/L   ALT 14 0 - 44 U/L   Alkaline Phosphatase 61 38 - 126 U/L   Total Bilirubin 0.6 0.3 - 1.2 mg/dL   GFR, Estimated >60 >60 mL/min   Anion gap 8 5 - 15  Lactic acid, plasma  Result Value Ref Range   Lactic Acid, Venous 1.2 0.5 - 1.9 mmol/L  CBC with Differential  Result Value Ref Range   WBC 14.4 (H) 4.0 - 10.5 K/uL   RBC 4.20 3.87 - 5.11 MIL/uL   Hemoglobin 13.3 12.0 - 15.0 g/dL   HCT 39.1 36.0 - 46.0 %   MCV 93.1 80.0 - 100.0 fL   MCH 31.7 26.0 - 34.0 pg   MCHC 34.0 30.0 - 36.0 g/dL   RDW 14.6 11.5 - 15.5 %   Platelets 299 150 - 400 K/uL   nRBC 0.0 0.0 - 0.2 %   Neutrophils Relative % 71 %   Neutro Abs 10.3 (H) 1.7 - 7.7 K/uL   Lymphocytes Relative 21 %   Lymphs Abs 3.0  0.7 - 4.0 K/uL   Monocytes Relative 7 %   Monocytes Absolute 1.0 0.1 - 1.0 K/uL   Eosinophils Relative 0 %    Eosinophils Absolute 0.0 0.0 - 0.5 K/uL   Basophils Relative 1 %   Basophils Absolute 0.1 0.0 - 0.1 K/uL   Immature Granulocytes 0 %   Abs Immature Granulocytes 0.03 0.00 - 0.07 K/uL  Urinalysis, Complete w Microscopic Urine, Clean Catch  Result Value Ref Range   Color, Urine YELLOW (A) YELLOW   APPearance HAZY (A) CLEAR   Specific Gravity, Urine 1.020 1.005 - 1.030   pH 7.0 5.0 - 8.0   Glucose, UA NEGATIVE NEGATIVE mg/dL   Hgb urine dipstick MODERATE (A) NEGATIVE   Bilirubin Urine NEGATIVE NEGATIVE   Ketones, ur NEGATIVE NEGATIVE mg/dL   Protein, ur NEGATIVE NEGATIVE mg/dL   Nitrite NEGATIVE NEGATIVE   Leukocytes,Ua NEGATIVE NEGATIVE   RBC / HPF 21-50 0 - 5 RBC/hpf   WBC, UA 0-5 0 - 5 WBC/hpf   Bacteria, UA NONE SEEN NONE SEEN   Squamous Epithelial / LPF 0-5 0 - 5   Mucus PRESENT   POC urine preg, ED  Result Value Ref Range   Preg Test, Ur Negative Negative  Troponin I (High Sensitivity)  Result Value Ref Range   Troponin I (High Sensitivity) 4 <18 ng/L      Assessment & Plan:   Problem List Items Addressed This Visit   None   Visit Diagnoses    Community acquired pneumonia of right lung, unspecified part of lung    -  Primary   Only got her azithro, not her cephalosporin- will start her on augmentin and check 2 weeks with a CXR a couple of days before.    Relevant Medications   ADVAIR DISKUS 250-50 MCG/DOSE AEPB   amoxicillin-clavulanate (AUGMENTIN) 875-125 MG tablet   fluconazole (DIFLUCAN) 150 MG tablet   Other Relevant Orders   DG Chest 2 View       Follow up plan: Return in about 2 weeks (around 02/05/2021).

## 2021-01-28 ENCOUNTER — Other Ambulatory Visit: Payer: Self-pay | Admitting: Family Medicine

## 2021-01-28 ENCOUNTER — Other Ambulatory Visit: Payer: Self-pay

## 2021-01-28 ENCOUNTER — Ambulatory Visit: Payer: Medicaid Other | Admitting: Gastroenterology

## 2021-02-01 ENCOUNTER — Ambulatory Visit
Admission: RE | Admit: 2021-02-01 | Discharge: 2021-02-01 | Disposition: A | Discharge status: Home / Self Care | Payer: Medicaid Other | Source: Ambulatory Visit | Attending: Family Medicine | Admitting: Family Medicine

## 2021-02-01 ENCOUNTER — Ambulatory Visit
Admission: RE | Admit: 2021-02-01 | Discharge: 2021-02-01 | Disposition: A | Discharge status: Home / Self Care | Payer: Medicaid Other | Attending: Family Medicine | Admitting: Family Medicine

## 2021-02-01 ENCOUNTER — Other Ambulatory Visit: Payer: Self-pay

## 2021-02-01 DIAGNOSIS — R9389 Abnormal findings on diagnostic imaging of other specified body structures: Secondary | ICD-10-CM | POA: Diagnosis not present

## 2021-02-01 DIAGNOSIS — J189 Pneumonia, unspecified organism: Secondary | ICD-10-CM

## 2021-02-02 ENCOUNTER — Encounter (INDEPENDENT_AMBULATORY_CARE_PROVIDER_SITE_OTHER): Payer: Self-pay

## 2021-02-04 ENCOUNTER — Telehealth: Payer: Self-pay | Admitting: *Deleted

## 2021-02-04 ENCOUNTER — Ambulatory Visit: Payer: Medicaid Other | Admitting: Gastroenterology

## 2021-02-04 NOTE — Telephone Encounter (Signed)
[facility] nurse called to notify Dr. Wynetta Emery of results.

## 2021-02-04 NOTE — Telephone Encounter (Signed)
Dianne, Radiology Assistant from [facility] called with report of CXR obtained 02/01/21 and read by Dr Randa Ngo: IMPRESSION: "Persistent nodular consolidation right upper lobe, which could reflect residual pneumonia. Please note that it may take 6-8 weeks for complete radiographic resolution of pneumonia after appropriate medical management. Continued radiographic follow-up is recommended. If consolidation persists, repeat CT could be considered "; result repeated for accuracy; the pt is seen by Dr Park Liter, [facility]; report called to [facility].

## 2021-02-05 ENCOUNTER — Other Ambulatory Visit: Payer: Self-pay

## 2021-02-05 ENCOUNTER — Encounter: Payer: Self-pay | Admitting: Family Medicine

## 2021-02-05 ENCOUNTER — Ambulatory Visit: Payer: Medicaid Other | Admitting: Family Medicine

## 2021-02-05 VITALS — BP 118/84 | HR 89 | Temp 98.8°F | Wt 211.8 lb

## 2021-02-05 DIAGNOSIS — F988 Other specified behavioral and emotional disorders with onset usually occurring in childhood and adolescence: Secondary | ICD-10-CM

## 2021-02-05 DIAGNOSIS — J189 Pneumonia, unspecified organism: Secondary | ICD-10-CM | POA: Diagnosis not present

## 2021-02-05 MED ORDER — AMPHETAMINE-DEXTROAMPHET ER 15 MG PO CP24: 15.0000 mg | ORAL_CAPSULE | ORAL | 0 refills | Status: DC

## 2021-02-05 MED ORDER — AMPHETAMINE-DEXTROAMPHET ER 15 MG PO CP24
15.0000 mg | ORAL_CAPSULE | ORAL | 0 refills | Status: DC
Start: 2021-02-05 — End: 2021-04-17

## 2021-02-05 MED ORDER — LEVOFLOXACIN 750 MG PO TABS: 750.0000 mg | ORAL_TABLET | Freq: Every day | ORAL | 0 refills | Status: DC

## 2021-02-05 NOTE — Progress Notes (Signed)
BP 118/84   Pulse 89   Temp 98.8 F (37.1 C)   Wt 211 lb 12.8 oz (96.1 kg)   SpO2 98%   BMI 35.79 kg/m    Subjective:    Patient ID: Cheryl Flowers, female    DOB: 1981/06/09, 40 y.o.   MRN: [id number]  HPI: Cheryl Flowers is a 40 y.o. female  Chief Complaint  Patient presents with  . Pneumonia    Follow up   . ADHD   Still SOB. No fevers. Finished the abx. Did OK with them. Has been coughing a little bit, mainly at night.   ADHD FOLLOW UP ADHD status: controlled Satisfied with current therapy: yes Medication compliance:  excellent compliance Controlled substance contract: yes Previous psychiatry evaluation: no Previous medications: yes    Taking meds on weekends/vacations: yes Work/school performance:  excellent Difficulty sustaining attention/completing tasks: no Distracted by extraneous stimuli: no Does not listen when spoken to: no  Fidgets with hands or feet: no Unable to stay in seat: no Blurts out/interrupts others: no ADHD Medication Side Effects: no    Decreased appetite: no    Headache: no    Sleeping disturbance pattern: no    Irritability: no    Rebound effects (worse than baseline) off medication: no    Anxiousness: no    Dizziness: no    Tics: no   Relevant past medical, surgical, family and social history reviewed and updated as indicated. Interim medical history since our last visit reviewed. Allergies and medications reviewed and updated.  Review of Systems  Constitutional: Negative.   Respiratory: Positive for shortness of breath. Negative for apnea, cough, choking, chest tightness, wheezing and stridor.   Cardiovascular: Negative.   Gastrointestinal: Negative.   Musculoskeletal: Negative.   Psychiatric/Behavioral: Negative.     Per HPI unless specifically indicated above     Objective:    BP 118/84   Pulse 89   Temp 98.8 F (37.1 C)   Wt 211 lb 12.8 oz (96.1 kg)   SpO2 98%   BMI 35.79 kg/m   Wt Readings  from Last 3 Encounters:  02/05/21 211 lb 12.8 oz (96.1 kg)  01/22/21 212 lb (96.2 kg)  01/09/21 208 lb (94.3 kg)    Physical Exam Vitals and nursing note reviewed.  Constitutional:      General: She is not in acute distress.    Appearance: Normal appearance. She is not ill-appearing, toxic-appearing or diaphoretic.  HENT:     Head: Normocephalic and atraumatic.     Right Ear: External ear normal.     Left Ear: External ear normal.     Nose: Nose normal.     Mouth/Throat:     Mouth: Mucous membranes are moist.     Pharynx: Oropharynx is clear.  Eyes:     General: No scleral icterus.       Right eye: No discharge.        Left eye: No discharge.     Extraocular Movements: Extraocular movements intact.     Conjunctiva/sclera: Conjunctivae normal.     Pupils: Pupils are equal, round, and reactive to light.  Cardiovascular:     Rate and Rhythm: Normal rate and regular rhythm.     Pulses: Normal pulses.     Heart sounds: Normal heart sounds. No murmur heard. No friction rub. No gallop.   Pulmonary:     Effort: Pulmonary effort is normal. No respiratory distress.     Breath sounds: No stridor. No  wheezing, rhonchi or rales.     Comments: Decreased breath sounds on the RLL Chest:     Chest wall: No tenderness.  Musculoskeletal:        General: Normal range of motion.     Cervical back: Normal range of motion and neck supple.  Skin:    General: Skin is warm and dry.     Capillary Refill: Capillary refill takes less than 2 seconds.     Coloration: Skin is not jaundiced or pale.     Findings: No bruising, erythema, lesion or rash.  Neurological:     General: No focal deficit present.     Mental Status: She is alert and oriented to person, place, and time. Mental status is at baseline.  Psychiatric:        Mood and Affect: Mood normal.        Behavior: Behavior normal.        Thought Content: Thought content normal.        Judgment: Judgment normal.     Results for orders  placed or performed during the hospital encounter of 01/09/21  Resp Panel by RT-PCR (Flu A&B, Covid) Urine, Clean Catch   Specimen: Urine, Clean Catch; Nasopharyngeal(NP) swabs in vial transport medium  Result Value Ref Range   SARS Coronavirus 2 by RT PCR NEGATIVE NEGATIVE   Influenza A by PCR NEGATIVE NEGATIVE   Influenza B by PCR NEGATIVE NEGATIVE  Culture, blood (Routine X 2) w Reflex to ID Panel   Specimen: BLOOD  Result Value Ref Range   Specimen Description BLOOD BLOOD LEFT WRIST    Special Requests      BOTTLES DRAWN AEROBIC AND ANAEROBIC Blood Culture adequate volume   Culture      NO GROWTH 5 DAYS Performed at [facility], [address] [place], [place] [address]    Report Status 01/15/2021 FINAL   Culture, blood (Routine X 2) w Reflex to ID Panel   Specimen: BLOOD  Result Value Ref Range   Specimen Description BLOOD RIGHT ANTECUBITAL    Special Requests      BOTTLES DRAWN AEROBIC AND ANAEROBIC Blood Culture adequate volume   Culture      NO GROWTH 5 DAYS Performed at [facility], [address] [place], [place] [address]    Report Status 01/15/2021 FINAL   Comprehensive metabolic panel  Result Value Ref Range   Sodium 135 135 - 145 mmol/L   Potassium 3.3 (L) 3.5 - 5.1 mmol/L   Chloride 109 98 - 111 mmol/L   CO2 18 (L) 22 - 32 mmol/L   Glucose, Bld 120 (H) 70 - 99 mg/dL   BUN 6 6 - 20 mg/dL   Creatinine, Ser 0.74 0.44 - 1.00 mg/dL   Calcium 8.8 (L) 8.9 - 10.3 mg/dL   Total Protein 7.8 6.5 - 8.1 g/dL   Albumin 3.7 3.5 - 5.0 g/dL   AST 16 15 - 41 U/L   ALT 14 0 - 44 U/L   Alkaline Phosphatase 61 38 - 126 U/L   Total Bilirubin 0.6 0.3 - 1.2 mg/dL   GFR, Estimated >60 >60 mL/min   Anion gap 8 5 - 15  Lactic acid, plasma  Result Value Ref Range   Lactic Acid, Venous 1.2 0.5 - 1.9 mmol/L  CBC with Differential  Result Value Ref Range   WBC 14.4 (H) 4.0 - 10.5 K/uL   RBC 4.20 3.87 - 5.11 MIL/uL   Hemoglobin 13.3 12.0 - 15.0  g/dL   HCT  39.1 36.0 - 46.0 %   MCV 93.1 80.0 - 100.0 fL   MCH 31.7 26.0 - 34.0 pg   MCHC 34.0 30.0 - 36.0 g/dL   RDW 14.6 11.5 - 15.5 %   Platelets 299 150 - 400 K/uL   nRBC 0.0 0.0 - 0.2 %   Neutrophils Relative % 71 %   Neutro Abs 10.3 (H) 1.7 - 7.7 K/uL   Lymphocytes Relative 21 %   Lymphs Abs 3.0 0.7 - 4.0 K/uL   Monocytes Relative 7 %   Monocytes Absolute 1.0 0.1 - 1.0 K/uL   Eosinophils Relative 0 %   Eosinophils Absolute 0.0 0.0 - 0.5 K/uL   Basophils Relative 1 %   Basophils Absolute 0.1 0.0 - 0.1 K/uL   Immature Granulocytes 0 %   Abs Immature Granulocytes 0.03 0.00 - 0.07 K/uL  Urinalysis, Complete w Microscopic Urine, Clean Catch  Result Value Ref Range   Color, Urine YELLOW (A) YELLOW   APPearance HAZY (A) CLEAR   Specific Gravity, Urine 1.020 1.005 - 1.030   pH 7.0 5.0 - 8.0   Glucose, UA NEGATIVE NEGATIVE mg/dL   Hgb urine dipstick MODERATE (A) NEGATIVE   Bilirubin Urine NEGATIVE NEGATIVE   Ketones, ur NEGATIVE NEGATIVE mg/dL   Protein, ur NEGATIVE NEGATIVE mg/dL   Nitrite NEGATIVE NEGATIVE   Leukocytes,Ua NEGATIVE NEGATIVE   RBC / HPF 21-50 0 - 5 RBC/hpf   WBC, UA 0-5 0 - 5 WBC/hpf   Bacteria, UA NONE SEEN NONE SEEN   Squamous Epithelial / LPF 0-5 0 - 5   Mucus PRESENT   POC urine preg, ED  Result Value Ref Range   Preg Test, Ur Negative Negative  Troponin I (High Sensitivity)  Result Value Ref Range   Troponin I (High Sensitivity) 4 <18 ng/L      Assessment & Plan:   Problem List Items Addressed This Visit      Other   Attention deficit disorder (ADD) in adult    Under good control on current regimen. Continue current regimen. Continue to monitor. Call with any concerns. Refills given for 3 months. Follow up 3 months.         Other Visit Diagnoses    Community acquired pneumonia of right lung, unspecified part of lung    -  Primary   Persistant pneumonia on the CXR with SOB and decreased breath sounds. Will treat with levaquin and recheck 2 weeks. Call  with any concerns. Continue to monitor.   Relevant Medications   levofloxacin (LEVAQUIN) 750 MG tablet       Follow up plan: Return 2 weeks, for Before 05/06/21 for physical.

## 2021-02-05 NOTE — Assessment & Plan Note (Signed)
Under good control on current regimen. Continue current regimen. Continue to monitor. Call with any concerns. Refills given for 3 months. Follow up 3 months.

## 2021-02-11 ENCOUNTER — Encounter: Payer: Self-pay | Admitting: Family Medicine

## 2021-02-11 ENCOUNTER — Telehealth (INDEPENDENT_AMBULATORY_CARE_PROVIDER_SITE_OTHER): Payer: Medicaid Other | Admitting: Family Medicine

## 2021-02-11 ENCOUNTER — Other Ambulatory Visit: Payer: Self-pay | Admitting: Family Medicine

## 2021-02-11 VITALS — BP 136/76 | HR 104 | Temp 98.8°F

## 2021-02-11 DIAGNOSIS — Z20828 Contact with and (suspected) exposure to other viral communicable diseases: Secondary | ICD-10-CM | POA: Diagnosis not present

## 2021-02-11 DIAGNOSIS — R509 Fever, unspecified: Secondary | ICD-10-CM

## 2021-02-11 DIAGNOSIS — J189 Pneumonia, unspecified organism: Secondary | ICD-10-CM

## 2021-02-11 LAB — VERITOR FLU A/B WAIVED
Influenza A: NEGATIVE
Influenza B: NEGATIVE

## 2021-02-11 MED ORDER — HYDROCOD POLST-CPM POLST ER 10-8 MG/5ML PO SUER: 5.0000 mL | Freq: Two times a day (BID) | ORAL | 0 refills | Status: DC | PRN

## 2021-02-11 MED ORDER — BENZONATATE 200 MG PO CAPS
200.0000 mg | ORAL_CAPSULE | Freq: Two times a day (BID) | ORAL | 0 refills | Status: DC | PRN
Start: 2021-02-11 — End: 2021-04-10

## 2021-02-11 NOTE — Progress Notes (Signed)
BP 136/76   Pulse (!) 104   Temp 98.8 F (37.1 C)   SpO2 97%    Subjective:    Patient ID: Cheryl Flowers, female    DOB: 13-Mar-1981, 40 y.o.   MRN: [id number]  HPI: Cheryl Flowers is a 40 y.o. female  Chief Complaint  Patient presents with  . Cough    Patient states she is coughing more and feeling more fatigue. Had temperature of 101.0 this morning.    UPPER RESPIRATORY TRACT INFECTION- husband got diagnosed with the Flu on Thursday Duration: Saturday  Worst symptom: cough and aching in her chest Fever: yes Cough: yes Shortness of breath: yes Wheezing: no Chest pain: yes Chest tightness: yes Chest congestion: no Nasal congestion: yes Runny nose: yes Post nasal drip: yes Sneezing: no Sore throat: no Swollen glands: no Sinus pressure: no Headache: no Face pain: no Toothache: no Ear pain: yes- L ear  Ear pressure: no  Eyes red/itching:no Eye drainage/crusting: no  Vomiting: no Rash: no Fatigue: yes Sick contacts: yes Strep contacts: no  Context: worse Recurrent sinusitis: no Relief with OTC cold/cough medications: no  Treatments attempted: ibuprofen, antibiotics   Relevant past medical, surgical, family and social history reviewed and updated as indicated. Interim medical history since our last visit reviewed. Allergies and medications reviewed and updated.  Review of Systems  Constitutional: Negative.   HENT: Positive for congestion, postnasal drip and rhinorrhea. Negative for dental problem, drooling, ear discharge, ear pain, facial swelling, hearing loss, mouth sores, nosebleeds, sinus pressure, sinus pain, sneezing, sore throat, tinnitus, trouble swallowing and voice change.   Respiratory: Positive for cough, chest tightness, shortness of breath and wheezing. Negative for apnea, choking and stridor.   Cardiovascular: Negative.   Gastrointestinal: Negative.   Musculoskeletal: Negative.   Psychiatric/Behavioral: Negative.     Per  HPI unless specifically indicated above     Objective:    BP 136/76   Pulse (!) 104   Temp 98.8 F (37.1 C)   SpO2 97%   Wt Readings from Last 3 Encounters:  02/05/21 211 lb 12.8 oz (96.1 kg)  01/22/21 212 lb (96.2 kg)  01/09/21 208 lb (94.3 kg)    Physical Exam Vitals and nursing note reviewed.  Constitutional:      General: She is not in acute distress.    Appearance: Normal appearance. She is not ill-appearing, toxic-appearing or diaphoretic.  HENT:     Head: Normocephalic and atraumatic.     Right Ear: External ear normal.     Left Ear: External ear normal.     Nose: Nose normal.     Mouth/Throat:     Mouth: Mucous membranes are moist.     Pharynx: Oropharynx is clear.  Eyes:     General: No scleral icterus.       Right eye: No discharge.        Left eye: No discharge.     Conjunctiva/sclera: Conjunctivae normal.     Pupils: Pupils are equal, round, and reactive to light.  Pulmonary:     Effort: Pulmonary effort is normal. No respiratory distress.     Comments: Speaking in full sentences, + dry cough Musculoskeletal:        General: Normal range of motion.     Cervical back: Normal range of motion.  Skin:    Coloration: Skin is not jaundiced or pale.     Findings: No bruising, erythema, lesion or rash.  Neurological:     Mental Status:  She is alert and oriented to person, place, and time. Mental status is at baseline.  Psychiatric:        Mood and Affect: Mood normal.        Behavior: Behavior normal.        Thought Content: Thought content normal.        Judgment: Judgment normal.     Results for orders placed or performed during the hospital encounter of 01/09/21  Resp Panel by RT-PCR (Flu A&B, Covid) Urine, Clean Catch   Specimen: Urine, Clean Catch; Nasopharyngeal(NP) swabs in vial transport medium  Result Value Ref Range   SARS Coronavirus 2 by RT PCR NEGATIVE NEGATIVE   Influenza A by PCR NEGATIVE NEGATIVE   Influenza B by PCR NEGATIVE NEGATIVE   Culture, blood (Routine X 2) w Reflex to ID Panel   Specimen: BLOOD  Result Value Ref Range   Specimen Description BLOOD BLOOD LEFT WRIST    Special Requests      BOTTLES DRAWN AEROBIC AND ANAEROBIC Blood Culture adequate volume   Culture      NO GROWTH 5 DAYS Performed at [facility], [address] [place], [place] [address]    Report Status 01/15/2021 FINAL   Culture, blood (Routine X 2) w Reflex to ID Panel   Specimen: BLOOD  Result Value Ref Range   Specimen Description BLOOD RIGHT ANTECUBITAL    Special Requests      BOTTLES DRAWN AEROBIC AND ANAEROBIC Blood Culture adequate volume   Culture      NO GROWTH 5 DAYS Performed at [facility], [address] [place], [place] [address]    Report Status 01/15/2021 FINAL   Comprehensive metabolic panel  Result Value Ref Range   Sodium 135 135 - 145 mmol/L   Potassium 3.3 (L) 3.5 - 5.1 mmol/L   Chloride 109 98 - 111 mmol/L   CO2 18 (L) 22 - 32 mmol/L   Glucose, Bld 120 (H) 70 - 99 mg/dL   BUN 6 6 - 20 mg/dL   Creatinine, Ser 0.74 0.44 - 1.00 mg/dL   Calcium 8.8 (L) 8.9 - 10.3 mg/dL   Total Protein 7.8 6.5 - 8.1 g/dL   Albumin 3.7 3.5 - 5.0 g/dL   AST 16 15 - 41 U/L   ALT 14 0 - 44 U/L   Alkaline Phosphatase 61 38 - 126 U/L   Total Bilirubin 0.6 0.3 - 1.2 mg/dL   GFR, Estimated >60 >60 mL/min   Anion gap 8 5 - 15  Lactic acid, plasma  Result Value Ref Range   Lactic Acid, Venous 1.2 0.5 - 1.9 mmol/L  CBC with Differential  Result Value Ref Range   WBC 14.4 (H) 4.0 - 10.5 K/uL   RBC 4.20 3.87 - 5.11 MIL/uL   Hemoglobin 13.3 12.0 - 15.0 g/dL   HCT 39.1 36.0 - 46.0 %   MCV 93.1 80.0 - 100.0 fL   MCH 31.7 26.0 - 34.0 pg   MCHC 34.0 30.0 - 36.0 g/dL   RDW 14.6 11.5 - 15.5 %   Platelets 299 150 - 400 K/uL   nRBC 0.0 0.0 - 0.2 %   Neutrophils Relative % 71 %   Neutro Abs 10.3 (H) 1.7 - 7.7 K/uL   Lymphocytes Relative 21 %   Lymphs Abs 3.0 0.7 - 4.0 K/uL   Monocytes Relative 7 %   Monocytes  Absolute 1.0 0.1 - 1.0 K/uL   Eosinophils Relative 0 %   Eosinophils  Absolute 0.0 0.0 - 0.5 K/uL   Basophils Relative 1 %   Basophils Absolute 0.1 0.0 - 0.1 K/uL   Immature Granulocytes 0 %   Abs Immature Granulocytes 0.03 0.00 - 0.07 K/uL  Urinalysis, Complete w Microscopic Urine, Clean Catch  Result Value Ref Range   Color, Urine YELLOW (A) YELLOW   APPearance HAZY (A) CLEAR   Specific Gravity, Urine 1.020 1.005 - 1.030   pH 7.0 5.0 - 8.0   Glucose, UA NEGATIVE NEGATIVE mg/dL   Hgb urine dipstick MODERATE (A) NEGATIVE   Bilirubin Urine NEGATIVE NEGATIVE   Ketones, ur NEGATIVE NEGATIVE mg/dL   Protein, ur NEGATIVE NEGATIVE mg/dL   Nitrite NEGATIVE NEGATIVE   Leukocytes,Ua NEGATIVE NEGATIVE   RBC / HPF 21-50 0 - 5 RBC/hpf   WBC, UA 0-5 0 - 5 WBC/hpf   Bacteria, UA NONE SEEN NONE SEEN   Squamous Epithelial / LPF 0-5 0 - 5   Mucus PRESENT   POC urine preg, ED  Result Value Ref Range   Preg Test, Ur Negative Negative  Troponin I (High Sensitivity)  Result Value Ref Range   Troponin I (High Sensitivity) 4 <18 ng/L      Assessment & Plan:   Problem List Items Addressed This Visit   None   Visit Diagnoses    Exposure to the flu    -  Primary   Will treat with tussionex and tessalon. Will get her in ASAP for swab and start tamiflu if needed. Call with any concerns or if not getting better.    Relevant Orders   Veritor Flu A/B Waived       Follow up plan: Return if symptoms worsen or fail to improve.    . This visit was completed via video visit through MyChart due to the restrictions of the COVID-19 pandemic. All issues as above were discussed and addressed. Physical exam was done as above through visual confirmation on video through MyChart. If it was felt that the patient should be evaluated in the office, they were directed there. The patient verbally consented to this visit. . Location of the patient: home . Location of the provider: work . Those involved with this  call:  . Provider: Park Liter, DO . CMA: Louanna Raw, [place] . Front Desk/Registration: Levert Feinstein  . Time spent on call: 15 minutes with patient face to face via video conference. More than 50% of this time was spent in counseling and coordination of care. 23 minutes total spent in review of patient's record and preparation of their chart.

## 2021-02-12 ENCOUNTER — Other Ambulatory Visit: Payer: Medicaid Other

## 2021-02-13 ENCOUNTER — Other Ambulatory Visit: Payer: Self-pay

## 2021-02-13 ENCOUNTER — Telehealth (INDEPENDENT_AMBULATORY_CARE_PROVIDER_SITE_OTHER): Payer: Medicaid Other | Admitting: Gastroenterology

## 2021-02-13 ENCOUNTER — Ambulatory Visit
Admission: RE | Admit: 2021-02-13 | Discharge: 2021-02-13 | Disposition: A | Discharge status: Home / Self Care | Payer: Medicaid Other | Source: Ambulatory Visit | Attending: Family Medicine | Admitting: Family Medicine

## 2021-02-13 ENCOUNTER — Ambulatory Visit
Admission: RE | Admit: 2021-02-13 | Discharge: 2021-02-13 | Disposition: A | Discharge status: Home / Self Care | Payer: Medicaid Other | Attending: Family Medicine | Admitting: Family Medicine

## 2021-02-13 DIAGNOSIS — R509 Fever, unspecified: Secondary | ICD-10-CM

## 2021-02-13 DIAGNOSIS — J189 Pneumonia, unspecified organism: Secondary | ICD-10-CM | POA: Insufficient documentation

## 2021-02-13 DIAGNOSIS — R059 Cough, unspecified: Secondary | ICD-10-CM | POA: Diagnosis not present

## 2021-02-13 DIAGNOSIS — R0602 Shortness of breath: Secondary | ICD-10-CM | POA: Diagnosis not present

## 2021-02-13 DIAGNOSIS — K5080 Crohn's disease of both small and large intestine without complications: Secondary | ICD-10-CM

## 2021-02-13 NOTE — Progress Notes (Signed)
[name], MD [address]  [place]  [place],  [address]  Main: [phone]  Fax: [phone]   Primary Care Physician: Cheryl Roys, DO  Virtual Visit via Video Note  I connected with patient on 02/13/21 at  1:30 PM EDT by video and verified that I am speaking with the correct person using two identifiers.   I discussed the limitations, risks, security and privacy concerns of performing an evaluation and management service by video and the availability of in person appointments. I also discussed with the patient that there may be a patient responsible charge related to this service. The patient expressed understanding and agreed to proceed.  Location of Patient: Home Location of Provider: Home Persons involved: Patient and provider only (Nursing staff checked in patient via phone but were not physically involved in the video interaction - see their notes)   History of Present Illness: Chief complaint: Crohn's follow-up  HPI: Cheryl Flowers is a 40 y.o. female here for follow-up of Crohn's ileocolitis.  This was supposed to be an in person clinic visit, but patient recently has had pneumonia and fever, cough, patient for exposure and requested a virtual visit instead.  Patient states that she missed her doses of Humira during the last 2 weeks of February and the entire month of March due to her shortness of breath and pneumonia at the time.  She did not notice any diarrhea or worsening Crohns symptoms during that time.  She has her Humira in April.  She continued to take her Imuran daily.  Has noticed constipation over the last few weeks and has been having a bowel movement every 2 to 3 days.  Has noted a small amount of blood in stool.  Previous history: Humira start date: 09/21/2020 Imuran start date: 09/26/2020  Most recent fecal calprotectin has significantly improved from before  colonoscopy October 2021 Impression: - The  examined portion of the ileum was normal.  Biopsied. - Crohn's disease. Inflammation was found in the  transverse colon, in the ascending colon and at the  cecum. This was moderate in severity, worsened  compared to previous examinations. Biopsied. - The distal rectum and anal verge are normal on  retroflexion view.  Index colonoscopy May 2021 Findings: -Nonbleeding ulcerated mucosa with no stigmata of recent bleeding were  present at the ileocecal valve.     - Terminal ileum could not be intubated, due to ulceration  present just proximal to the Ileocecal valve -Patchy moderate inflammation characterized by altered vascularity,  erythema and shallow ulcerations was found in the cecum.  -Patchy mild inflammation characterized by erythema and shallow  ulcerations was found in the transverse colon and in the ascending  colon.  -The descending colon appeared normal. -A patchy area of mildly erythematous mucosa was found in the rectum and  in the sigmoid colon.   There did not appear to be a stricture in the terminal ileum, but rather just inflammation, and therefore it was not intubated to avoid complications such as perforation.  Pathology:  A. COLON, CECUM; COLD BIOPSY:  - CHRONIC COLITIS WITH FOCAL MILD ACTIVITY.  - NEGATIVE FOR DYSPLASIA AND MALIGNANCY.   B. ILEOCECAL VALVE; COLD BIOPSY:  - CHRONIC ENTERITIS / COLITIS WITH MODERATE ACTIVITY AND ULCERATION.  - NEGATIVE FOR DYSPLASIA AND MALIGNANCY.   C. COLON, ASCENDING; COLD BIOPSY:  - CHRONIC COLITIS WITH MODERATE ACTIVITY.  - NEGATIVE FOR DYSPLASIA AND MALIGNANCY.   D. COLON, TRANSVERSE; COLD BIOPSY:  -  CHRONIC COLITIS WITH MODERATE ACTIVITY.  - NEGATIVE FOR DYSPLASIA AND MALIGNANCY.   E. COLON, DESCENDING;  COLD BIOPSY:  - CHRONIC INACTIVE COLITIS.  - NEGATIVE FOR DYSPLASIA AND MALIGNANCY.   F. COLON, RECTOSIGMOID; COLD BIOPSY:  - CHRONIC INACTIVE COLITIS.  - NEGATIVE FOR DYSPLASIA AND MALIGNANCY.   CDAI score was 120.  MRE was obtained and showed mild wall thickening and mucosal enhancement involving the distal ileum and cecum.   Based on CDAI score and colonoscopy findings, patient was considered low risk and budesonide was initially started.  Due to endoscopic disease despite budesonide, therapy was changed  Gold QuantiFERON is negative. TPMT is normal. Patient is hep B immune based on blood work.  Immunizations: Hep B series completed Influenza up-to-date Pneumovax completed 2019  Health maintenance: Patient was previously referred to dermatology for annual skin exam and was advised to follow-up with them on a yearly basis  Follow-up with GYN for regular Pap smears  Current Outpatient Medications  Medication Sig Dispense Refill  . Adalimumab (HUMIRA PEN) 40 MG/0.4ML PNKT INJECT 1 PEN UNDER THE SKIN EVERY 14 DAYS. 2 each 1  . Adalimumab (HUMIRA PEN-CD/UC/HS STARTER) 80 MG/0.8ML PNKT Initation: Inject 121m SQ on day 1 then 814mon day 15. 3 each 0  . ADVAIR DISKUS 250-50 MCG/DOSE AEPB Inhale 1 puff into the lungs 2 (two) times daily.    . Marland Kitchenlbuterol (VENTOLIN HFA) 108 (90 Base) MCG/ACT inhaler Inhale 1 puff into the lungs every 6 (six) hours as needed for wheezing or shortness of breath. (Patient not taking: Reported on 02/11/2021) 18 g 3  . amphetamine-dextroamphetamine (ADDERALL XR) 15 MG 24 hr capsule Take 1 capsule by mouth every morning. 30 capsule 0  . [START ON 03/07/2021] amphetamine-dextroamphetamine (ADDERALL XR) 15 MG 24 hr capsule Take 1 capsule by mouth every morning. 30 capsule 0  . [START ON 04/06/2021] amphetamine-dextroamphetamine (ADDERALL XR) 15 MG 24 hr capsule Take 1 capsule by mouth every morning. 30 capsule 0  . azaTHIOprine (IMURAN) 50 MG tablet Take 1  tablet (50 mg total) by mouth daily. 90 tablet 1  . benzonatate (TESSALON) 200 MG capsule Take 1 capsule (200 mg total) by mouth 2 (two) times daily as needed for cough. 20 capsule 0  . chlorpheniramine-HYDROcodone (TUSSIONEX PENNKINETIC ER) 10-8 MG/5ML SUER Take 5 mLs by mouth every 12 (twelve) hours as needed. 50 mL 0  . DULoxetine (CYMBALTA) 60 MG capsule Take 1 capsule (60 mg total) by mouth daily. 90 capsule 1  . methocarbamol (ROBAXIN) 500 MG tablet Take 0.5-1 tablets (250-500 mg total) by mouth every 8 (eight) hours as needed for muscle spasms. 90 tablet 1  . metoprolol succinate (TOPROL-XL) 25 MG 24 hr tablet Take 1 tablet (25 mg total) by mouth daily. 90 tablet 0  . norethindrone (AYGESTIN) 5 MG tablet Take 1 tablet by mouth daily.    . norethindrone-ethinyl estradiol-iron (LOESTRIN FE) 1.5-30 MG-MCG tablet Take 1 tablet by mouth daily.    . Marland Kitchenmeprazole (PRILOSEC) 20 MG capsule TAKE 1 CAPSULE BY MOUTH EVERY DAY (Patient not taking: No sig reported) 30 capsule 0  . QUEtiapine (SEROQUEL) 50 MG tablet Take 1-2 tablets (50-100 mg total) by mouth as needed. 180 tablet 1   No current facility-administered medications for this visit.    Allergies as of 02/13/2021 - Review Complete 02/11/2021  Allergen Reaction Noted  . Morphine and related Itching 12/15/2015  . Sulfa antibiotics Hives 12/15/2015    Review of Systems:    All systems reviewed  and negative except where noted in HPI.   Observations/Objective:  Labs: CMP     Component Value Date/Time   NA 135 01/09/2021 2031   NA 139 10/16/2020 0905   K 3.3 (L) 01/09/2021 2031   CL 109 01/09/2021 2031   CO2 18 (L) 01/09/2021 2031   GLUCOSE 120 (H) 01/09/2021 2031   BUN 6 01/09/2021 2031   BUN 5 (L) 10/16/2020 0905   CREATININE 0.74 01/09/2021 2031   CALCIUM 8.8 (L) 01/09/2021 2031   PROT 7.8 01/09/2021 2031   PROT 7.4 10/16/2020 0905   ALBUMIN 3.7 01/09/2021 2031   ALBUMIN 4.4 10/16/2020 0905   AST 16 01/09/2021 2031   ALT 14  01/09/2021 2031   ALKPHOS 61 01/09/2021 2031   BILITOT 0.6 01/09/2021 2031   BILITOT <0.2 10/16/2020 0905   GFRNONAA >60 01/09/2021 2031   GFRAA 102 10/16/2020 0905   Lab Results  Component Value Date   WBC 14.4 (H) 01/09/2021   HGB 13.3 01/09/2021   HCT 39.1 01/09/2021   MCV 93.1 01/09/2021   PLT 299 01/09/2021    Imaging Studies: DG Chest 2 View  Result Date: 02/02/2021 CLINICAL DATA:  History of pneumonia, prior abnormal chest x-ray EXAM: CHEST - 2 VIEW COMPARISON:  01/09/2021, 01/10/2021 FINDINGS: Frontal and lateral views of the chest demonstrate persistent nodular consolidation in the right suprahilar region measuring up to 1.9 cm in size. No new airspace disease, effusion, or pneumothorax. The cardiac silhouette is unremarkable. There are no acute bony abnormalities. IMPRESSION: 1. Persistent nodular consolidation right upper lobe, which could reflect residual pneumonia. Please note that it may take 6-8 weeks for complete radiographic resolution of pneumonia after appropriate medical management. Continued radiographic follow-up is recommended. If consolidation persists, repeat CT could be considered. These results will be called to the ordering clinician or representative by the Radiologist Assistant, and communication documented in the PACS or [employer]. Electronically Signed   By: Randa Ngo M.D.   On: 02/02/2021 16:57    Assessment and Plan:   Cheryl Flowers is a 40 y.o. y/o female here for follow-up of Crohn's ileocolitis with recent flu exposure, pneumonia and patient receiving antibiotics through PCP   Assessment and Plan: Patient is in need of repeat colonoscopy for follow-up of her Crohn's disease However, given recent illness with flu and pneumonia, we will need to wait on colonoscopy  However, patient is reporting constipation and abdominal bloating and does well after presenting symptoms when she was first diagnosed.  Will obtain MRE at this  time  Repeat fecal calprotectin  Importance of compliance with prescribed medication discussed in detail and patient verbalized understanding  I sent a message to her PCP Dr. Wynetta Emery to see if she is able to obtain PCV vaccination through their Clinic when her current infection resolves  Follow Up Instructions:    I discussed the assessment and treatment plan with the patient. The patient was provided an opportunity to ask questions and all were answered. The patient agreed with the plan and demonstrated an understanding of the instructions.   The patient was advised to call back or seek an in-person evaluation if the symptoms worsen or if the condition fails to improve as anticipated.  I provided 15 minutes of face-to-face time via video software during this encounter. Additional time was spent in reviewing patient's chart, placing orders etc.   Virgel Manifold, MD  Speech recognition software was used to dictate this note.

## 2021-02-17 ENCOUNTER — Ambulatory Visit
Admission: RE | Admit: 2021-02-17 | Discharge: 2021-02-17 | Disposition: A | Discharge status: Home / Self Care | Payer: Medicaid Other | Source: Ambulatory Visit | Attending: Gastroenterology | Admitting: Gastroenterology

## 2021-02-17 ENCOUNTER — Other Ambulatory Visit: Payer: Self-pay

## 2021-02-17 DIAGNOSIS — Z9049 Acquired absence of other specified parts of digestive tract: Secondary | ICD-10-CM | POA: Diagnosis not present

## 2021-02-17 DIAGNOSIS — K5080 Crohn's disease of both small and large intestine without complications: Secondary | ICD-10-CM | POA: Diagnosis not present

## 2021-02-17 DIAGNOSIS — R140 Abdominal distension (gaseous): Secondary | ICD-10-CM | POA: Diagnosis not present

## 2021-02-17 DIAGNOSIS — D259 Leiomyoma of uterus, unspecified: Secondary | ICD-10-CM | POA: Diagnosis not present

## 2021-02-17 DIAGNOSIS — K6389 Other specified diseases of intestine: Secondary | ICD-10-CM | POA: Diagnosis not present

## 2021-02-17 MED ORDER — GADOBUTROL 1 MMOL/ML IV SOLN
9.0000 mL | Freq: Once | INTRAVENOUS | Status: AC | PRN
  Administered 2021-02-17: 9 mL via INTRAVENOUS

## 2021-02-19 ENCOUNTER — Ambulatory Visit: Payer: Medicaid Other | Admitting: Family Medicine

## 2021-02-19 ENCOUNTER — Ambulatory Visit: Payer: Medicaid Other | Admitting: Adult Health

## 2021-02-21 ENCOUNTER — Encounter: Payer: Self-pay | Admitting: Family Medicine

## 2021-02-21 ENCOUNTER — Other Ambulatory Visit: Payer: Self-pay

## 2021-02-21 ENCOUNTER — Ambulatory Visit: Payer: Medicaid Other | Admitting: Family Medicine

## 2021-02-21 VITALS — BP 119/81 | HR 101 | Temp 99.2°F | Wt 212.0 lb

## 2021-02-21 DIAGNOSIS — S0340XD Sprain of jaw, unspecified side, subsequent encounter: Secondary | ICD-10-CM

## 2021-02-21 DIAGNOSIS — J189 Pneumonia, unspecified organism: Secondary | ICD-10-CM

## 2021-02-21 DIAGNOSIS — M546 Pain in thoracic spine: Secondary | ICD-10-CM | POA: Diagnosis not present

## 2021-02-21 MED ORDER — METHOCARBAMOL 500 MG PO TABS: 250.0000 mg | ORAL_TABLET | Freq: Three times a day (TID) | ORAL | 1 refills | Status: DC | PRN

## 2021-02-21 NOTE — Patient Instructions (Signed)
Oral and maxillofacial surgery (3rd ed.). Elsevier. Retrieved from https://www.clinicalkey.com">  Jaw Range of Motion Exercises Jaw range of motion exercises are exercises that help your jaw move better. Exercises that help you have good posture (postural exercises) also help relieve jaw discomfort. These are often done along with range of motion exercises. These exercises can help prevent or improve:  Difficulty opening your mouth.  Pain in your jaw while it is open or closed.  Temporomandibular joint (TMJ) pain.  Headache caused by jaw tension. Take other actions to prevent or relieve jaw pain, such as:  Avoiding things that cause or increase jaw pain. This may include: ? Chewing gum or eating hard foods. ? Clenching your jaw or teeth, grinding your teeth, or keeping tension in your jaw muscles. ? Opening your mouth wide, such as for a big yawn. ? Leaning on your jaw, such as resting your jaw in your hand while leaning on a desk.  Putting ice on your jaw. ? Put ice in a plastic bag. ? Place a towel between your skin and the bag. ? Leave the ice on for 10-15 minutes, 2-3 times a day. Only do jaw exercises that your health care provider approves of. Only move your jaw as far as it can comfortably go in each direction. Do not move your jaw into positions that cause pain. Range of motion exercises Repeat each of these exercises 8 times, 1-2 times a day, or as told by your health care provider. Exercise A: Forward protrusion 1. Push your jaw forward. Hold this position for 1-2 seconds. 2. Allow your jaw to return to its normal position and rest it there for 1-2 seconds. Exercise B: Controlled opening 1. Stand or sit in front of a mirror. Place your tongue on the roof of your mouth, just behind your top teeth. 2. Keeping your tongue on the roof of your mouth, slowly open and close your mouth. 3. While you open and close your mouth, watch your jaw in the mirror. Try to keep your jaw from  moving to one side or the other. Exercise C: Right and left motion 1. Move your jaw right. Hold this position for 1-2 seconds. Allow your jaw to return to its normal position, and rest it there for 1-2 seconds. 2. Move your jaw left. Hold this position for 1-2 seconds. Allow your jaw to return to its normal position, and rest it there for 1-2 seconds. Postural exercises Exercise A: Chin tucks 1. You can do this exercise sitting, standing, or lying down. 2. Move your head straight back, keeping your head level. You can guide the movement by placing your fingers on your chin to push your jaw back in an even motion. You should be able to feel a double chin form at the end of the motion. 3. Hold this position for 5 seconds. Repeat 10-15 times. Exercise B: Shoulder blade squeeze 1. Sit or stand. 2. Bend your elbows to about 90 degrees, which is the shape of a capital letter "L." Keep your upper arms by your body. 3. Squeeze your shoulder blades down and back, as though you were trying to touch your elbows behind you. Do not shrug your shoulders or move your head. 4. Hold this position for 5 seconds. Repeat 10-15 times. Exercise C: Chest stretch 1. Stand facing a corner. 2. Put both of your hands and your forearms on the wall, with your arms wide apart. 3. Make sure your arms are at a 90-degree angle to  your body. This means that you should hold your arms straight out from your body, level with the floor. 4. Step in toward the corner. Do not lean in. 5. Hold this position for 30 seconds. Repeat 3 times. Contact a health care provider if you have:  Jaw pain that is new or gets worse.  Clicking or popping sounds while doing the exercises. Get help right away if:  Your jaw is stuck in one place and you cannot move it.  You cannot open or close your mouth. Summary  Jaw range of motion exercises are exercises that help your jaw move better.  Take actions to prevent or relieve jaw pain: limit  chewing gum or eating hard foods; clenching your jaw or teeth; or leaning on your jaw, such as resting your jaw in your hand while leaning on a desk.  Repeat each of the jaw range of motion exercises 8 times, 1-2 times a day, or as told by your health care provider.  Contact a health care provider if you have clicking or popping sounds while doing the exercises. This information is not intended to replace advice given to you by your health care provider. Make sure you discuss any questions you have with your health care provider. Document Revised: 07/06/2020 Document Reviewed: 07/06/2020 Elsevier Patient Education  2021 [employer].

## 2021-02-21 NOTE — Progress Notes (Signed)
BP 119/81   Pulse (!) 101   Temp 99.2 F (37.3 C)   Wt 212 lb (96.2 kg)   SpO2 98%   BMI 35.83 kg/m    Subjective:    Patient ID: Cheryl Flowers, female    DOB: 1981/05/23, 40 y.o.   MRN: [id number]  HPI: Cheryl Flowers is a 40 y.o. female  Chief Complaint  Patient presents with  . Pneumonia    Patient here to follow up, cough is gone for the most part but worsens at night.   . Back Pain    Patient states for about a week she has been feeling like she pulled a muscle in her back on the right side. Has tried aleve and a muscle relaxer but did not help   . Jaw Pain    Patient states she has been having jaw pain for years, has seen ENT before. Patient states about a month ago it started radiating to her ear.    Cheryl Flowers presents today for follow up on her pneumonia. She is feeling better. No fevers. No chills. Cough is much better, she is having a little bit of a cough at night only. She notes that has been having pain in her upper back on the R side- in the area where she had her pneumonia. She notes that it is tight and aching. Nothing makes it better or worse. It's been going on for the past few weeks since she got sick. She also notes that her jaw has been hurting. It's tight and aching. Worse with moving her jaw. Nothing makes it better. No other concerns or complaints at this time.   Relevant past medical, surgical, family and social history reviewed and updated as indicated. Interim medical history since our last visit reviewed. Allergies and medications reviewed and updated.  Review of Systems  Constitutional: Negative.   HENT: Negative.   Respiratory: Positive for cough. Negative for apnea, choking, chest tightness, shortness of breath, wheezing and stridor.   Cardiovascular: Negative.   Gastrointestinal: Negative.   Musculoskeletal: Negative.   Psychiatric/Behavioral: Negative.     Per HPI unless specifically indicated above     Objective:    BP  119/81   Pulse (!) 101   Temp 99.2 F (37.3 C)   Wt 212 lb (96.2 kg)   SpO2 98%   BMI 35.83 kg/m   Wt Readings from Last 3 Encounters:  02/21/21 212 lb (96.2 kg)  02/05/21 211 lb 12.8 oz (96.1 kg)  01/22/21 212 lb (96.2 kg)    Physical Exam Vitals and nursing note reviewed.  Constitutional:      General: She is not in acute distress.    Appearance: Normal appearance. She is not ill-appearing, toxic-appearing or diaphoretic.  HENT:     Head: Normocephalic and atraumatic.     Right Ear: External ear normal.     Left Ear: External ear normal.     Nose: Nose normal.     Mouth/Throat:     Mouth: Mucous membranes are moist.     Pharynx: Oropharynx is clear.  Eyes:     General: No scleral icterus.       Right eye: No discharge.        Left eye: No discharge.     Extraocular Movements: Extraocular movements intact.     Conjunctiva/sclera: Conjunctivae normal.     Pupils: Pupils are equal, round, and reactive to light.  Cardiovascular:     Rate and Rhythm: Normal  rate and regular rhythm.     Pulses: Normal pulses.     Heart sounds: Normal heart sounds. No murmur heard. No friction rub. No gallop.   Pulmonary:     Effort: Pulmonary effort is normal. No respiratory distress.     Breath sounds: Normal breath sounds. No stridor. No wheezing, rhonchi or rales.  Chest:     Chest wall: No tenderness.  Musculoskeletal:        General: Normal range of motion.     Cervical back: Normal range of motion and neck supple.  Skin:    General: Skin is warm and dry.     Capillary Refill: Capillary refill takes less than 2 seconds.     Coloration: Skin is not jaundiced or pale.     Findings: No bruising, erythema, lesion or rash.  Neurological:     General: No focal deficit present.     Mental Status: She is alert and oriented to person, place, and time. Mental status is at baseline.  Psychiatric:        Mood and Affect: Mood normal.        Behavior: Behavior normal.        Thought  Content: Thought content normal.        Judgment: Judgment normal.     Results for orders placed or performed in visit on 02/11/21  Veritor Flu A/B Waived  Result Value Ref Range   Influenza A Negative Negative   Influenza B Negative Negative      Assessment & Plan:   Problem List Items Addressed This Visit   None   Visit Diagnoses    Community acquired pneumonia of right lung, unspecified part of lung    -  Primary   Lungs clear. Needs follow up x-ray in abot 3 weeks. Feeling better. Continue to monitor.    Sprain of temporomandibular joint, subsequent encounter       Will treat with robaxin. Call if not getting better and we can refer. Continue to monitor.    Acute right-sided thoracic back pain       Likely due to the pneumonia. Continue current regimen and heat. Follow up x-ray in about 3 weeks.    Relevant Medications   methocarbamol (ROBAXIN) 500 MG tablet       Follow up plan: Return Before 7/11 for ADD.

## 2021-02-22 ENCOUNTER — Other Ambulatory Visit: Payer: Medicaid Other

## 2021-02-22 DIAGNOSIS — K5080 Crohn's disease of both small and large intestine without complications: Secondary | ICD-10-CM | POA: Diagnosis not present

## 2021-02-27 LAB — CALPROTECTIN, FECAL: Calprotectin, Fecal: 37 ug/g (ref 0–120)

## 2021-03-14 DIAGNOSIS — G5602 Carpal tunnel syndrome, left upper limb: Secondary | ICD-10-CM | POA: Insufficient documentation

## 2021-03-15 ENCOUNTER — Other Ambulatory Visit: Payer: Self-pay

## 2021-03-15 ENCOUNTER — Ambulatory Visit
Admission: RE | Admit: 2021-03-15 | Discharge: 2021-03-15 | Disposition: A | Discharge status: Home / Self Care | Payer: Medicaid Other | Attending: Family Medicine | Admitting: Family Medicine

## 2021-03-15 ENCOUNTER — Ambulatory Visit
Admission: RE | Admit: 2021-03-15 | Discharge: 2021-03-15 | Disposition: A | Discharge status: Home / Self Care | Payer: Medicaid Other | Source: Ambulatory Visit | Attending: Family Medicine | Admitting: Family Medicine

## 2021-03-15 DIAGNOSIS — J189 Pneumonia, unspecified organism: Secondary | ICD-10-CM | POA: Insufficient documentation

## 2021-03-18 DIAGNOSIS — M65312 Trigger thumb, left thumb: Secondary | ICD-10-CM | POA: Diagnosis not present

## 2021-03-18 DIAGNOSIS — M65311 Trigger thumb, right thumb: Secondary | ICD-10-CM | POA: Diagnosis not present

## 2021-03-20 ENCOUNTER — Other Ambulatory Visit: Payer: Self-pay

## 2021-03-20 DIAGNOSIS — K5080 Crohn's disease of both small and large intestine without complications: Secondary | ICD-10-CM

## 2021-03-20 MED ORDER — NA SULFATE-K SULFATE-MG SULF 17.5-3.13-1.6 GM/177ML PO SOLN: ORAL | 0 refills | Status: DC

## 2021-03-20 NOTE — Addendum Note (Signed)
Addended by: Wayna Chalet on: 03/20/2021 12:09 PM   Modules accepted: Orders

## 2021-03-25 NOTE — Progress Notes (Signed)
@Patient  ID: Cheryl Flowers, female    DOB: May 03, 1981, 41 y.o.   MRN: [id number]  Chief Complaint  Patient presents with  . Follow-up    Still very SOB, dizzy spells    Referring provider: [name], DO obesity HPI: 40 year old female, former smoker. PMH significant for moderate persistent asthma, lung nodules, HTN, crohn's disease, obesity. Patient of Dr. Mortimer Fries, last seen in office on 09/01/2019. Maintained on Advair. Recommended to return in 6 months. CT chest in November 2020 showed previously identified left upper lobe pulmonary nodule has resolved.  03/26/2021  Patient presents today for overdue follow-up reactive airway/pulmonary nodule. She reports symptoms of tachycardia, dizziness and shortness of breath with exertion over the last two years. She has a nocturnal cough. Cough is np productive. She is complaint with Advair 250 1 puff twice a day. She does not have rescue inhaler. She had a holter monitor in June 2020, normal sinus rhythm with average heart rate 92. Intermittent sinus tachycardia few PVCs. Overall no significant arrhythmia. She temporarily stopped adderall for approx 1 month without improvement in her symptoms. She developed pneumonia in March 2022.  She had CTA in March 2022 that showed new airspace disease right upper lone with right hilar adenopathy, most likely pneumonia. Most recent follow-up CXR on 03/16/21 showed clear lungs. No active cardiopulmonary disease. Denies f/c/s, purulent cough or wheezing.    Allergies  Allergen Reactions  . Morphine And Related Itching  . Sulfa Antibiotics Hives    Immunization History  Administered Date(s) Administered  . DTaP 02/26/1982, 05/07/1982, 07/09/1982, 07/11/1983, 02/07/1987  . Hepatitis B 03/18/2000, 10/06/2016, 01/12/2017  . IPV 02/26/1982, 05/07/1982, 07/09/1982, 07/11/1983  . Influenza,inj,Quad PF,6+ Mos 08/05/2018, 07/03/2020  . Influenza-Unspecified 07/27/2017, 09/19/2019  . MMR 04/08/1983,  03/18/2000  . PPD Test 11/23/2017  . Pneumococcal Polysaccharide-23 05/12/2018  . Tdap 03/13/2015    Past Medical History:  Diagnosis Date  . Anxiety   . Depression   . Low back pain   . Marital conflict   . Panic disorder   . Thyromegaly     Tobacco History: Social History   Tobacco Use  Smoking Status Former Smoker  . Packs/day: 0.25  . Years: 19.00  . Pack years: 4.75  . Types: Cigarettes  . Quit date: 09/17/2020  . Years since quitting: 0.5  Smokeless Tobacco Never Used   Counseling given: Not Answered   Outpatient Medications Prior to Visit  Medication Sig Dispense Refill  . Adalimumab (HUMIRA PEN) 40 MG/0.4ML PNKT INJECT 1 PEN UNDER THE SKIN EVERY 14 DAYS. 2 each 1  . Adalimumab (HUMIRA PEN-CD/UC/HS STARTER) 80 MG/0.8ML PNKT Initation: Inject 176m SQ on day 1 then 840mon day 15. 3 each 0  . ADVAIR DISKUS 250-50 MCG/DOSE AEPB Inhale 1 puff into the lungs 2 (two) times daily.    . Marland Kitchenlbuterol (VENTOLIN HFA) 108 (90 Base) MCG/ACT inhaler Inhale 1 puff into the lungs every 6 (six) hours as needed for wheezing or shortness of breath. 18 g 3  . amphetamine-dextroamphetamine (ADDERALL XR) 15 MG 24 hr capsule Take 1 capsule by mouth every morning. 30 capsule 0  . [START ON 04/06/2021] amphetamine-dextroamphetamine (ADDERALL XR) 15 MG 24 hr capsule Take 1 capsule by mouth every morning. 30 capsule 0  . azaTHIOprine (IMURAN) 50 MG tablet Take 1 tablet (50 mg total) by mouth daily. 90 tablet 1  . benzonatate (TESSALON) 200 MG capsule Take 1 capsule (200 mg total) by mouth 2 (two) times daily as  needed for cough. 20 capsule 0  . chlorpheniramine-HYDROcodone (TUSSIONEX PENNKINETIC ER) 10-8 MG/5ML SUER Take 5 mLs by mouth every 12 (twelve) hours as needed. 50 mL 0  . DULoxetine (CYMBALTA) 60 MG capsule Take 1 capsule (60 mg total) by mouth daily. 90 capsule 1  . methocarbamol (ROBAXIN) 500 MG tablet Take 0.5-1 tablets (250-500 mg total) by mouth every 8 (eight) hours as needed for  muscle spasms. 90 tablet 1  . metoprolol succinate (TOPROL-XL) 25 MG 24 hr tablet Take 1 tablet (25 mg total) by mouth daily. 90 tablet 0  . Na Sulfate-K Sulfate-Mg Sulf 17.5-3.13-1.6 GM/177ML SOLN At 5 PM the day before procedure take 1 bottle and 5 hours before procedure take 1 bottle. 354 mL 0  . norethindrone (AYGESTIN) 5 MG tablet Take 1 tablet by mouth daily.    . norethindrone-ethinyl estradiol-iron (LOESTRIN FE) 1.5-30 MG-MCG tablet Take 1 tablet by mouth daily.    Marland Kitchen omeprazole (PRILOSEC) 20 MG capsule TAKE 1 CAPSULE BY MOUTH EVERY DAY 30 capsule 0  . QUEtiapine (SEROQUEL) 50 MG tablet Take 1-2 tablets (50-100 mg total) by mouth as needed. 180 tablet 1  . amphetamine-dextroamphetamine (ADDERALL XR) 15 MG 24 hr capsule Take 1 capsule by mouth every morning. 30 capsule 0   No facility-administered medications prior to visit.    Review of Systems  Review of Systems  Constitutional: Negative.   HENT: Negative.   Respiratory: Positive for cough and shortness of breath. Negative for wheezing.   Cardiovascular: Positive for leg swelling.  Neurological: Positive for dizziness.    Physical Exam  BP 122/80 (BP Location: Left Arm, Patient Position: Sitting, Cuff Size: Normal)   Pulse (!) 133   Temp (!) 97.1 F (36.2 C) (Temporal)   Ht 5' 4"  (1.626 m)   Wt 213 lb (96.6 kg)   SpO2 99%   BMI 36.56 kg/m  Physical Exam Constitutional:      General: She is not in acute distress.    Appearance: Normal appearance. She is not ill-appearing.  HENT:     Mouth/Throat:     Mouth: Mucous membranes are moist.     Pharynx: Oropharynx is clear.  Cardiovascular:     Rate and Rhythm: Regular rhythm. Tachycardia present.     Comments: Trace ankle edema Pulmonary:     Effort: Pulmonary effort is normal.     Breath sounds: Normal breath sounds. No wheezing or rhonchi.  Musculoskeletal:        General: Normal range of motion.  Skin:    General: Skin is warm and dry.  Neurological:      General: No focal deficit present.     Mental Status: She is alert and oriented to person, place, and time. Mental status is at baseline.  Psychiatric:        Mood and Affect: Mood normal.        Behavior: Behavior normal.        Thought Content: Thought content normal.        Judgment: Judgment normal.      Lab Results:  CBC    Component Value Date/Time   WBC 14.4 (H) 01/09/2021 2031   RBC 4.20 01/09/2021 2031   HGB 13.3 01/09/2021 2031   HGB 13.9 10/16/2020 0905   HCT 39.1 01/09/2021 2031   HCT 43.1 10/16/2020 0905   PLT 299 01/09/2021 2031   PLT 371 10/16/2020 0905   MCV 93.1 01/09/2021 2031   MCV 94 10/16/2020 0905   MCH 31.7  01/09/2021 2031   MCHC 34.0 01/09/2021 2031   RDW 14.6 01/09/2021 2031   RDW 13.1 10/16/2020 0905   LYMPHSABS 3.0 01/09/2021 2031   LYMPHSABS 2.7 07/03/2020 1044   MONOABS 1.0 01/09/2021 2031   EOSABS 0.0 01/09/2021 2031   EOSABS 0.2 07/03/2020 1044   BASOSABS 0.1 01/09/2021 2031   BASOSABS 0.0 07/03/2020 1044    BMET    Component Value Date/Time   NA 135 01/09/2021 2031   NA 139 10/16/2020 0905   K 3.3 (L) 01/09/2021 2031   CL 109 01/09/2021 2031   CO2 18 (L) 01/09/2021 2031   GLUCOSE 120 (H) 01/09/2021 2031   BUN 6 01/09/2021 2031   BUN 5 (L) 10/16/2020 0905   CREATININE 0.74 01/09/2021 2031   CALCIUM 8.8 (L) 01/09/2021 2031   GFRNONAA >60 01/09/2021 2031   GFRAA 102 10/16/2020 0905    BNP    Component Value Date/Time   BNP <2.5 04/05/2019 1104    ProBNP No results found for: PROBNP  Imaging: DG Chest 2 View  Result Date: 03/16/2021 CLINICAL DATA:  Follow-up pneumonia. EXAM: CHEST - 2 VIEW COMPARISON:  February 13, 2021 FINDINGS: The heart size and mediastinal contours are within normal limits. Both lungs are clear. The visualized skeletal structures are unremarkable. IMPRESSION: No active cardiopulmonary disease. Electronically Signed   By: Dorise Bullion III M.D   On: 03/16/2021 17:03     Assessment & Plan:    Asthma - Stable; Does not appear acutely exacerbated. She reports benefit from ICS/LABA.  - Continue Advair 250-65mg one puff BID   Tachycardia - Exertional tachycardia and shortness of breath. HR into the 130s, regular rhythm on exam. She had a holter monitor in 2020 that showed normal sinus rhythm with intermittent tachycardia few PVCs. Unable to perform ECG in office today d/t equipment malfunction. Advised patient to increase Toprol-XL to 531mdaily and follow-up with PCP for med management. Ordered echocardiogram. Referring to cardiology   Pulmonary nodule  - CT chest in November 2020 showed previously identified left upper lobe pulmonary nodule has resolved   ElMartyn EhrichNP 03/26/2021

## 2021-03-26 ENCOUNTER — Encounter: Payer: Self-pay | Admitting: Primary Care

## 2021-03-26 ENCOUNTER — Ambulatory Visit (INDEPENDENT_AMBULATORY_CARE_PROVIDER_SITE_OTHER): Payer: Medicaid Other | Admitting: Primary Care

## 2021-03-26 ENCOUNTER — Other Ambulatory Visit: Payer: Self-pay

## 2021-03-26 VITALS — BP 122/80 | HR 133 | Temp 97.1°F | Ht 64.0 in | Wt 213.0 lb

## 2021-03-26 DIAGNOSIS — R000 Tachycardia, unspecified: Secondary | ICD-10-CM | POA: Insufficient documentation

## 2021-03-26 DIAGNOSIS — J45909 Unspecified asthma, uncomplicated: Secondary | ICD-10-CM | POA: Insufficient documentation

## 2021-03-26 DIAGNOSIS — R911 Solitary pulmonary nodule: Secondary | ICD-10-CM | POA: Insufficient documentation

## 2021-03-26 DIAGNOSIS — J4530 Mild persistent asthma, uncomplicated: Secondary | ICD-10-CM | POA: Diagnosis not present

## 2021-03-26 MED ORDER — ALBUTEROL SULFATE HFA 108 (90 BASE) MCG/ACT IN AERS: 2.0000 | INHALATION_SPRAY | Freq: Four times a day (QID) | RESPIRATORY_TRACT | 11 refills | Status: DC | PRN

## 2021-03-26 NOTE — Patient Instructions (Addendum)
Recommendations: - Continue Advair 236mg 1 puff morning and evening (rinse mouth after use) - Refilling Albuterol, take 1-2 puffs every 6 hours as needed ONLY for breakthrough wheezing/shortness of breath  - Temporarily increase Toprol-XL to 518mdaily and follow-up with PCP for medication management (If HR <60 resume 2526moprol)  Orders: - ECG today re: tachycardia  - Echocardiogram re: tachycardia/ shortness of breath   Referral: - Cardiology re: tachycardia/dizziness/sob  Follow-up: - 4-6 months with Dr. KasMortimer Fries  Sinus Tachycardia  Sinus tachycardia is a kind of fast heartbeat. In sinus tachycardia, the heart beats more than 100 times a minute. Sinus tachycardia starts in a part of the heart called the sinus node. Sinus tachycardia may be harmless, or it may be a sign of a serious condition. What are the causes? This condition may be caused by:  Exercise or exertion.  A fever.  Pain.  Loss of body fluids (dehydration).  Severe bleeding (hemorrhage).  Anxiety and stress.  Certain substances, including: ? Alcohol. ? Caffeine. ? Tobacco and nicotine products. ? Cold medicines. ? Illegal drugs.  Medical conditions including: ? Heart disease. ? An infection. ? An overactive thyroid (hyperthyroidism). ? A lack of red blood cells (anemia). What are the signs or symptoms? Symptoms of this condition include:  A feeling that the heart is beating quickly (palpitations).  Suddenly noticing your heartbeat (cardiac awareness).  Dizziness.  Tiredness (fatigue).  Shortness of breath.  Chest pain.  Nausea.  Fainting. How is this diagnosed? This condition is diagnosed with:  A physical exam.  Other tests, such as: ? Blood tests. ? An electrocardiogram (ECG). This test measures the electrical activity of the heart. ? Ambulatory cardiac monitor. This records your heartbeats for 24 hours or more. You may be referred to a heart specialist  (cardiologist). How is this treated? Treatment for this condition depends on the cause or the underlying condition. Treatment may involve:  Treating the underlying condition.  Taking new medicines or changing your current medicines as told by your health care provider.  Making changes to your diet or lifestyle. Follow these instructions at home: Lifestyle  Do not use any products that contain nicotine or tobacco, such as cigarettes and e-cigarettes. If you need help quitting, ask your health care provider.  Do not use illegal drugs, such as cocaine.  Learn relaxation methods to help you when you get stressed or anxious. These include deep breathing.  Avoid caffeine or other stimulants.   Alcohol use  Do not drink alcohol if: ? Your health care provider tells you not to drink. ? You are pregnant, may be pregnant, or are planning to become pregnant.  If you drink alcohol, limit how much you have: ? 0-1 drink a day for women. ? 0-2 drinks a day for men.  Be aware of how much alcohol is in your drink. In the [place] one drink equals one typical bottle of beer (12 oz), one-half glass of wine (5 oz), or one shot of hard liquor (1 oz).   General instructions  Drink enough fluids to keep your urine pale yellow.  Take over-the-counter and prescription medicines only as told by your health care provider.  Keep all follow-up visits as told by your health care provider. This is important. Contact a health care provider if you have:  A fever.  Vomiting or diarrhea that does not go away. Get help right away if you:  Have pain in your chest, upper arms, jaw, or neck.  Become weak or dizzy.  Feel faint.  Have palpitations that do not go away. Summary  In sinus tachycardia, the heart beats more than 100 times a minute.  Sinus tachycardia may be harmless, or it may be a sign of a serious condition.  Treatment for this condition depends on the cause or the underlying  condition.  Get help right away if you have pain in your chest, upper arms, jaw, or neck. This information is not intended to replace advice given to you by your health care provider. Make sure you discuss any questions you have with your health care provider. Document Revised: 12/02/2017 Document Reviewed: 12/02/2017 Elsevier Patient Education  [employer].

## 2021-03-26 NOTE — Assessment & Plan Note (Addendum)
-   Stable; Does not appear acutely exacerbated. She reports benefit from ICS/LABA.  - Continue Advair 250-67mg one puff BID

## 2021-03-26 NOTE — Assessment & Plan Note (Signed)
-   CT chest in November 2020 showed previously identified left upper lobe pulmonary nodule has resolved

## 2021-03-26 NOTE — Assessment & Plan Note (Addendum)
-   Exertional tachycardia and shortness of breath. HR into the 130s, regular rhythm on exam. She had a holter monitor in 2020 that showed normal sinus rhythm with intermittent tachycardia few PVCs. Unable to perform ECG in office today d/t equipment malfunction. Advised patient to increase Toprol-XL to 23m daily and follow-up with PCP for med management. Ordered echocardiogram. Referring to cardiology

## 2021-03-27 ENCOUNTER — Encounter: Payer: Self-pay | Admitting: Gastroenterology

## 2021-03-27 ENCOUNTER — Encounter
Admission: RE | Disposition: A | Discharge status: Home / Self Care | Payer: Self-pay | Source: Home / Self Care | Attending: Gastroenterology

## 2021-03-27 ENCOUNTER — Ambulatory Visit: Payer: Medicaid Other | Admitting: Certified Registered"

## 2021-03-27 ENCOUNTER — Ambulatory Visit: Payer: Medicaid Other | Admitting: Gastroenterology

## 2021-03-27 ENCOUNTER — Other Ambulatory Visit: Payer: Self-pay

## 2021-03-27 ENCOUNTER — Ambulatory Visit
Admission: RE | Admit: 2021-03-27 | Discharge: 2021-03-27 | Disposition: A | Discharge status: Home / Self Care | Payer: Medicaid Other | Attending: Gastroenterology | Admitting: Gastroenterology

## 2021-03-27 DIAGNOSIS — Z8249 Family history of ischemic heart disease and other diseases of the circulatory system: Secondary | ICD-10-CM | POA: Diagnosis not present

## 2021-03-27 DIAGNOSIS — Z885 Allergy status to narcotic agent status: Secondary | ICD-10-CM | POA: Insufficient documentation

## 2021-03-27 DIAGNOSIS — K529 Noninfective gastroenteritis and colitis, unspecified: Secondary | ICD-10-CM

## 2021-03-27 DIAGNOSIS — Z833 Family history of diabetes mellitus: Secondary | ICD-10-CM | POA: Insufficient documentation

## 2021-03-27 DIAGNOSIS — D125 Benign neoplasm of sigmoid colon: Secondary | ICD-10-CM | POA: Insufficient documentation

## 2021-03-27 DIAGNOSIS — K5669 Other partial intestinal obstruction: Secondary | ICD-10-CM | POA: Insufficient documentation

## 2021-03-27 DIAGNOSIS — K5080 Crohn's disease of both small and large intestine without complications: Secondary | ICD-10-CM | POA: Insufficient documentation

## 2021-03-27 DIAGNOSIS — K635 Polyp of colon: Secondary | ICD-10-CM | POA: Diagnosis not present

## 2021-03-27 DIAGNOSIS — Z803 Family history of malignant neoplasm of breast: Secondary | ICD-10-CM | POA: Diagnosis not present

## 2021-03-27 DIAGNOSIS — Z87891 Personal history of nicotine dependence: Secondary | ICD-10-CM | POA: Diagnosis not present

## 2021-03-27 DIAGNOSIS — K5010 Crohn's disease of large intestine without complications: Secondary | ICD-10-CM | POA: Diagnosis not present

## 2021-03-27 DIAGNOSIS — Z7951 Long term (current) use of inhaled steroids: Secondary | ICD-10-CM | POA: Diagnosis not present

## 2021-03-27 DIAGNOSIS — F32A Depression, unspecified: Secondary | ICD-10-CM | POA: Diagnosis not present

## 2021-03-27 DIAGNOSIS — Z882 Allergy status to sulfonamides status: Secondary | ICD-10-CM | POA: Insufficient documentation

## 2021-03-27 DIAGNOSIS — Z8261 Family history of arthritis: Secondary | ICD-10-CM | POA: Insufficient documentation

## 2021-03-27 HISTORY — PX: COLONOSCOPY WITH PROPOFOL: SHX5780

## 2021-03-27 LAB — POCT PREGNANCY, URINE: Preg Test, Ur: NEGATIVE

## 2021-03-27 SURGERY — COLONOSCOPY WITH PROPOFOL
Anesthesia: General

## 2021-03-27 MED ORDER — PROPOFOL 10 MG/ML IV BOLUS
INTRAVENOUS | Status: DC | PRN
  Administered 2021-03-27: 100 mg via INTRAVENOUS

## 2021-03-27 MED ORDER — PROPOFOL 500 MG/50ML IV EMUL
INTRAVENOUS | Status: DC | PRN
  Administered 2021-03-27: 150 ug/kg/min via INTRAVENOUS

## 2021-03-27 MED ORDER — SODIUM CHLORIDE 0.9 % IV SOLN
INTRAVENOUS | Status: DC
Start: 2021-03-27 — End: 2021-03-27
  Administered 2021-03-27: 1000 mL via INTRAVENOUS

## 2021-03-27 NOTE — Transfer of Care (Signed)
Immediate Anesthesia Transfer of Care Note  Patient: Cheryl Flowers  Procedure(s) Performed: COLONOSCOPY WITH PROPOFOL (N/A )  Patient Location: PACU and Endoscopy Unit  Anesthesia Type:General  Level of Consciousness: drowsy  Airway & Oxygen Therapy: Patient Spontanous Breathing  Post-op Assessment: Report given to RN  Post vital signs: stable  Last Vitals:  Vitals Value Taken Time  BP    Temp    Pulse    Resp    SpO2      Last Pain:  Vitals:   03/27/21 1021  TempSrc: Temporal  PainSc: 6          Complications: No complications documented.

## 2021-03-27 NOTE — Anesthesia Postprocedure Evaluation (Signed)
Anesthesia Post Note  Patient: Cheryl Flowers  Procedure(s) Performed: COLONOSCOPY WITH PROPOFOL (N/A )  Patient location during evaluation: Endoscopy Anesthesia Type: General Level of consciousness: awake and alert Pain management: pain level controlled Vital Signs Assessment: post-procedure vital signs reviewed and stable Respiratory status: spontaneous breathing and respiratory function stable Cardiovascular status: stable Anesthetic complications: no   No complications documented.   Last Vitals:  Vitals:   03/27/21 1021  BP: (!) 131/100  Pulse: 100  Resp: 19  Temp: (!) 36.1 C  SpO2: 100%    Last Pain:  Vitals:   03/27/21 1021  TempSrc: Temporal  PainSc: 6                  Wang Granada K

## 2021-03-27 NOTE — H&P (Signed)
Cheryl Antigua, MD [address], [place], [place], [place], [address] [address], [place], [place], [place], [address] Phone: [phone]  Fax: [phone]  Primary Care Physician:  [name], DO   Pre-Procedure History & Physical: HPI:  Cheryl Flowers is a 40 y.o. female is here for a colonoscopy.   Past Medical History:  Diagnosis Date  . Anxiety   . Depression   . Low back pain   . Marital conflict   . Panic disorder   . Thyromegaly     Past Surgical History:  Procedure Laterality Date  . CESAREAN SECTION  2003 and 2010  . CHOLECYSTECTOMY  2007  . COLONOSCOPY WITH PROPOFOL N/A 03/07/2020   Procedure: COLONOSCOPY WITH PROPOFOL;  Surgeon: Virgel Manifold, MD;  Location: [facility] ENDOSCOPY;  Service: Endoscopy;  Laterality: N/A;  . COLONOSCOPY WITH PROPOFOL N/A 07/27/2020   Procedure: COLONOSCOPY WITH PROPOFOL;  Surgeon: Virgel Manifold, MD;  Location: [facility] ENDOSCOPY;  Service: Endoscopy;  Laterality: N/A;  . TUBAL LIGATION  2010    Prior to Admission medications   Medication Sig Start Date End Date Taking? Authorizing Provider  Adalimumab (HUMIRA PEN) 40 MG/0.4ML PNKT INJECT 1 PEN UNDER THE SKIN EVERY 14 DAYS. 12/12/20  Yes Virgel Manifold, MD  Adalimumab (HUMIRA PEN-CD/UC/HS STARTER) 80 MG/0.8ML PNKT Initation: Inject 131m SQ on day 1 then 864mon day 15. 09/13/20  Yes Champayne Kocian B, MD  ADVAIR DISKUS 250-50 MCG/DOSE AEPB Inhale 1 puff into the lungs 2 (two) times daily. 12/29/20  Yes [provider]  albuterol (VENTOLIN HFA) 108 (90 Base) MCG/ACT inhaler Inhale 1 puff into the lungs every 6 (six) hours as needed for wheezing or shortness of breath. 07/03/20  Yes Johnson, Megan P, DO  albuterol (VENTOLIN HFA) 108 (90 Base) MCG/ACT inhaler Inhale 2 puffs into the lungs every 6 (six) hours as needed for wheezing or shortness of breath. 03/26/21  Yes WaMartyn EhrichNP  amphetamine-dextroamphetamine (ADDERALL XR) 15 MG 24 hr  capsule Take 1 capsule by mouth every morning. 03/07/21 04/06/21 Yes Johnson, Megan P, DO  amphetamine-dextroamphetamine (ADDERALL XR) 15 MG 24 hr capsule Take 1 capsule by mouth every morning. 04/06/21 05/06/21 Yes Johnson, Megan P, DO  azaTHIOprine (IMURAN) 50 MG tablet Take 1 tablet (50 mg total) by mouth daily. 10/31/20  Yes TaVirgel ManifoldMD  DULoxetine (CYMBALTA) 60 MG capsule Take 1 capsule (60 mg total) by mouth daily. 11/05/20  Yes Johnson, Megan P, DO  methocarbamol (ROBAXIN) 500 MG tablet Take 0.5-1 tablets (250-500 mg total) by mouth every 8 (eight) hours as needed for muscle spasms. 02/21/21  Yes Johnson, Megan P, DO  metoprolol succinate (TOPROL-XL) 25 MG 24 hr tablet Take 1 tablet (25 mg total) by mouth daily. 07/03/20  Yes Johnson, Megan P, DO  Na Sulfate-K Sulfate-Mg Sulf 17.5-3.13-1.6 GM/177ML SOLN At 5 PM the day before procedure take 1 bottle and 5 hours before procedure take 1 bottle. 03/20/21  Yes TaVirgel ManifoldMD  norethindrone (AYGESTIN) 5 MG tablet Take 1 tablet by mouth daily. 06/04/20 06/04/21 Yes [provider]  norethindrone-ethinyl estradiol-iron (LOESTRIN FE) 1.5-30 MG-MCG tablet Take 1 tablet by mouth daily.   Yes [provider]  omeprazole (PRILOSEC) 20 MG capsule TAKE 1 CAPSULE BY MOUTH EVERY DAY 12/06/20  Yes Marcos Ruelas, VaMargretta Sidle, MD  QUEtiapine (SEROQUEL) 50 MG tablet Take 1-2 tablets (50-100 mg total) by mouth as needed. 11/05/20  Yes Johnson, Megan P, DO  amphetamine-dextroamphetamine (ADDERALL XR)  15 MG 24 hr capsule Take 1 capsule by mouth every morning. 02/05/21 03/07/21  Park Liter P, DO  benzonatate (TESSALON) 200 MG capsule Take 1 capsule (200 mg total) by mouth 2 (two) times daily as needed for cough. Patient not taking: Reported on 03/27/2021 02/11/21   Park Liter P, DO  chlorpheniramine-HYDROcodone (TUSSIONEX PENNKINETIC ER) 10-8 MG/5ML SUER Take 5 mLs by mouth every 12 (twelve) hours as needed. Patient not taking: Reported on  03/27/2021 02/11/21   Park Liter P, DO    Allergies as of 03/20/2021 - Review Complete 02/21/2021  Allergen Reaction Noted  . Morphine and related Itching 12/15/2015  . Sulfa antibiotics Hives 12/15/2015    Family History  Problem Relation Age of Onset  . Hypertension Mother   . Rheum arthritis Mother   . Diabetes Father   . Breast cancer Maternal Aunt        early 63  . Crohn's disease Maternal Aunt     Social History   Socioeconomic History  . Marital status: Married    Spouse name: Not on file  . Number of children: Not on file  . Years of education: Not on file  . Highest education level: Not on file  Occupational History  . Not on file  Tobacco Use  . Smoking status: Former Smoker    Packs/day: 0.25    Years: 19.00    Pack years: 4.75    Types: Cigarettes    Quit date: 09/17/2020    Years since quitting: 0.5  . Smokeless tobacco: Never Used  Vaping Use  . Vaping Use: Former  Substance and Sexual Activity  . Alcohol use: Not Currently  . Drug use: No  . Sexual activity: Yes    Birth control/protection: Surgical  Other Topics Concern  . Not on file  Social History Narrative  . Not on file   Social Determinants of Health   Financial Resource Strain: Not on file  Food Insecurity: Not on file  Transportation Needs: Not on file  Physical Activity: Not on file  Stress: Not on file  Social Connections: Not on file  Intimate Partner Violence: Not on file    Review of Systems: See HPI, otherwise negative ROS  Physical Exam: BP (!) 131/100   Pulse 100   Temp (!) 96.9 F (36.1 C) (Temporal)   Resp 19   Ht 5' 5"  (1.651 m)   Wt 96.6 kg   SpO2 100%   BMI 35.45 kg/m  General:   Alert,  pleasant and cooperative in NAD Head:  Normocephalic and atraumatic. Neck:  Supple; no masses or thyromegaly. Lungs:  Clear throughout to auscultation, normal respiratory effort.    Heart:  +S1, +S2, Regular rate and rhythm, No edema. Abdomen:  Soft, nontender and  nondistended. Normal bowel sounds, without guarding, and without rebound.   Neurologic:  Alert and  oriented x4;  grossly normal neurologically.  Impression/Plan: Cheryl Flowers is here for a colonoscopy to be performed for follow up of crohns disease.  Risks, benefits, limitations, and alternatives regarding  colonoscopy have been reviewed with the patient.  Questions have been answered.  All parties agreeable.   Virgel Manifold, MD  03/27/2021, 10:59 AM

## 2021-03-27 NOTE — Anesthesia Preprocedure Evaluation (Signed)
Anesthesia Evaluation  Patient identified by MRN, date of birth, ID band Patient awake    Reviewed: Allergy & Precautions, NPO status , Patient's Chart, lab work & pertinent test results  History of Anesthesia Complications Negative for: history of anesthetic complications  Airway Mallampati: II       Dental   Pulmonary asthma , neg sleep apnea, neg COPD, Not current smoker, former smoker,           Cardiovascular hypertension, Pt. on medications and Pt. on home beta blockers (-) Past MI and (-) CHF + dysrhythmias (tachycardia) (-) Valvular Problems/Murmurs     Neuro/Psych neg Seizures Anxiety Depression    GI/Hepatic Neg liver ROS, GERD  Medicated and Controlled,  Endo/Other  neg diabetes  Renal/GU negative Renal ROS     Musculoskeletal   Abdominal   Peds  Hematology   Anesthesia Other Findings   Reproductive/Obstetrics                            Anesthesia Physical Anesthesia Plan  ASA: II  Anesthesia Plan: General   Post-op Pain Management:    Induction: Intravenous  PONV Risk Score and Plan: 3 and Propofol infusion and TIVA  Airway Management Planned: Nasal Cannula  Additional Equipment:   Intra-op Plan:   Post-operative Plan:   Informed Consent: I have reviewed the patients History and Physical, chart, labs and discussed the procedure including the risks, benefits and alternatives for the proposed anesthesia with the patient or authorized representative who has indicated his/her understanding and acceptance.       Plan Discussed with:   Anesthesia Plan Comments:         Anesthesia Quick Evaluation

## 2021-03-27 NOTE — Op Note (Signed)
[facility] Gastroenterology Patient Name: Billiejo Sorto Procedure Date: 03/27/2021 10:57 AM MRN: [id number] Account #: [id number] Date of Birth: [birth year] Admit Type: Outpatient Age: 40 Room: [facility] ENDO ROOM 3 Gender: Female Note Status: Finalized Procedure:             Colonoscopy Indications:           Follow-up of Crohn's disease of the small bowel and                         colon Providers:             Kamani Lewter B. Bonna Gains MD, MD Medicines:             Monitored Anesthesia Care Complications:         No immediate complications. Procedure:             Pre-Anesthesia Assessment:                        - Prior to the procedure, a History and Physical was                         performed, and patient medications, allergies and                         sensitivities were reviewed. The patient's tolerance                         of previous anesthesia was reviewed.                        - The risks and benefits of the procedure and the                         sedation options and risks were discussed with the                         patient. All questions were answered and informed                         consent was obtained.                        - Patient identification and proposed procedure were                         verified prior to the procedure by the physician, the                         nurse, the anesthesiologist, the anesthetist and the                         technician. The procedure was verified in the                         pre-procedure area in the procedure room in the                         endoscopy suite.                        -  Prophylactic Antibiotics: The patient does not                         require prophylactic antibiotics.                        - ASA Grade Assessment: II - A patient with mild                         systemic disease.                        - After reviewing the risks and benefits, the patient                          was deemed in satisfactory condition to undergo the                         procedure.                        - Monitored anesthesia care was determined to be                         medically necessary for this procedure based on review                         of the patient's medical history, medications, and                         prior anesthesia history.                        - The anesthesia plan was to use monitored anesthesia                         care (MAC).                        After obtaining informed consent, the colonoscope was                         passed under direct vision. Throughout the procedure,                         the patient's blood pressure, pulse, and oxygen                         saturations were monitored continuously. The                         Colonoscope was introduced through the anus and                         advanced to the the terminal ileum. The colonoscopy                         was performed with ease. The patient tolerated the  procedure well. The quality of the bowel preparation                         was good. Findings:      The perianal and digital rectal examinations were normal.      The ileocecal valve contained a moderate stenosis that was traversed.       Biopsies were taken with a cold forceps for histology. A stricture was       seen at Ileocecal valve. It was able to be intubated for a short       distance and the terminal ileum mucosa just proximal to it appeared       normal and was biopsied.      The mucosa vascular pattern in the cecum was decreased. Biopsies were       taken with a cold forceps for histology.      A 5 mm polyp was found in the sigmoid colon. The polyp was flat. The       polyp was removed with a cold snare. Resection and retrieval were       complete.      The rectum, sigmoid colon, descending colon, transverse colon and       ascending colon appeared normal.  Biopsies were taken with a cold forceps       for histology.      No additional abnormalities were found on retroflexion. Impression:            - Stricture at the ileocecal valve. Biopsied.                        - Decreased mucosa vascular pattern in the cecum.                         Biopsied.                        - One 5 mm polyp in the sigmoid colon, removed with a                         cold snare. Resected and retrieved.                        - The rectum, sigmoid colon, descending colon,                         transverse colon and ascending colon are normal.                         Biopsied. Recommendation:        - Await pathology results.                        - Obtain Humira Ab and drug levels (ordered). Further                         changes in treatment based on these labs, and                         pathology. Follow up in GI clinic closely within 1-2  weeks.                        - Continue present medications.                        - The findings and recommendations were discussed with                         the patient.                        - The findings and recommendations were discussed with                         the patient's family.                        - Avoid NSAIDs except Aspirin if medically indicated                         by PCP Procedure Code(s):     --- Professional ---                        [phone], Colonoscopy, flexible; with removal of                         tumor(s), polyp(s), or other lesion(s) by snare                         technique                        45380, 34, Colonoscopy, flexible; with biopsy, single                         or multiple Diagnosis Code(s):     --- Professional ---                        Z02.585, Crohn's disease of both small and large                         intestine with intestinal obstruction                        K63.5, Polyp of colon CPT copyright 2019 American Medical  Association. All rights reserved. The codes documented in this report are preliminary and upon coder review may  be revised to meet current compliance requirements.  [name], MD Margretta Sidle B. Bonna Gains MD, MD 03/27/2021 11:56:28 AM This report has been signed electronically. Number of Addenda: 0 Note Initiated On: 03/27/2021 10:57 AM Scope Withdrawal Time: 0 hours 21 minutes 11 seconds  Total Procedure Duration: 0 hours 26 minutes 41 seconds  Estimated Blood Loss:  Estimated blood loss: none.      [facility]

## 2021-03-28 ENCOUNTER — Telehealth: Payer: Self-pay

## 2021-03-28 ENCOUNTER — Encounter: Payer: Self-pay | Admitting: Gastroenterology

## 2021-03-28 NOTE — Telephone Encounter (Signed)
Pharmacy called stating that pt reached out to them telling them that she had been experiencing joint pain and swelling in legs and fingers and believes it is related to Humira Rx  Called pt no answer, Left detailed msg on VM per HIPAA  Please advise in the meantime, pt also instructed to send mychart msg as well with response

## 2021-03-29 ENCOUNTER — Other Ambulatory Visit: Payer: Self-pay | Admitting: Gastroenterology

## 2021-03-29 LAB — SURGICAL PATHOLOGY

## 2021-03-29 MED ORDER — PREDNISONE 10 MG PO TABS: 40.0000 mg | ORAL_TABLET | Freq: Every day | ORAL | 0 refills | Status: DC

## 2021-04-01 ENCOUNTER — Ambulatory Visit: Payer: Self-pay | Admitting: Family Medicine

## 2021-04-01 ENCOUNTER — Telehealth: Payer: Self-pay

## 2021-04-01 DIAGNOSIS — M2550 Pain in unspecified joint: Secondary | ICD-10-CM

## 2021-04-01 DIAGNOSIS — K5080 Crohn's disease of both small and large intestine without complications: Secondary | ICD-10-CM

## 2021-04-01 NOTE — Telephone Encounter (Signed)
Virgel Manifold, MD sent to Lurlean Nanny, CMA  Krisalyn Yankowski please refer her to rheumatology. I have discussed this with her as well.   I just spoke with the patient and given her colonoscopy findings with new stricture and the ileocecal valve I have started her on prednisone 40 mg a day. I will need to wait to see her Humira level to decide what we need to do with her meds. If she develops any fever/chills or any other acute symptoms of concern I have advised her to call us.   We will have her follow up in clinic closely once labs and pathology is back

## 2021-04-02 ENCOUNTER — Other Ambulatory Visit: Payer: Self-pay | Admitting: Gastroenterology

## 2021-04-02 LAB — COMPREHENSIVE METABOLIC PANEL
ALT: 15 IU/L (ref 0–32)
AST: 16 IU/L (ref 0–40)
Albumin/Globulin Ratio: 1.4 (ref 1.2–2.2)
Albumin: 4.3 g/dL (ref 3.8–4.8)
Alkaline Phosphatase: 62 IU/L (ref 44–121)
BUN/Creatinine Ratio: 5 — ABNORMAL LOW (ref 9–23)
BUN: 4 mg/dL — ABNORMAL LOW (ref 6–20)
Bilirubin Total: 0.3 mg/dL (ref 0.0–1.2)
CO2: 22 mmol/L (ref 20–29)
Calcium: 9.3 mg/dL (ref 8.7–10.2)
Chloride: 103 mmol/L (ref 96–106)
Creatinine, Ser: 0.82 mg/dL (ref 0.57–1.00)
Globulin, Total: 3.1 g/dL (ref 1.5–4.5)
Glucose: 101 mg/dL — ABNORMAL HIGH (ref 65–99)
Potassium: 4.6 mmol/L (ref 3.5–5.2)
Sodium: 144 mmol/L (ref 134–144)
Total Protein: 7.4 g/dL (ref 6.0–8.5)
eGFR: 93 mL/min/{1.73_m2} (ref >59)

## 2021-04-02 LAB — CBC
Hematocrit: 40.8 % (ref 34.0–46.6)
Hemoglobin: 13.4 g/dL (ref 11.1–15.9)
MCH: 31.2 pg (ref 26.6–33.0)
MCHC: 32.8 g/dL (ref 31.5–35.7)
MCV: 95 fL (ref 79–97)
Platelets: 364 10*3/uL (ref 150–450)
RBC: 4.3 x10E6/uL (ref 3.77–5.28)
RDW: 12.7 % (ref 11.7–15.4)
WBC: 9.6 10*3/uL (ref 3.4–10.8)

## 2021-04-02 LAB — SEDIMENTATION RATE: Sed Rate: 66 mm/hr — ABNORMAL HIGH (ref 0–32)

## 2021-04-02 LAB — ADALIMUMAB+AB (SERIAL MONITOR)
Adalimumab Drug Level: 7.1 ug/mL
Anti-Adalimumab Antibody: <25 ng/mL

## 2021-04-02 LAB — C-REACTIVE PROTEIN: CRP: 14 mg/L — ABNORMAL HIGH (ref 0–10)

## 2021-04-02 NOTE — Telephone Encounter (Signed)
Is the Dx just Chrohn's or also elevated sed rate?

## 2021-04-02 NOTE — Addendum Note (Signed)
Addended by: Lurlean Nanny on: 04/02/2021 09:53 AM   Modules accepted: Orders

## 2021-04-02 NOTE — Addendum Note (Signed)
Addended by: Lurlean Nanny on: 04/02/2021 04:52 PM   Modules accepted: Orders

## 2021-04-03 NOTE — Telephone Encounter (Signed)
Referral with notes has been faxed

## 2021-04-08 ENCOUNTER — Telehealth: Payer: Self-pay

## 2021-04-08 NOTE — Telephone Encounter (Signed)
Biologics referral form has been started and demographics, insurance card and med list has been printed as well... pt has an appt on 6/15 and will need a TB Quant done as her last TB was done in 02/2020- has been over 1 year

## 2021-04-08 NOTE — Telephone Encounter (Signed)
10/31/2018 mychart video visit  Patient has been compliant with Humira and Imuran

## 2021-04-09 ENCOUNTER — Other Ambulatory Visit: Payer: Self-pay | Admitting: Family Medicine

## 2021-04-09 NOTE — Telephone Encounter (Signed)
Scheduled 6/27

## 2021-04-09 NOTE — Telephone Encounter (Signed)
Form is on my desk along with insurance card and demographics

## 2021-04-09 NOTE — Telephone Encounter (Signed)
  Notes to clinic:  medication filled by historical provider  Review for refill    Requested Prescriptions  Pending Prescriptions Disp Refills   ADVAIR DISKUS 250-50 MCG/ACT AEPB [Pharmacy Med Name: ADVAIR 250-50 HALPFX] 90 each 3    Sig: TAKE 1 PUFF BY MOUTH TWICE A DAY      Pulmonology:  [employer] Passed - 04/09/2021 12:46 PM      Passed - Valid encounter within last 12 months    Recent Outpatient Visits           1 month ago Community acquired pneumonia of right lung, unspecified part of lung   [employer], [place], DO   1 month ago Exposure to the flu   [employer], Megan P, DO   2 months ago Community acquired pneumonia of right lung, unspecified part of lung   [employer], [place], DO   2 months ago Community acquired pneumonia of right lung, unspecified part of lung   [employer], [place], DO   4 months ago Viral illness   [place] Kathrine Haddock, NP       Future Appointments             Tomorrow Virgel Manifold, MD [place]   In 1 week End, Harrell Gave, MD [facility], [facility]   In 1 week [place], [name], DO [employer], [place]

## 2021-04-10 ENCOUNTER — Encounter: Payer: Self-pay | Admitting: Family Medicine

## 2021-04-10 ENCOUNTER — Other Ambulatory Visit: Payer: Self-pay

## 2021-04-10 ENCOUNTER — Ambulatory Visit: Payer: Medicaid Other | Admitting: Gastroenterology

## 2021-04-10 VITALS — BP 126/83 | HR 123 | Temp 98.4°F | Ht 64.0 in | Wt 214.2 lb

## 2021-04-10 DIAGNOSIS — K56699 Other intestinal obstruction unspecified as to partial versus complete obstruction: Secondary | ICD-10-CM

## 2021-04-10 DIAGNOSIS — E049 Nontoxic goiter, unspecified: Secondary | ICD-10-CM | POA: Diagnosis not present

## 2021-04-10 DIAGNOSIS — K5900 Constipation, unspecified: Secondary | ICD-10-CM | POA: Diagnosis not present

## 2021-04-10 DIAGNOSIS — K50818 Crohn's disease of both small and large intestine with other complication: Secondary | ICD-10-CM

## 2021-04-10 DIAGNOSIS — K5080 Crohn's disease of both small and large intestine without complications: Secondary | ICD-10-CM

## 2021-04-10 MED ORDER — PREDNISONE 5 MG PO TABS: 5.0000 mg | ORAL_TABLET | Freq: Every day | ORAL | 0 refills | Status: DC

## 2021-04-10 NOTE — Patient Instructions (Addendum)
Prednisone taper instructions: Starting 6/18 decrease prednisone to 30 mg (3 pills) once a day  Starting 6/25 decrease prednisone to 20 mg (2 pills) once a day Starting 7/2 decrease prednisone to 10 mg (1 pill once a day) Starting 7/9 decrease prednisone to 90m once a day and stop completely after 5 days

## 2021-04-10 NOTE — Progress Notes (Signed)
Cheryl Antigua, MD [address]  [place]  [place], [place] [address]  Main: [phone]  Fax: [phone]   Primary Care Physician: [name], DO   Chief Complaint  Patient presents with   Crohn's Disease    HPI: Cheryl Flowers is a 40 y.o. female here for follow-up of Crohn's disease.  Patient has been on prednisone for 12 days now given ileal stricture seen on her last colonoscopy.  [employer] approval process is underway, in the meantime patient is taking her Humira and Imuran.  Patient reports complete resolution of right lower quadrant abdominal pain since starting the prednisone.  Does report some abdominal bloating and constipation.  No emesis.  Passing flatus.  Patient did not take Humira for 2 months around February and March and did not discussed this with her when she decided to hold the medication herself.  However, given appropriate drug levels and undetectable humira antibodies it is appropriate to switch to a different class of medications at this time.    ROS: All ROS reviewed a negative except as per HPI   Past Medical History:  Diagnosis Date   Anxiety    Depression    Low back pain    Marital conflict    Panic disorder    Thyromegaly     Past Surgical History:  Procedure Laterality Date   CESAREAN SECTION  2003 and 2010   CHOLECYSTECTOMY  2007   COLONOSCOPY WITH PROPOFOL N/A 03/07/2020   Procedure: COLONOSCOPY WITH PROPOFOL;  Surgeon: Virgel Manifold, MD;  Location: [facility] ENDOSCOPY;  Service: Endoscopy;  Laterality: N/A;   COLONOSCOPY WITH PROPOFOL N/A 07/27/2020   Procedure: COLONOSCOPY WITH PROPOFOL;  Surgeon: Virgel Manifold, MD;  Location: [facility] ENDOSCOPY;  Service: Endoscopy;  Laterality: N/A;   COLONOSCOPY WITH PROPOFOL N/A 03/27/2021   Procedure: COLONOSCOPY WITH PROPOFOL;  Surgeon: Virgel Manifold, MD;  Location: [facility] ENDOSCOPY;  Service: Endoscopy;  Laterality: N/A;   TUBAL LIGATION  2010    Prior to  Admission medications   Medication Sig Start Date End Date Taking? Authorizing Provider  Adalimumab (HUMIRA PEN) 40 MG/0.4ML PNKT INJECT 1 PEN UNDER THE SKIN EVERY 14 DAYS. 12/12/20  Yes Virgel Manifold, MD  Adalimumab (HUMIRA PEN-CD/UC/HS STARTER) 80 MG/0.8ML PNKT Initation: Inject 149m SQ on day 1 then 867mon day 15. 09/13/20  Yes Kassidi Elza B, MD  ADVAIR DISKUS 250-50 MCG/DOSE AEPB Inhale 1 puff into the lungs 2 (two) times daily. 12/29/20  Yes [provider]  albuterol (VENTOLIN HFA) 108 (90 Base) MCG/ACT inhaler Inhale 1 puff into the lungs every 6 (six) hours as needed for wheezing or shortness of breath. 07/03/20  Yes Johnson, Megan P, DO  albuterol (VENTOLIN HFA) 108 (90 Base) MCG/ACT inhaler Inhale 2 puffs into the lungs every 6 (six) hours as needed for wheezing or shortness of breath. 03/26/21  Yes WaMartyn EhrichNP  amphetamine-dextroamphetamine (ADDERALL XR) 15 MG 24 hr capsule Take 1 capsule by mouth every morning. 04/06/21 05/06/21 Yes Johnson, Megan P, DO  azaTHIOprine (IMURAN) 50 MG tablet Take 1 tablet (50 mg total) by mouth daily. 10/31/20  Yes TaVirgel ManifoldMD  DULoxetine (CYMBALTA) 60 MG capsule Take 1 capsule (60 mg total) by mouth daily. 11/05/20  Yes Johnson, Megan P, DO  methocarbamol (ROBAXIN) 500 MG tablet Take 0.5-1 tablets (250-500 mg total) by mouth every 8 (eight) hours as needed for muscle spasms. 02/21/21  Yes Johnson, Megan P, DO  metoprolol succinate (  TOPROL-XL) 25 MG 24 hr tablet Take 1 tablet (25 mg total) by mouth daily. 07/03/20  Yes Johnson, Megan P, DO  norethindrone (AYGESTIN) 5 MG tablet Take 1 tablet by mouth daily. 06/04/20 06/04/21 Yes [provider]  norethindrone-ethinyl estradiol-iron (LOESTRIN FE) 1.5-30 MG-MCG tablet Take 1 tablet by mouth daily.   Yes [provider]  predniSONE (DELTASONE) 10 MG tablet Take 4 tablets (40 mg total) by mouth daily. 03/29/21  Yes Virgel Manifold, MD  predniSONE (DELTASONE) 5 MG  tablet Take 1 tablet (5 mg total) by mouth daily with breakfast for 5 days. 05/04/21 05/09/21 Yes Virgel Manifold, MD  QUEtiapine (SEROQUEL) 50 MG tablet Take 1-2 tablets (50-100 mg total) by mouth as needed. 11/05/20  Yes Johnson, Megan P, DO  amphetamine-dextroamphetamine (ADDERALL XR) 15 MG 24 hr capsule Take 1 capsule by mouth every morning. 02/05/21 03/07/21  Park Liter P, DO  amphetamine-dextroamphetamine (ADDERALL XR) 15 MG 24 hr capsule Take 1 capsule by mouth every morning. 03/07/21 04/06/21  [name], DO    Family History  Problem Relation Age of Onset   Hypertension Mother    Rheum arthritis Mother    Diabetes Father    Breast cancer Maternal Aunt        early 37   Crohn's disease Maternal Aunt      Social History   Tobacco Use   Smoking status: Former    Packs/day: 0.25    Years: 19.00    Pack years: 4.75    Types: Cigarettes    Quit date: 09/17/2020    Years since quitting: 0.5   Smokeless tobacco: Never  Vaping Use   Vaping Use: Former  Substance Use Topics   Alcohol use: Not Currently   Drug use: No    Allergies as of 04/10/2021 - Review Complete 04/10/2021  Allergen Reaction Noted   Morphine and related Itching 12/15/2015   Sulfa antibiotics Hives 12/15/2015    Physical Examination:   BP 126/83   Pulse (!) 123   Temp 98.4 F (36.9 C) (Oral)   Ht _0  (1.626 m)   Wt 214 lb 3.2 oz (97.2 kg)   BMI 36.77 kg/m   Constitutional: General:   Alert,  Well-developed, well-nourished, pleasant and cooperative in NAD BP 126/83   Pulse (!) 123   Temp 98.4 F (36.9 C) (Oral)   Ht _1  (1.626 m)   Wt 214 lb 3.2 oz (97.2 kg)   BMI 36.77 kg/m   Eyes:  Sclera clear, no icterus.   Conjunctiva pink. PERRLA  Ears:  No scars, lesions or masses, Normal auditory acuity. Nose:  No deformity, discharge, or lesions. Mouth:  No deformity or lesions, oropharynx pink & moist.  Neck:  Supple; no masses, possible enlarged non tender  thyroid.  Respiratory: Normal respiratory effort, Normal percussion  Gastrointestinal:  Normal bowel sounds.  No bruits.  Soft, non-tender and non-distended without masses, hepatosplenomegaly or hernias noted.  No guarding or rebound tenderness.     Cardiac: No clubbing or edema.  No cyanosis. Normal posterior tibial pedal pulses noted.  Lymphatic:  No significant cervical or axillary adenopathy.  Psych:  Alert and cooperative. Normal mood and affect.  Musculoskeletal:  Normal gait. Head normocephalic, atraumatic. Symmetrical without gross deformities. 5/5 Upper and Lower extremity strength bilaterally.  Skin: Warm. Intact without significant lesions or rashes. No jaundice.  Neurologic:  Face symmetrical, tongue midline, Normal sensation to touch;  grossly normal neurologically.  Psych:  Alert and  oriented x3, Alert and cooperative. Normal mood and affect.  Labs: CMP     Component Value Date/Time   NA 144 03/27/2021 1322   K 4.6 03/27/2021 1322   CL 103 03/27/2021 1322   CO2 22 03/27/2021 1322   GLUCOSE 101 (H) 03/27/2021 1322   GLUCOSE 120 (H) 01/09/2021 2031   BUN 4 (L) 03/27/2021 1322   CREATININE 0.82 03/27/2021 1322   CALCIUM 9.3 03/27/2021 1322   PROT 7.4 03/27/2021 1322   ALBUMIN 4.3 03/27/2021 1322   AST 16 03/27/2021 1322   ALT 15 03/27/2021 1322   ALKPHOS 62 03/27/2021 1322   BILITOT 0.3 03/27/2021 1322   GFRNONAA >60 01/09/2021 2031   GFRAA 102 10/16/2020 0905   Lab Results  Component Value Date   WBC 9.6 03/27/2021   HGB 13.4 03/27/2021   HCT 40.8 03/27/2021   MCV 95 03/27/2021   PLT 364 03/27/2021   ESR 66 on June 1 CRP 14 on June 1 Humira level 7.1 which is normal  Imaging Studies:    Assessment and Plan:   ADIRA LIMBURG is a 40 y.o. y/o female here for follow-up of ileocolonic Crohn's disease with ileal stricture on most recent colonoscopy  Findings on her colonoscopy showed improvement, but patient developed an ileal stricture  from self-discontinuing her Humira for 2 months.  Given appropriate drug levels and undetectable antibody levels, appropriate to switch to different class of drugs at this time.  Patient prefers IV infusion and options of medications were previously discussed with her as has been documented.  Will update TB Gold QuantiFERON at this time  [facility] approval process has been started and is pending at this time.  Patient will continue Mira and Imuran until Weyman Rodney is started, and at that point, we will switch over to [facility] and Imuran  Patient has been referred to rheumatology as well and has an upcoming appointment  Patient responded well to prednisone with complete resolution of right lower quadrant abdominal pain.  We will start prednisone taper and taper instructions written under patient instruction and given to her as follows:  Prednisone taper instructions: Starting 6/18 decrease prednisone to 30 mg (3 pills) once a day  Starting 6/25 decrease prednisone to 20 mg (2 pills) once a day Starting 7/2 decrease prednisone to 10 mg (1 pill once a day) Starting 7/9 decrease prednisone to 12m once a day and stop completely after 5 days  Patient is also having some constipation and has only had 2 bowel movements in the last week.  Denies any emesis or obstructive symptoms.  MiraLAX daily discussed and sample packets given.  Patient does states she has MiraLAX at home and has not taken it yet.  If constipation does not resolve in the next few days to week, or symptoms worsen, patient advised to call [place] and she verbalized understanding  Continue close follow-up in clinic  Importance of medication compliance and close follow-up discussed in detail to monitor disease process closely and prevent future worsening of disease  I have reached out to Dr. JWynetta Emerypatient's PCP to discuss if she can be seen in their clinic to evaluate thyroid function as her thyroid gland appears possibly enlarged on exam.  Pt advised to follow up with PCP in this regard as well  Dr VVonda Flowers

## 2021-04-11 ENCOUNTER — Other Ambulatory Visit: Payer: Self-pay | Admitting: Family Medicine

## 2021-04-11 ENCOUNTER — Telehealth: Payer: Self-pay | Admitting: *Deleted

## 2021-04-11 DIAGNOSIS — E049 Nontoxic goiter, unspecified: Secondary | ICD-10-CM

## 2021-04-11 NOTE — Telephone Encounter (Signed)
Filled out paper work for [employer] and fax to [facility]. Sharrie Rothman will be able to pull the quantiferon tb test from epic when it comes back. Email Sharrie Rothman to inform her it has been fax. Fax demographics, Labs, Last office visit notes, and Insurance.

## 2021-04-11 NOTE — Telephone Encounter (Signed)
Pt. Brought in FMLA form to be filled out by provider. Placed in [place] for review

## 2021-04-12 ENCOUNTER — Telehealth: Payer: Self-pay | Admitting: Gastroenterology

## 2021-04-12 NOTE — Telephone Encounter (Signed)
[facility] # [phone] Waunita Schooner (pharmacist) wants to know the frequency with labs for her  [facility]. Please call

## 2021-04-12 NOTE — Telephone Encounter (Signed)
Called and informed Cheryl Flowers the labs are with every infusion

## 2021-04-15 ENCOUNTER — Other Ambulatory Visit: Payer: Self-pay | Admitting: Gastroenterology

## 2021-04-15 LAB — QUANTIFERON-TB GOLD PLUS
QuantiFERON Mitogen Value: >10 IU/mL
QuantiFERON Nil Value: 0.03 IU/mL
QuantiFERON TB1 Ag Value: 0 IU/mL
QuantiFERON TB2 Ag Value: 0 IU/mL
QuantiFERON-TB Gold Plus: NEGATIVE

## 2021-04-17 ENCOUNTER — Ambulatory Visit (INDEPENDENT_AMBULATORY_CARE_PROVIDER_SITE_OTHER): Payer: Medicaid Other

## 2021-04-17 ENCOUNTER — Encounter: Payer: Self-pay | Admitting: Internal Medicine

## 2021-04-17 ENCOUNTER — Ambulatory Visit: Payer: Medicaid Other | Admitting: Internal Medicine

## 2021-04-17 ENCOUNTER — Other Ambulatory Visit: Payer: Self-pay

## 2021-04-17 VITALS — BP 135/93 | HR 111 | Ht 64.0 in | Wt 213.0 lb

## 2021-04-17 DIAGNOSIS — E049 Nontoxic goiter, unspecified: Secondary | ICD-10-CM | POA: Diagnosis not present

## 2021-04-17 DIAGNOSIS — R079 Chest pain, unspecified: Secondary | ICD-10-CM | POA: Diagnosis not present

## 2021-04-17 DIAGNOSIS — R002 Palpitations: Secondary | ICD-10-CM

## 2021-04-17 DIAGNOSIS — R000 Tachycardia, unspecified: Secondary | ICD-10-CM

## 2021-04-17 DIAGNOSIS — R0602 Shortness of breath: Secondary | ICD-10-CM | POA: Diagnosis not present

## 2021-04-17 DIAGNOSIS — R55 Syncope and collapse: Secondary | ICD-10-CM

## 2021-04-17 NOTE — Patient Instructions (Addendum)
Medication Instructions:  Your physician recommends that you continue on your current medications as directed. Please refer to the Current Medication list given to you today.  *If you need a refill on your cardiac medications before your next appointment, please call your pharmacy*   Lab Work:  Today: TSH, Free T4  If you have labs (blood work) drawn today and your tests are completely normal, you will receive your results only by: [place] (if you have MyChart) OR A paper copy in the mail If you have any lab test that is abnormal or we need to change your treatment, we will call you to review the results.   Testing/Procedures:  Your physician has recommended that you wear a Zio XT monitor for TWO WEEKS.   This monitor is a medical device that records the heart's electrical activity. Doctors most often use these monitors to diagnose arrhythmias. Arrhythmias are problems with the speed or rhythm of the heartbeat. The monitor is a small device applied to your chest. You can wear one while you do your normal daily activities. While wearing this monitor if you have any symptoms to push the button and record what you felt. Once you have worn this monitor for the period of time provider prescribed (Usually 14 days), you will return the monitor device in the postage paid box. Once it is returned they will download the data collected and provide [place] with a report which the provider will then review and we will call you with those results. Important tips:  Avoid showering during the first 24 hours of wearing the monitor. Avoid excessive sweating to help maximize wear time. Do not submerge the device, no hot tubs, and no swimming pools. Keep any lotions or oils away from the patch. After 24 hours you may shower with the patch on. Take brief showers with your back facing the shower head.  Do not remove patch once it has been placed because that will interrupt data and decrease adhesive wear  time. Push the button when you have any symptoms and write down what you were feeling. Once you have completed wearing your monitor, remove and place into box which has postage paid and place in your outgoing mailbox.  If for some reason you have misplaced your box then call our office and we can provide another box and/or mail it off for you.    Follow-Up: At [facility], you and your health needs are our priority.  As part of our continuing mission to provide you with exceptional heart care, we have created designated Provider Care Teams.  These Care Teams include your primary Cardiologist (physician) and Advanced Practice Providers (APPs -  Physician Assistants and Nurse Practitioners) who all work together to provide you with the care you need, when you need it.  We recommend signing up for the patient portal called "MyChart".  Sign up information is provided on this After Visit Summary.  MyChart is used to connect with patients for Virtual Visits (Telemedicine).  Patients are able to view lab/test results, encounter notes, upcoming appointments, etc.  Non-urgent messages can be sent to your provider as well.   To learn more about what you can do with MyChart, go to [email or web address].    Your next appointment:   1 month(s)  The format for your next appointment:   In Person  Provider:   You may see Dr. Harrell Gave End or one of the following Advanced Practice Providers on your designated Care Team:   Harrell Gave  Sharolyn Douglas, NP Christell Faith, PA-C Marrianne Mood, PA-C Cadence [place], PA-C Laurann [place], NP

## 2021-04-17 NOTE — Progress Notes (Signed)
New Outpatient Visit Date: 04/17/2021  Referring Provider: Martyn Ehrich, NP [address],  [place] [address]  Chief Complaint: Palpitations and chest pain  HPI:  Cheryl Flowers is a 40 y.o. female who is being seen today for the evaluation of tachycardia at the request of Ms. Volanda Napoleon. She has a history of hypertension, Crohn's disease, asthma, and obesity.  She was seen in the pulmonary clinic last month and reported elevated heart rates, dizziness, and shortness of breath over the last 2 years.  Patient was advised to increase metoprolol succinate to 50 mg daily.  Echocardiogram is pending.  Holter monitor in 2020 showed normal sinus rhythm with intermittent sinus tachycardia and a few PVCs.  Average heart rate was 92 bpm.  Today, the patient reports that she has experienced several different symptoms for a few years including headaches, dizziness, palpitations, chest pain, and shortness of breath.  Palpitations have been more severe over the last few months.  She reports an episode of tachycardia with associated lightheadedness that occurred after she was working in her garden.  When she came in and sat down on the couch, her heart rate was 150 bpm.  On Valentine's Day, patient reports an episode of syncope.  She was experiencing pain in her thumb and had walked to the sink to place her hand under warm water.  The next thing she remembers is being helped up by her husband, who had found her on the floor.  At the time, she was experiencing cold sweats as well as abdominal pain with the sensation of needing to have a bowel movement.  She has felt lightheaded again since then but has not passed out.  Patient also reports intermittent chest pain and shortness of breath with minimal activity such as showering.  She describes the chest discomfort as a tightness without radiation.  This is not clearly associated with the aforementioned palpitations, though the chest pain and dyspnea  also worsened after her syncopal episode in February.  She has tried cutting down on caffeine and also briefly stopped taking Adderall, though this did not help her aforementioned symptoms.  Currently, the patient is on a prednisone taper for management of her IBD.  --------------------------------------------------------------------------------------------------  Cardiovascular History & Procedures: Cardiovascular Problems: Tachycardia Chest pain Shortness of breath Syncope  Risk Factors: Hypertension and obesity  Cath/PCI: None  CV Surgery: None  EP Procedures and Devices: 48-hour Holter monitor (04/11/2019): Normal sinus rhythm with intermittent sinus tachycardia (average heart rate 92 bpm).  Few PVCs observed.  Patient triggered events without significant arrhythmia.  Non-Invasive Evaluation(s): None  Recent CV Pertinent Labs: Lab Results  Component Value Date   CHOL 154 04/05/2020   HDL 45 04/05/2020   LDLCALC 93 04/05/2020   TRIG 86 04/05/2020   BNP <2.5 04/05/2019   K 4.6 03/27/2021   BUN 4 (L) 03/27/2021   CREATININE 0.82 03/27/2021    --------------------------------------------------------------------------------------------------  Past Medical History:  Diagnosis Date   Anxiety    Depression    Low back pain    Marital conflict    Panic disorder    Thyromegaly     Past Surgical History:  Procedure Laterality Date   CESAREAN SECTION  2003 and 2010   CHOLECYSTECTOMY  2007   COLONOSCOPY WITH PROPOFOL N/A 03/07/2020   Procedure: COLONOSCOPY WITH PROPOFOL;  Surgeon: Virgel Manifold, MD;  Location: [facility]COPY;  Service: Endoscopy;  Laterality: N/A;   COLONOSCOPY WITH PROPOFOL N/A 07/27/2020   Procedure:  COLONOSCOPY WITH PROPOFOL;  Surgeon: Virgel Manifold, MD;  Location: [facility] ENDOSCOPY;  Service: Endoscopy;  Laterality: N/A;   COLONOSCOPY WITH PROPOFOL N/A 03/27/2021   Procedure: COLONOSCOPY WITH PROPOFOL;  Surgeon: Virgel Manifold, MD;   Location: [facility] ENDOSCOPY;  Service: Endoscopy;  Laterality: N/A;   TUBAL LIGATION  2010    Current Meds  Medication Sig   Adalimumab (HUMIRA PEN) 40 MG/0.4ML PNKT INJECT 1 PEN UNDER THE SKIN EVERY 14 DAYS.   albuterol (VENTOLIN HFA) 108 (90 Base) MCG/ACT inhaler Inhale 2 puffs into the lungs every 6 (six) hours as needed for wheezing or shortness of breath.   amphetamine-dextroamphetamine (ADDERALL XR) 15 MG 24 hr capsule Take 1 capsule by mouth every morning.   azaTHIOprine (IMURAN) 50 MG tablet Take 1 tablet (50 mg total) by mouth daily.   DULoxetine (CYMBALTA) 60 MG capsule Take 1 capsule (60 mg total) by mouth daily.   fluticasone-salmeterol (ADVAIR DISKUS) 250-50 MCG/ACT AEPB TAKE 1 PUFF BY MOUTH TWICE A DAY   methocarbamol (ROBAXIN) 500 MG tablet Take 0.5-1 tablets (250-500 mg total) by mouth every 8 (eight) hours as needed for muscle spasms.   metoprolol succinate (TOPROL-XL) 25 MG 24 hr tablet Take 1 tablet (25 mg total) by mouth daily.   norethindrone (AYGESTIN) 5 MG tablet Take 1 tablet by mouth daily.   predniSONE (DELTASONE) 10 MG tablet Take 30 mg by mouth daily with breakfast.   QUEtiapine (SEROQUEL) 50 MG tablet Take 1-2 tablets (50-100 mg total) by mouth as needed.    Allergies: Morphine and related and Sulfa antibiotics  Social History   Tobacco Use   Smoking status: Former    Packs/day: 0.25    Years: 19.00    Pack years: 4.75    Types: Cigarettes    Quit date: 09/17/2020    Years since quitting: 0.5   Smokeless tobacco: Never  Vaping Use   Vaping Use: Former  Substance Use Topics   Alcohol use: Not Currently   Drug use: No    Family History  Problem Relation Age of Onset   Hypertension Mother    Rheum arthritis Mother    Diabetes Father    Breast cancer Maternal Aunt        early 30   Crohn's disease Maternal Aunt     Review of Systems: A 12-system review of systems was performed and was negative except as noted in the  HPI.  --------------------------------------------------------------------------------------------------  Physical Exam: BP (!) 135/93 (BP Location: Right Arm, Patient Position: Sitting, Cuff Size: Large)   Pulse (!) 111   Ht 5' 4"  (1.626 m)   Wt 213 lb (96.6 kg)   SpO2 98%   BMI 36.56 kg/m   General: NAD. HEENT: No conjunctival pallor or scleral icterus. Facemask in place. Neck: Supple without lymphadenopathy, JVD, or HJR. No carotid bruit.  Thyromegaly without obvious thyroid nodule noted. Lungs: Normal work of breathing. Clear to auscultation bilaterally without wheezes or crackles. Heart: Tachycardic but regular without murmurs, rubs, or gallops. Non-displaced PMI. Abd: Bowel sounds present. Soft, NT/ND without hepatosplenomegaly Ext: No lower extremity edema. Radial, PT, and DP pulses are 2+ bilaterally Skin: Warm and dry without rash. Neuro: CNIII-XII intact. Strength and fine-touch sensation intact in upper and lower extremities bilaterally. Psych: Normal mood and affect.  EKG: Sinus tachycardia (heart rate 111 bpm).  Otherwise, no abnormality.  Lab Results  Component Value Date   WBC 9.6 03/27/2021   HGB 13.4 03/27/2021   HCT 40.8 03/27/2021  MCV 95 03/27/2021   PLT 364 03/27/2021    Lab Results  Component Value Date   NA 144 03/27/2021   K 4.6 03/27/2021   CL 103 03/27/2021   CO2 22 03/27/2021   BUN 4 (L) 03/27/2021   CREATININE 0.82 03/27/2021   GLUCOSE 101 (H) 03/27/2021   ALT 15 03/27/2021    Lab Results  Component Value Date   CHOL 154 04/05/2020   HDL 45 04/05/2020   LDLCALC 93 04/05/2020   TRIG 86 04/05/2020    --------------------------------------------------------------------------------------------------  ASSESSMENT AND PLAN: Palpitations, tachycardia, and syncope: Palpitations and elevated heart rates have been present for several years, with Holter monitor in 2020 demonstrating sinus rhythm and sinus tachycardia with an average heart  rate in the 90s.  Things seem to have worsened after a syncopal episode in February.  EKG today demonstrates sinus tachycardia but otherwise no significant abnormality.  Examination is also unrevealing.  It is possible that some of the patient's medications are contributing to her symptoms, specifically Adderall and Seroquel as well as prednisone.  I have recommended obtaining a 14-day event monitor to exclude arrhythmia and proceeding with the previously ordered echocardiogram.  If predominantly sinus tachycardia is identified, further work-up for secondary causes of sinus tachycardia will need to be pursued.  I will also check thyroid function studies today.  If Adderall and/or Seroquel can be reduced or discontinued, that may be helpful.  I have advised the patient to avoid driving or operating heavy machinery for at least 6 months from her most recent syncopal episode pending further work-up.  Chest pain and shortness of breath: Symptoms are atypical in timing.  Patient is young, making ischemic heart disease less likely.  We will need to begin with an echocardiogram and event monitor, as outlined above.  If symptoms persist, noninvasive ischemia imaging may need to be considered.  I am not sure that she would be able to exercise well for exercise tolerance testing.  Additionally, her elevated heart rates would make coronary CTA challenging as well.  We will readdress this after completion of the event monitor and echocardiogram.  Follow-up: Return to clinic in 1 month.  Nelva Bush, MD 04/17/2021 8:56 AM

## 2021-04-18 ENCOUNTER — Encounter: Payer: Self-pay | Admitting: Internal Medicine

## 2021-04-18 DIAGNOSIS — R002 Palpitations: Secondary | ICD-10-CM | POA: Insufficient documentation

## 2021-04-18 DIAGNOSIS — R079 Chest pain, unspecified: Secondary | ICD-10-CM | POA: Insufficient documentation

## 2021-04-18 DIAGNOSIS — R0602 Shortness of breath: Secondary | ICD-10-CM | POA: Insufficient documentation

## 2021-04-18 LAB — T4, FREE: Free T4: 1.33 ng/dL (ref 0.82–1.77)

## 2021-04-18 LAB — TSH: TSH: 0.525 u[IU]/mL (ref 0.450–4.500)

## 2021-04-22 ENCOUNTER — Other Ambulatory Visit: Payer: Self-pay

## 2021-04-22 ENCOUNTER — Encounter: Payer: Self-pay | Admitting: Family Medicine

## 2021-04-22 ENCOUNTER — Ambulatory Visit: Payer: Medicaid Other | Admitting: Family Medicine

## 2021-04-22 VITALS — BP 124/79 | HR 102 | Temp 98.6°F | Ht 64.0 in | Wt 214.6 lb

## 2021-04-22 DIAGNOSIS — F988 Other specified behavioral and emotional disorders with onset usually occurring in childhood and adolescence: Secondary | ICD-10-CM

## 2021-04-22 DIAGNOSIS — F331 Major depressive disorder, recurrent, moderate: Secondary | ICD-10-CM | POA: Diagnosis not present

## 2021-04-22 DIAGNOSIS — M5441 Lumbago with sciatica, right side: Secondary | ICD-10-CM

## 2021-04-22 MED ORDER — QUETIAPINE FUMARATE ER 50 MG PO TB24: 50.0000 mg | ORAL_TABLET | Freq: Every day | ORAL | 1 refills | Status: DC

## 2021-04-22 MED ORDER — METOPROLOL SUCCINATE ER 25 MG PO TB24: 25.0000 mg | ORAL_TABLET | Freq: Every day | ORAL | 0 refills | Status: DC

## 2021-04-22 MED ORDER — AMPHETAMINE-DEXTROAMPHET ER 15 MG PO CP24
15.0000 mg | ORAL_CAPSULE | ORAL | 0 refills | Status: DC
Start: 2021-06-05 — End: 2021-09-26

## 2021-04-22 MED ORDER — DULOXETINE HCL 60 MG PO CPEP: 60.0000 mg | ORAL_CAPSULE | Freq: Every day | ORAL | 1 refills | Status: DC

## 2021-04-22 MED ORDER — PREDNISONE 10 MG PO TABS: ORAL_TABLET | ORAL | 0 refills | Status: DC

## 2021-04-22 MED ORDER — AMPHETAMINE-DEXTROAMPHET ER 15 MG PO CP24: 15.0000 mg | ORAL_CAPSULE | ORAL | 0 refills | Status: DC

## 2021-04-22 MED ORDER — METHOCARBAMOL 500 MG PO TABS: 250.0000 mg | ORAL_TABLET | Freq: Three times a day (TID) | ORAL | 1 refills | Status: DC | PRN

## 2021-04-22 NOTE — Assessment & Plan Note (Signed)
Under good control on current regimen. Continue current regimen. Continue to monitor. Call with any concerns. Refills given for 3 months. Follow up in 3 months.

## 2021-04-22 NOTE — Progress Notes (Signed)
BP 124/79   Pulse (!) 102   Temp 98.6 F (37 C) (Oral)   Ht 5' 4"  (1.626 m)   Wt 214 lb 9.6 oz (97.3 kg)   SpO2 99%   BMI 36.84 kg/m    Subjective:    Patient ID: Cheryl Flowers, female    DOB: Oct 12, 1981, 40 y.o.   MRN: [id number]  HPI: Cheryl Flowers is a 40 y.o. female  Chief Complaint  Patient presents with   ADD    3 month f/up   Pain    Pt states she has been having R lower back and hip pain for the past month. States the pain is worse when up walking or sitting for to long    Depression   ADHD FOLLOW UP ADHD status: uncontrolled Satisfied with current therapy: no Medication compliance:  excellent compliance Controlled substance contract: yes Previous psychiatry evaluation: no Previous medications: no    Taking meds on weekends/vacations: yes Work/school performance:  average Difficulty sustaining attention/completing tasks: yes Distracted by extraneous stimuli: no Does not listen when spoken to: no  Fidgets with hands or feet: no Unable to stay in seat: no Blurts out/interrupts others: no ADHD Medication Side Effects: no    Decreased appetite: no    Headache: no    Sleeping disturbance pattern: no    Irritability: no    Rebound effects (worse than baseline) off medication: no    Anxiousness: no    Dizziness: no    Tics: no  DEPRESSION Mood status: exacerbated Satisfied with current treatment?: no Symptom severity: moderate  Duration of current treatment : chronic Side effects: no Medication compliance: excellent compliance Psychotherapy/counseling: no  Previous psychiatric medications: cymbalta, seroquel Depressed mood: yes Anxious mood: yes Anhedonia: no Significant weight loss or gain: no Insomnia: no  Fatigue: yes Feelings of worthlessness or guilt: yes Impaired concentration/indecisiveness: yes Suicidal ideations: no Hopelessness: yes Crying spells: yes Depression screen [facility] 2/9 04/22/2021 11/05/2020 04/05/2020  02/29/2020 01/17/2020  Decreased Interest 3 0 1 3 1   Down, Depressed, Hopeless 3 1 1 3  0  PHQ - 2 Score 6 1 2 6 1   Altered sleeping 3 1 2 2 2   Tired, decreased energy 3 0 2 2 2   Change in appetite 1 0 1 2 0  Feeling bad or failure about yourself  1 0 0 3 0  Trouble concentrating 1 1 2 2 3   Moving slowly or fidgety/restless 0 0 0 1 0  Suicidal thoughts 1 0 0 2 0  PHQ-9 Score 16 3 9 20 8   Difficult doing work/chores Extremely dIfficult Not difficult at all Very difficult Very difficult -  Some recent data might be hidden     BACK PAIN Duration: about a month Mechanism of injury: unknown Location: Right and low back Onset: sudden Severity: 7/10 Quality: dull ache Frequency: intermittent Radiation: R leg above the knee Aggravating factors: movement, walking, laying, bending, and prolonged sitting Alleviating factors: laying Status: stable Treatments attempted: heat  Relief with NSAIDs?: No NSAIDs Taken Nighttime pain:  yes Paresthesias / decreased sensation:  no Bowel / bladder incontinence:  no Fevers:  yes Dysuria / urinary frequency:  no   Relevant past medical, surgical, family and social history reviewed and updated as indicated. Interim medical history since our last visit reviewed. Allergies and medications reviewed and updated.  Review of Systems  Constitutional: Negative.   Respiratory: Negative.    Cardiovascular: Negative.   Gastrointestinal: Negative.   Musculoskeletal:  Positive for  back pain and myalgias. Negative for arthralgias, gait problem, joint swelling, neck pain and neck stiffness.  Skin: Negative.   Neurological: Negative.   Psychiatric/Behavioral:  Positive for decreased concentration and dysphoric mood. Negative for agitation, behavioral problems, confusion, hallucinations, self-injury, sleep disturbance and suicidal ideas. The patient is nervous/anxious. The patient is not hyperactive.    Per HPI unless specifically indicated above      Objective:    BP 124/79   Pulse (!) 102   Temp 98.6 F (37 C) (Oral)   Ht 5' 4"  (1.626 m)   Wt 214 lb 9.6 oz (97.3 kg)   SpO2 99%   BMI 36.84 kg/m   Wt Readings from Last 3 Encounters:  04/22/21 214 lb 9.6 oz (97.3 kg)  04/17/21 213 lb (96.6 kg)  04/10/21 214 lb 3.2 oz (97.2 kg)    Physical Exam Vitals and nursing note reviewed.  Constitutional:      General: She is not in acute distress.    Appearance: Normal appearance. She is not ill-appearing, toxic-appearing or diaphoretic.  HENT:     Head: Normocephalic and atraumatic.     Right Ear: External ear normal.     Left Ear: External ear normal.     Nose: Nose normal.     Mouth/Throat:     Mouth: Mucous membranes are moist.     Pharynx: Oropharynx is clear.  Eyes:     General: No scleral icterus.       Right eye: No discharge.        Left eye: No discharge.     Extraocular Movements: Extraocular movements intact.     Conjunctiva/sclera: Conjunctivae normal.     Pupils: Pupils are equal, round, and reactive to light.  Cardiovascular:     Rate and Rhythm: Normal rate and regular rhythm.     Pulses: Normal pulses.     Heart sounds: Normal heart sounds. No murmur heard.   No friction rub. No gallop.  Pulmonary:     Effort: Pulmonary effort is normal. No respiratory distress.     Breath sounds: Normal breath sounds. No stridor. No wheezing, rhonchi or rales.  Chest:     Chest wall: No tenderness.  Musculoskeletal:        General: Normal range of motion.     Cervical back: Normal range of motion and neck supple.  Skin:    General: Skin is warm and dry.     Capillary Refill: Capillary refill takes less than 2 seconds.     Coloration: Skin is not jaundiced or pale.     Findings: No bruising, erythema, lesion or rash.  Neurological:     General: No focal deficit present.     Mental Status: She is alert and oriented to person, place, and time. Mental status is at baseline.  Psychiatric:        Mood and Affect: Mood  normal.        Behavior: Behavior normal.        Thought Content: Thought content normal.        Judgment: Judgment normal.    Results for orders placed or performed in visit on 04/17/21  TSH  Result Value Ref Range   TSH 0.525 0.450 - 4.500 uIU/mL  T4, free  Result Value Ref Range   Free T4 1.33 0.82 - 1.77 ng/dL      Assessment & Plan:   Problem List Items Addressed This Visit       Nervous and Auditory  Low back pain with right-sided sciatica - Primary   Relevant Medications   predniSONE (DELTASONE) 10 MG tablet   methocarbamol (ROBAXIN) 500 MG tablet   QUEtiapine (SEROQUEL XR) 50 MG TB24 24 hr tablet   DULoxetine (CYMBALTA) 60 MG capsule   amphetamine-dextroamphetamine (ADDERALL XR) 15 MG 24 hr capsule (Start on 05/06/2021)   amphetamine-dextroamphetamine (ADDERALL XR) 15 MG 24 hr capsule (Start on 06/05/2021)   amphetamine-dextroamphetamine (ADDERALL XR) 15 MG 24 hr capsule (Start on 07/05/2021)   Other Relevant Orders   DG Lumbar Spine Complete   DG Hip Unilat W OR W/O Pelvis 2-3 Views Right     Other   Attention deficit disorder (ADD) in adult    Under good control on current regimen. Continue current regimen. Continue to monitor. Call with any concerns. Refills given for 3 months. Follow up in 3 months.         Moderate episode of recurrent major depressive disorder ([place])    Not doing well. Will change her seroquel to XR and continue current dose- recheck 1 month. Call with any concerns.        Relevant Medications   DULoxetine (CYMBALTA) 60 MG capsule     Follow up plan: Return in about 4 weeks (around 05/20/2021) for physical.

## 2021-04-22 NOTE — Assessment & Plan Note (Signed)
Not doing well. Will change her seroquel to XR and continue current dose- recheck 1 month. Call with any concerns.

## 2021-04-25 ENCOUNTER — Telehealth: Payer: Self-pay

## 2021-04-25 DIAGNOSIS — K5080 Crohn's disease of both small and large intestine without complications: Secondary | ICD-10-CM | POA: Diagnosis not present

## 2021-04-25 NOTE — Telephone Encounter (Signed)
[facility] infusion went to the patient house today for the first infusion. The have the lab orders but need to know how often you want to labs done.  Please fax orders to [phone]

## 2021-04-25 NOTE — Telephone Encounter (Signed)
PA for Seroquel initiated and submitted via Cover My Meds. Key: [id number]

## 2021-04-26 ENCOUNTER — Other Ambulatory Visit: Payer: Self-pay | Admitting: Primary Care

## 2021-04-26 DIAGNOSIS — R000 Tachycardia, unspecified: Secondary | ICD-10-CM

## 2021-04-26 NOTE — Telephone Encounter (Signed)
PA approved. Called and LVM notifying patient of approval.

## 2021-04-30 NOTE — Telephone Encounter (Signed)
Pt had labs completed June 2022 that were listed on the [facility] form... email sent to [facility] requesting who to contact in reference to this as phone number or contact number was not given.Marland KitchenMarland Kitchen

## 2021-05-02 ENCOUNTER — Ambulatory Visit
Admission: RE | Admit: 2021-05-02 | Discharge: 2021-05-02 | Disposition: A | Discharge status: Home / Self Care | Payer: Medicaid Other | Attending: Family Medicine | Admitting: Family Medicine

## 2021-05-02 ENCOUNTER — Ambulatory Visit
Admission: RE | Admit: 2021-05-02 | Discharge: 2021-05-02 | Disposition: A | Discharge status: Home / Self Care | Payer: Medicaid Other | Source: Ambulatory Visit | Attending: Family Medicine | Admitting: Family Medicine

## 2021-05-02 ENCOUNTER — Other Ambulatory Visit: Payer: Self-pay

## 2021-05-02 DIAGNOSIS — M5441 Lumbago with sciatica, right side: Secondary | ICD-10-CM | POA: Insufficient documentation

## 2021-05-02 DIAGNOSIS — M25551 Pain in right hip: Secondary | ICD-10-CM | POA: Diagnosis not present

## 2021-05-02 DIAGNOSIS — M461 Sacroiliitis, not elsewhere classified: Secondary | ICD-10-CM | POA: Diagnosis not present

## 2021-05-02 DIAGNOSIS — M4328 Fusion of spine, sacral and sacrococcygeal region: Secondary | ICD-10-CM | POA: Diagnosis not present

## 2021-05-02 DIAGNOSIS — M545 Low back pain, unspecified: Secondary | ICD-10-CM | POA: Diagnosis not present

## 2021-05-03 ENCOUNTER — Other Ambulatory Visit: Payer: Self-pay | Admitting: Family Medicine

## 2021-05-03 DIAGNOSIS — M461 Sacroiliitis, not elsewhere classified: Secondary | ICD-10-CM

## 2021-05-03 NOTE — Telephone Encounter (Signed)
Osie Cheeks <[email or web address]> Olam Idler!     I pulled our notes and one of our pharmacists kristy had called but I forwarded on the frequency so we are good to go ??  Thanks so much

## 2021-05-07 DIAGNOSIS — Z79899 Other long term (current) drug therapy: Secondary | ICD-10-CM | POA: Diagnosis not present

## 2021-05-07 DIAGNOSIS — K5090 Crohn's disease, unspecified, without complications: Secondary | ICD-10-CM | POA: Insufficient documentation

## 2021-05-07 DIAGNOSIS — K5080 Crohn's disease of both small and large intestine without complications: Secondary | ICD-10-CM | POA: Diagnosis not present

## 2021-05-07 DIAGNOSIS — M0760 Enteropathic arthropathies, unspecified site: Secondary | ICD-10-CM | POA: Diagnosis not present

## 2021-05-07 DIAGNOSIS — M461 Sacroiliitis, not elsewhere classified: Secondary | ICD-10-CM | POA: Diagnosis not present

## 2021-05-07 HISTORY — DX: Crohn's disease, unspecified, without complications: K50.90

## 2021-05-09 ENCOUNTER — Other Ambulatory Visit: Payer: Self-pay

## 2021-05-09 ENCOUNTER — Ambulatory Visit (INDEPENDENT_AMBULATORY_CARE_PROVIDER_SITE_OTHER): Payer: Medicaid Other

## 2021-05-09 DIAGNOSIS — R000 Tachycardia, unspecified: Secondary | ICD-10-CM

## 2021-05-09 DIAGNOSIS — R42 Dizziness and giddiness: Secondary | ICD-10-CM

## 2021-05-09 LAB — ECHOCARDIOGRAM COMPLETE
AR max vel: 2.96 cm2
AV Area VTI: 3 cm2
AV Area mean vel: 2.76 cm2
AV Mean grad: 3 mmHg
AV Peak grad: 6 mmHg
Ao pk vel: 1.22 m/s
Area-P 1/2: 3.95 cm2
Calc EF: 52.3 %
Single Plane A2C EF: 53 %
Single Plane A4C EF: 54.3 %

## 2021-05-10 ENCOUNTER — Encounter: Payer: Self-pay | Admitting: Family Medicine

## 2021-05-10 DIAGNOSIS — E875 Hyperkalemia: Secondary | ICD-10-CM

## 2021-05-21 NOTE — Telephone Encounter (Signed)
Please call patient and ask if anyone followed up on this. Her Potassium was VERY high. I would like her to come in now to get it rechecked. Order in.

## 2021-05-22 ENCOUNTER — Other Ambulatory Visit: Payer: Medicaid Other

## 2021-05-22 ENCOUNTER — Other Ambulatory Visit: Payer: Self-pay

## 2021-05-22 DIAGNOSIS — E875 Hyperkalemia: Secondary | ICD-10-CM | POA: Diagnosis not present

## 2021-05-22 DIAGNOSIS — E049 Nontoxic goiter, unspecified: Secondary | ICD-10-CM | POA: Diagnosis not present

## 2021-05-23 ENCOUNTER — Ambulatory Visit: Payer: Medicaid Other | Admitting: Internal Medicine

## 2021-05-23 LAB — BASIC METABOLIC PANEL
BUN/Creatinine Ratio: 8 — ABNORMAL LOW (ref 9–23)
BUN: 8 mg/dL (ref 6–20)
CO2: 22 mmol/L (ref 20–29)
Calcium: 9.2 mg/dL (ref 8.7–10.2)
Chloride: 102 mmol/L (ref 96–106)
Creatinine, Ser: 1 mg/dL (ref 0.57–1.00)
Glucose: 100 mg/dL — ABNORMAL HIGH (ref 65–99)
Potassium: 4.2 mmol/L (ref 3.5–5.2)
Sodium: 142 mmol/L (ref 134–144)
eGFR: 73 mL/min/{1.73_m2} (ref >59)

## 2021-05-23 LAB — THYROID PANEL
Free Thyroxine Index: 2.1 (ref 1.2–4.9)
T3 Uptake Ratio: 25 % (ref 24–39)
T4, Total: 8.5 ug/dL (ref 4.5–12.0)

## 2021-05-24 ENCOUNTER — Encounter: Payer: Medicaid Other | Admitting: Family Medicine

## 2021-05-24 ENCOUNTER — Encounter: Payer: Self-pay | Admitting: Family Medicine

## 2021-05-27 DIAGNOSIS — F410 Panic disorder [episodic paroxysmal anxiety] without agoraphobia: Secondary | ICD-10-CM

## 2021-05-27 DIAGNOSIS — F411 Generalized anxiety disorder: Secondary | ICD-10-CM

## 2021-05-27 DIAGNOSIS — F988 Other specified behavioral and emotional disorders with onset usually occurring in childhood and adolescence: Secondary | ICD-10-CM

## 2021-05-27 DIAGNOSIS — F331 Major depressive disorder, recurrent, moderate: Secondary | ICD-10-CM

## 2021-05-28 ENCOUNTER — Other Ambulatory Visit: Payer: Self-pay | Admitting: Gastroenterology

## 2021-05-31 NOTE — Telephone Encounter (Signed)
Can we see about re-opening referral? Let me know if it needs to be replaced

## 2021-06-03 ENCOUNTER — Encounter: Payer: Medicaid Other | Admitting: Family Medicine

## 2021-06-03 ENCOUNTER — Encounter: Payer: Self-pay | Admitting: Family Medicine

## 2021-06-07 ENCOUNTER — Telehealth: Payer: Medicaid Other | Admitting: Family Medicine

## 2021-06-07 ENCOUNTER — Other Ambulatory Visit: Payer: Self-pay

## 2021-06-07 VITALS — BP 123/80 | HR 117 | Temp 98.4°F | Wt 214.0 lb

## 2021-06-07 DIAGNOSIS — U071 COVID-19: Secondary | ICD-10-CM

## 2021-06-07 MED ORDER — HYDROCOD POLST-CPM POLST ER 10-8 MG/5ML PO SUER: 5.0000 mL | Freq: Two times a day (BID) | ORAL | 0 refills | Status: DC | PRN

## 2021-06-07 MED ORDER — BENZONATATE 200 MG PO CAPS: 200.0000 mg | ORAL_CAPSULE | Freq: Two times a day (BID) | ORAL | 0 refills | Status: DC | PRN

## 2021-06-07 NOTE — Progress Notes (Signed)
BP 123/80   Pulse (!) 117   Temp 98.4 F (36.9 C) (Oral)   Wt 214 lb (97.1 kg)   SpO2 99%   BMI 36.73 kg/m    Subjective:    Patient ID: Cheryl Flowers, female    DOB: [birth year], 40 y.o.   MRN: [id number]  HPI: Cheryl Flowers is a 40 y.o. female  Chief Complaint  Patient presents with   Covid Positive    Pt states she tested positive this past Sunday for Covid and symptoms started Saturday. States she still has a cough and congestion. Had body aches, fever, and headache when symptoms first started.    UPPER RESPIRATORY TRACT INFECTION Duration: 6 days Worst symptom: body aches Fever: no, chills and sweats Cough: yes Shortness of breath: yes Wheezing: no Chest pain: no Chest tightness: yes Chest congestion: yes Nasal congestion: yes Runny nose: yes Post nasal drip: yes Sneezing: no Sore throat: no Swollen glands: no Sinus pressure: yes Headache: yes Face pain: no Toothache: no Ear pain: no  Ear pressure: no  Eyes red/itching:no Eye drainage/crusting: no  Vomiting: no Rash: no Fatigue: yes Sick contacts: yes Strep contacts: no  Context: better Recurrent sinusitis: no Relief with OTC cold/cough medications: no  Treatments attempted: cold/sinus, mucinex, anti-histamine, pseudoephedrine, and cough syrup    Relevant past medical, surgical, family and social history reviewed and updated as indicated. Interim medical history since our last visit reviewed. Allergies and medications reviewed and updated.  Review of Systems  Constitutional:  Positive for chills, diaphoresis, fatigue and fever. Negative for activity change, appetite change and unexpected weight change.  HENT:  Positive for congestion, postnasal drip, rhinorrhea, sinus pressure, sneezing and sore throat. Negative for dental problem, drooling, ear discharge, ear pain, facial swelling, hearing loss, mouth sores, nosebleeds, sinus pain, tinnitus, trouble swallowing and voice change.    Eyes: Negative.   Respiratory:  Positive for cough and chest tightness. Negative for apnea, choking, shortness of breath, wheezing and stridor.   Cardiovascular: Negative.   Gastrointestinal: Negative.   Musculoskeletal: Negative.   Psychiatric/Behavioral: Negative.     Per HPI unless specifically indicated above     Objective:    BP 123/80   Pulse (!) 117   Temp 98.4 F (36.9 C) (Oral)   Wt 214 lb (97.1 kg)   SpO2 99%   BMI 36.73 kg/m   Wt Readings from Last 3 Encounters:  06/07/21 214 lb (97.1 kg)  04/22/21 214 lb 9.6 oz (97.3 kg)  04/17/21 213 lb (96.6 kg)    Physical Exam Vitals and nursing note reviewed.  Pulmonary:     Effort: Pulmonary effort is normal. No respiratory distress.     Comments: Speaking in full sentences Neurological:     Mental Status: She is alert.  Psychiatric:        Mood and Affect: Mood normal.        Behavior: Behavior normal.        Thought Content: Thought content normal.        Judgment: Judgment normal.    Results for orders placed or performed in visit on 72/53/66  Basic metabolic panel  Result Value Ref Range   Glucose 100 (H) 65 - 99 mg/dL   BUN 8 6 - 20 mg/dL   Creatinine, Ser 1.00 0.57 - 1.00 mg/dL   eGFR 73 >59 mL/min/1.73   BUN/Creatinine Ratio 8 (L) 9 - 23   Sodium 142 134 - 144 mmol/L   Potassium 4.2 3.5 -  5.2 mmol/L   Chloride 102 96 - 106 mmol/L   CO2 22 20 - 29 mmol/L   Calcium 9.2 8.7 - 10.2 mg/dL  Thyroid Profile([facility])  Result Value Ref Range   T4, Total 8.5 4.5 - 12.0 ug/dL   T3 Uptake Ratio 25 24 - 39 %   Free Thyroxine Index 2.1 1.2 - 4.9      Assessment & Plan:   Problem List Items Addressed This Visit   None Visit Diagnoses     COVID-19    -  Primary   Outside the range for antivirals. Feeling better. Will treat with tussionex and tessalon. Call if not getting better or getting worse. Continue to monitor.         Follow up plan: Return if symptoms worsen or fail to  improve.   This visit was completed via telephone due to the restrictions of the COVID-19 pandemic. All issues as above were discussed and addressed but no physical exam was performed. If it was felt that the patient should be evaluated in the office, they were directed there. The patient verbally consented to this visit. Patient was unable to complete an audio/visual visit due to Technical difficulties. Due to the catastrophic nature of the COVID-19 pandemic, this visit was done through audio contact only. Location of the patient: home Location of the provider: work Those involved with this call:  Provider: Park Liter, DO CMA: Yvonna Alanis, CMA Front Desk/Registration: Roe Rutherford  Time spent on call:  21 minutes on the phone discussing health concerns. 30 minutes total spent in review of patient's record and preparation of their chart.

## 2021-06-14 DIAGNOSIS — K5080 Crohn's disease of both small and large intestine without complications: Secondary | ICD-10-CM | POA: Diagnosis not present

## 2021-06-21 ENCOUNTER — Encounter: Payer: Self-pay | Admitting: Family Medicine

## 2021-06-21 NOTE — Telephone Encounter (Signed)
Can we please change referral to [facility]?

## 2021-06-28 DIAGNOSIS — M25561 Pain in right knee: Secondary | ICD-10-CM | POA: Diagnosis not present

## 2021-06-28 DIAGNOSIS — M545 Low back pain, unspecified: Secondary | ICD-10-CM | POA: Diagnosis not present

## 2021-06-28 DIAGNOSIS — M542 Cervicalgia: Secondary | ICD-10-CM | POA: Diagnosis not present

## 2021-07-04 ENCOUNTER — Other Ambulatory Visit: Payer: Self-pay

## 2021-07-04 ENCOUNTER — Ambulatory Visit: Payer: Medicaid Other | Admitting: Gastroenterology

## 2021-07-04 ENCOUNTER — Encounter: Payer: Self-pay | Admitting: Gastroenterology

## 2021-07-04 ENCOUNTER — Ambulatory Visit
Admission: RE | Admit: 2021-07-04 | Discharge: 2021-07-04 | Disposition: A | Discharge status: Home / Self Care | Payer: Medicaid Other | Source: Ambulatory Visit | Attending: Gastroenterology | Admitting: Gastroenterology

## 2021-07-04 VITALS — BP 134/87 | HR 110 | Temp 98.7°F | Ht 64.0 in | Wt 218.0 lb

## 2021-07-04 DIAGNOSIS — K5080 Crohn's disease of both small and large intestine without complications: Secondary | ICD-10-CM

## 2021-07-04 DIAGNOSIS — R079 Chest pain, unspecified: Secondary | ICD-10-CM | POA: Diagnosis not present

## 2021-07-04 DIAGNOSIS — R059 Cough, unspecified: Secondary | ICD-10-CM | POA: Insufficient documentation

## 2021-07-05 DIAGNOSIS — K529 Noninfective gastroenteritis and colitis, unspecified: Secondary | ICD-10-CM

## 2021-07-05 LAB — COMPREHENSIVE METABOLIC PANEL
ALT: 16 IU/L (ref 0–32)
AST: 14 IU/L (ref 0–40)
Albumin/Globulin Ratio: 1.6 (ref 1.2–2.2)
Albumin: 4.5 g/dL (ref 3.8–4.8)
Alkaline Phosphatase: 66 IU/L (ref 44–121)
BUN/Creatinine Ratio: 7 — ABNORMAL LOW (ref 9–23)
BUN: 6 mg/dL (ref 6–20)
Bilirubin Total: 0.3 mg/dL (ref 0.0–1.2)
CO2: 22 mmol/L (ref 20–29)
Calcium: 9.2 mg/dL (ref 8.7–10.2)
Chloride: 109 mmol/L — ABNORMAL HIGH (ref 96–106)
Creatinine, Ser: 0.92 mg/dL (ref 0.57–1.00)
Globulin, Total: 2.9 g/dL (ref 1.5–4.5)
Glucose: 82 mg/dL (ref 65–99)
Potassium: 4.5 mmol/L (ref 3.5–5.2)
Sodium: 144 mmol/L (ref 134–144)
Total Protein: 7.4 g/dL (ref 6.0–8.5)
eGFR: 81 mL/min/{1.73_m2} (ref >59)

## 2021-07-05 LAB — CBC
Hematocrit: 38.6 % (ref 34.0–46.6)
Hemoglobin: 13.8 g/dL (ref 11.1–15.9)
MCH: 32.7 pg (ref 26.6–33.0)
MCHC: 35.8 g/dL — ABNORMAL HIGH (ref 31.5–35.7)
MCV: 92 fL (ref 79–97)
Platelets: 339 10*3/uL (ref 150–450)
RBC: 4.22 x10E6/uL (ref 3.77–5.28)
RDW: 12.1 % (ref 11.7–15.4)
WBC: 10 10*3/uL (ref 3.4–10.8)

## 2021-07-05 LAB — SEDIMENTATION RATE: Sed Rate: 69 mm/hr — ABNORMAL HIGH (ref 0–32)

## 2021-07-05 LAB — C-REACTIVE PROTEIN: CRP: 17 mg/L — ABNORMAL HIGH (ref 0–10)

## 2021-07-05 LAB — HEPATITIS B SURFACE ANTIBODY,QUALITATIVE: Hep B Surface Ab, Qual: REACTIVE

## 2021-07-05 NOTE — Progress Notes (Signed)
Cheryl Antigua, MD [address]  [place]  [place], [place] [address]  Main: [phone]  Fax: [phone]   Primary Care Physician: [name], DO   Chief Complaint  Patient presents with   Follow-up    Pt reports headache, dizziness, off balance, skin changes, joint pain, mood swings and fatigue are more intense and frequent since starting Entyvio    HPI: ALGA SOUTHALL is a 40 y.o. female here for follow-up of Crohn's disease.  Patient states that she completed her initial infusions of Entyvio, with the next one scheduled in October.  Patient and husband state that at baseline she had dizziness even before the [facility], but feels that this may have worsened with Entyvio.  However, she does report that her abdominal pain in the right lower quadrant has significantly improved since starting Entyvio.  Is reporting 1-2 bowel movements a day, but reports constipation and is having to take 1 stool softener daily, and takes MiraLAX as needed.  With this will strain with bowel movements intermittently.  No blood in stool.  No fever or chills, but has been having night sweats.  Patient also reports a dry cough.  States was positive for COVID about a month ago.  Patient is on Imuran, managed by rheumatology.  Notes by Dr. Posey Pronto, from May 07, 2021 reviewed in [facility].  They have diagnosed her with sacroiliitis, and arthritis and Crohn's disease, as cause of her symptoms.  They have increased her Imuran to 75 mg twice a day, and recommend that she speak to Dr. Sharlet Salina regarding right SI joint injection under fluoroscopy due to sacroiliitis.  Her most recent labs have shown hyperkalemia and she states that the 2 labs that showed hyperkalemia were done by the home infusion staff, and repeat done at her PCP office did not show hyperkalemia.  Previous history: Patient is now on Entyvio since July 2022  June 2022 colonoscopy Stricture at ileocecal valve Decreased  vascularity in the cecum 5 mm sigmoid polyp removed  DIAGNOSIS:  A. TERMINAL ILEUM; COLD BIOPSY:  - MILD CHRONIC ACTIVE ENTERITIS CONSISTENT WITH PATIENT'S HISTORY OF  CROHN'S DISEASE.  - NEGATIVE FOR DYSPLASIA AND MALIGNANCY.   B.  COLON, CECUM AND ASCENDING; COLD BIOPSY:  - MILD CHRONIC ACTIVE COLITIS CONSISTENT WITH PATIENT'S KNOWN HISTORY OF  CROHN'S.  - NEGATIVE FOR DYSPLASIA AND MALIGNANCY.   C.  COLON, TRANSVERSE; COLD BIOPSY:  - PATCHY CHRONIC INACTIVE COLITIS CONSISTENT WITH PATIENT'S HISTORY OF  CROHN'S.  - NEGATIVE FOR DYSPLASIA AND MALIGNANCY.   D.  COLON, DESCENDING; COLD BIOPSY:  - PATCHY CHRONIC INACTIVE COLITIS CONSISTENT WITH PATIENT'S HISTORY OF  CROHN'S.  - NEGATIVE FOR DYSPLASIA AND MALIGNANCY.   E.  COLON, RECTOSIGMOID; COLD BIOPSY:  - PATCHY CHRONIC INACTIVE COLITIS CONSISTENT WITH PATIENT'S HISTORY OF  CROHN'S.  - NEGATIVE FOR DYSPLASIA AND MALIGNANCY.    F.  COLON POLYP, SIGMOID; COLD SNARE:  - HYPERPLASTIC POLYP.  - NEGATIVE FOR DYSPLASIA AND MALIGNANCY.   Humira start date: 09/21/2020.  Discontinued due to ileal stricture on Humira Imuran start date: 09/26/2020   Most recent fecal calprotectin has significantly improved from before   colonoscopy October 2021 Impression:            - The examined portion of the ileum was normal.                         Biopsied.                        -  Crohn's disease. Inflammation was found in the                         transverse colon, in the ascending colon and at the                         cecum. This was moderate in severity, worsened                         compared to previous examinations. Biopsied.                        - The distal rectum and anal verge are normal on                         retroflexion view.   Index colonoscopy May 2021 Findings:     - Nonbleeding ulcerated mucosa with no stigmata of recent bleeding were       present at the ileocecal valve.     - Terminal ileum could not be  intubated, due to ulceration       present just proximal to the Ileocecal valve    - Patchy moderate inflammation characterized by altered vascularity,       erythema and shallow ulcerations was found in the cecum.     - Patchy mild inflammation characterized by erythema and shallow       ulcerations was found in the transverse colon and in the ascending       colon.     - The descending colon appeared normal.    -  A patchy area of mildly erythematous mucosa was found in the rectum and       in the sigmoid colon.    There did not appear to be a stricture in the terminal ileum, but rather just inflammation, and therefore it was not intubated to avoid complications such as perforation.   Pathology:  A. COLON, CECUM; COLD BIOPSY:  - CHRONIC COLITIS WITH FOCAL MILD ACTIVITY.  - NEGATIVE FOR DYSPLASIA AND MALIGNANCY.   B. ILEOCECAL VALVE; COLD BIOPSY:  - CHRONIC ENTERITIS / COLITIS WITH MODERATE ACTIVITY AND ULCERATION.  - NEGATIVE FOR DYSPLASIA AND MALIGNANCY.   C. COLON, ASCENDING; COLD BIOPSY:  - CHRONIC COLITIS WITH MODERATE ACTIVITY.  - NEGATIVE FOR DYSPLASIA AND MALIGNANCY.   D. COLON, TRANSVERSE; COLD BIOPSY:  - CHRONIC COLITIS WITH MODERATE ACTIVITY.  - NEGATIVE FOR DYSPLASIA AND MALIGNANCY.   E. COLON, DESCENDING; COLD BIOPSY:  - CHRONIC INACTIVE COLITIS.  - NEGATIVE FOR DYSPLASIA AND MALIGNANCY.   F. COLON, RECTOSIGMOID; COLD BIOPSY:  - CHRONIC INACTIVE COLITIS.  - NEGATIVE FOR DYSPLASIA AND MALIGNANCY.    CDAI score was 120.  MRE was obtained and showed mild wall thickening and mucosal enhancement involving the distal ileum and cecum.     Based on CDAI score and colonoscopy findings, patient was considered low risk and budesonide was initially started.  Due to endoscopic disease despite budesonide, therapy was changed   Gold QuantiFERON is negative.  TPMT is normal.  Patient is hep B immune based on blood work.  Immunizations: Hep B series completed Influenza  up-to-date Pneumovax completed 2019  Health maintenance: Patient was previously referred to dermatology for annual skin exam and was advised to follow-up with them on a yearly basis  Follow-up with GYN for regular Pap  smears   ROS: All ROS reviewed and negative except as per HPI   Past Medical History:  Diagnosis Date   Anxiety    Depression    Low back pain    Marital conflict    Panic disorder    Thyromegaly     Past Surgical History:  Procedure Laterality Date   CESAREAN SECTION  2003 and 2010   CHOLECYSTECTOMY  2007   COLONOSCOPY WITH PROPOFOL N/A 03/07/2020   Procedure: COLONOSCOPY WITH PROPOFOL;  Surgeon: Virgel Manifold, MD;  Location: [facility] ENDOSCOPY;  Service: Endoscopy;  Laterality: N/A;   COLONOSCOPY WITH PROPOFOL N/A 07/27/2020   Procedure: COLONOSCOPY WITH PROPOFOL;  Surgeon: Virgel Manifold, MD;  Location: [facility] ENDOSCOPY;  Service: Endoscopy;  Laterality: N/A;   COLONOSCOPY WITH PROPOFOL N/A 03/27/2021   Procedure: COLONOSCOPY WITH PROPOFOL;  Surgeon: Virgel Manifold, MD;  Location: [facility] ENDOSCOPY;  Service: Endoscopy;  Laterality: N/A;   TUBAL LIGATION  2010    Prior to Admission medications   Medication Sig Start Date End Date Taking? Authorizing Provider  albuterol (VENTOLIN HFA) 108 (90 Base) MCG/ACT inhaler Inhale 2 puffs into the lungs every 6 (six) hours as needed for wheezing or shortness of breath. 03/26/21  Yes Martyn Ehrich, NP  amphetamine-dextroamphetamine (ADDERALL XR) 15 MG 24 hr capsule Take 1 capsule by mouth every morning. 06/05/21 07/05/21 Yes Johnson, Megan P, DO  amphetamine-dextroamphetamine (ADDERALL XR) 15 MG 24 hr capsule Take 1 capsule by mouth every morning. 07/05/21 08/04/21 Yes Johnson, Megan P, DO  azaTHIOprine (IMURAN) 50 MG tablet TAKE 1 TABLET BY MOUTH EVERY DAY 05/29/21  Yes Cheryl Flowers B, MD  DULoxetine (CYMBALTA) 60 MG capsule Take 1 capsule (60 mg total) by mouth daily. 04/22/21  Yes Johnson, Megan P, DO   methocarbamol (ROBAXIN) 500 MG tablet Take 0.5-1 tablets (250-500 mg total) by mouth every 8 (eight) hours as needed for muscle spasms. 04/22/21  Yes Johnson, Megan P, DO  metoprolol succinate (TOPROL-XL) 25 MG 24 hr tablet Take 1 tablet (25 mg total) by mouth daily. 04/22/21  Yes Johnson, Megan P, DO  QUEtiapine (SEROQUEL XR) 50 MG TB24 24 hr tablet Take 1-2 tablets (50-100 mg total) by mouth at bedtime. 04/22/21  Yes Johnson, Megan P, DO  amphetamine-dextroamphetamine (ADDERALL XR) 15 MG 24 hr capsule Take 1 capsule by mouth every morning. 05/06/21 06/05/21  Park Liter P, DO  norethindrone (AYGESTIN) 5 MG tablet Take 1 tablet by mouth daily. 06/04/20 06/04/21  [provider]    Family History  Problem Relation Age of Onset   Heart disease Mother    Hypertension Mother    Rheum arthritis Mother    Ankylosing spondylitis Mother    Hypertension Father    Diabetes Father    Breast cancer Maternal Aunt        early 48   Crohn's disease Maternal Aunt    Heart attack Maternal Grandmother    Stroke Maternal Grandfather      Social History   Tobacco Use   Smoking status: Former    Packs/day: 0.25    Years: 19.00    Pack years: 4.75    Types: Cigarettes    Quit date: 09/17/2020    Years since quitting: 0.7   Smokeless tobacco: Never  Vaping Use   Vaping Use: Former  Substance Use Topics   Alcohol use: Not Currently   Drug use: No    Allergies as of 07/04/2021 - Review Complete 07/04/2021  Allergen Reaction Noted  Morphine and related Itching 12/15/2015   Sulfa antibiotics Hives 12/15/2015    Physical Examination:  Constitutional: General:   Alert,  Well-developed, well-nourished, pleasant and cooperative in NAD BP 134/87   Pulse (!) 110   Temp 98.7 F (37.1 C) (Oral)   Ht 5' 4" (1.626 m)   Wt 218 lb (98.9 kg)   BMI 37.42 kg/m   Respiratory: Normal respiratory effort  Gastrointestinal:  Soft, non-tender and non-distended without masses, hepatosplenomegaly  or hernias noted.  No guarding or rebound tenderness.     Cardiac: No clubbing or edema.  No cyanosis. Normal posterior tibial pedal pulses noted.  Psych:  Alert and cooperative. Normal mood and affect.  Musculoskeletal:  Normal gait. Head normocephalic, atraumatic. Symmetrical without gross deformities. 5/5 Lower extremity strength bilaterally.  Skin: Warm. Intact without significant lesions or rashes. No jaundice.  Neck: Supple, trachea midline  Lymph: No cervical lymphadenopathy  Psych:  Alert and oriented x3, Alert and cooperative. Normal mood and affect.  Labs:  June 14, 2021 labs CMP Potassium 6.2 AST 25, ALT 19, alk phos 48, bilirubin 0.3  CBC Hemoglobin 12.8, WBC 7, platelets 338  Imaging Studies:   Assessment and Plan:   CYDNIE DEASON is a 40 y.o. 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  TB Gold Quant from testing was negative on April 10, 2021.  Patient also advised to call PCP in regard to her night sweats.  I have messaged her PCP in this regard as well.  Her hep B surface antibody was nonreactive last year, will recheck given that she has previously been immunized for hepatitis B.  She is afebrile.  Would not recommend changing treatment at this time as her abdominal pain has overall improved with Entyvio  Continue close follow-up with rheumatology as well  We will follow-up with patient after labs are available  Patient will likely need repeat colonoscopy in 3 months after starting Entyvio and this was discussed with her as well   Dr Cheryl Flowers

## 2021-07-17 DIAGNOSIS — F17200 Nicotine dependence, unspecified, uncomplicated: Secondary | ICD-10-CM | POA: Diagnosis not present

## 2021-07-17 DIAGNOSIS — F332 Major depressive disorder, recurrent severe without psychotic features: Secondary | ICD-10-CM | POA: Diagnosis not present

## 2021-07-17 DIAGNOSIS — F4310 Post-traumatic stress disorder, unspecified: Secondary | ICD-10-CM | POA: Diagnosis not present

## 2021-07-17 DIAGNOSIS — F411 Generalized anxiety disorder: Secondary | ICD-10-CM | POA: Diagnosis not present

## 2021-07-18 ENCOUNTER — Encounter: Payer: Self-pay | Admitting: Family Medicine

## 2021-07-18 DIAGNOSIS — K5010 Crohn's disease of large intestine without complications: Secondary | ICD-10-CM

## 2021-07-25 DIAGNOSIS — F4310 Post-traumatic stress disorder, unspecified: Secondary | ICD-10-CM | POA: Diagnosis not present

## 2021-07-25 DIAGNOSIS — F332 Major depressive disorder, recurrent severe without psychotic features: Secondary | ICD-10-CM | POA: Diagnosis not present

## 2021-07-25 DIAGNOSIS — F411 Generalized anxiety disorder: Secondary | ICD-10-CM | POA: Diagnosis not present

## 2021-07-25 DIAGNOSIS — F17200 Nicotine dependence, unspecified, uncomplicated: Secondary | ICD-10-CM | POA: Diagnosis not present

## 2021-07-31 ENCOUNTER — Encounter: Payer: Self-pay | Admitting: Family Medicine

## 2021-07-31 ENCOUNTER — Encounter: Payer: Medicaid Other | Admitting: Family Medicine

## 2021-07-31 NOTE — Telephone Encounter (Signed)
Pt rescheduled appt to 09/09/2021

## 2021-07-31 NOTE — Telephone Encounter (Signed)
Lmom asking pt to call back to reschedule appt.

## 2021-08-01 ENCOUNTER — Encounter: Payer: Self-pay | Admitting: Gastroenterology

## 2021-08-01 ENCOUNTER — Telehealth (INDEPENDENT_AMBULATORY_CARE_PROVIDER_SITE_OTHER): Payer: Medicaid Other | Admitting: Gastroenterology

## 2021-08-01 DIAGNOSIS — K5080 Crohn's disease of both small and large intestine without complications: Secondary | ICD-10-CM | POA: Diagnosis not present

## 2021-08-01 NOTE — Progress Notes (Addendum)
Cheryl Antigua, MD [address]  [place]  [place], [place] [address]  Main: [phone]  Fax: [phone]   Primary Care Physician: [name], DO  Virtual Visit via Video Note  I connected with patient on 08/02/21 at  3:00 PM EDT by video and verified that I am speaking with the correct person using two identifiers.   I discussed the limitations, risks, security and privacy concerns of performing an evaluation and management service by video and the availability of in person appointments. I also discussed with the patient that there may be a patient responsible charge related to this service. The patient expressed understanding and agreed to proceed.  Location of Patient: Home Location of Provider: Home Persons involved: Patient and provider only (Nursing staff checked in patient via phone but were not physically involved in the video interaction - see their notes)   History of Present Illness: Chief Complaint  Patient presents with   Follow-up  Crohns Disease  Primary Care Physician: [name], DO  Chief complaint: Crohn's disease   HPI: Cheryl Flowers is a 40 y.o. female here for follow-up of ileocolonic Crohn's disease.  Patient has been on Entyvio since July 2022 and is on Imuran per rheumatology.  However, she reports increased nausea symptoms associated with headaches since being on Imuran.  Is reporting 1-2 soft bowel movements a day.  No blood in stool.  States abdominal pain and bowel movements are better since being on Entyvio.  She also describes mood changes and is questioning if these are coming from [facility]  Previous history: Patient is now on Entyvio since July 2022   June 2022 colonoscopy Stricture at ileocecal valve Decreased vascularity in the cecum 5 mm sigmoid polyp removed  DIAGNOSIS:  A. TERMINAL ILEUM; COLD BIOPSY:  - MILD CHRONIC ACTIVE ENTERITIS CONSISTENT WITH PATIENT'S HISTORY OF  CROHN'S DISEASE.  -  NEGATIVE FOR DYSPLASIA AND MALIGNANCY.   B.  COLON, CECUM AND ASCENDING; COLD BIOPSY:  - MILD CHRONIC ACTIVE COLITIS CONSISTENT WITH PATIENT'S KNOWN HISTORY OF  CROHN'S.  - NEGATIVE FOR DYSPLASIA AND MALIGNANCY.   C.  COLON, TRANSVERSE; COLD BIOPSY:  - PATCHY CHRONIC INACTIVE COLITIS CONSISTENT WITH PATIENT'S HISTORY OF  CROHN'S.  - NEGATIVE FOR DYSPLASIA AND MALIGNANCY.   D.  COLON, DESCENDING; COLD BIOPSY:  - PATCHY CHRONIC INACTIVE COLITIS CONSISTENT WITH PATIENT'S HISTORY OF  CROHN'S.  - NEGATIVE FOR DYSPLASIA AND MALIGNANCY.   E.  COLON, RECTOSIGMOID; COLD BIOPSY:  - PATCHY CHRONIC INACTIVE COLITIS CONSISTENT WITH PATIENT'S HISTORY OF  CROHN'S.  - NEGATIVE FOR DYSPLASIA AND MALIGNANCY.    F.  COLON POLYP, SIGMOID; COLD SNARE:  - HYPERPLASTIC POLYP.  - NEGATIVE FOR DYSPLASIA AND MALIGNANCY.    Humira start date: 09/21/2020.  Discontinued due to ileal stricture on Humira Imuran start date: 09/26/2020   Most recent fecal calprotectin has significantly improved from before   colonoscopy October 2021 Impression:            - The examined portion of the ileum was normal.                         Biopsied.                        - Crohn's disease. Inflammation was found in the  transverse colon, in the ascending colon and at the                         cecum. This was moderate in severity, worsened                         compared to previous examinations. Biopsied.                        - The distal rectum and anal verge are normal on                         retroflexion view.   Index colonoscopy May 2021 Findings:     - Nonbleeding ulcerated mucosa with no stigmata of recent bleeding were       present at the ileocecal valve.     - Terminal ileum could not be intubated, due to ulceration       present just proximal to the Ileocecal valve    - Patchy moderate inflammation characterized by altered vascularity,       erythema and shallow ulcerations  was found in the cecum.     - Patchy mild inflammation characterized by erythema and shallow       ulcerations was found in the transverse colon and in the ascending       colon.     - The descending colon appeared normal.    -  A patchy area of mildly erythematous mucosa was found in the rectum and       in the sigmoid colon.    There did not appear to be a stricture in the terminal ileum, but rather just inflammation, and therefore it was not intubated to avoid complications such as perforation.   Pathology:  A. COLON, CECUM; COLD BIOPSY:  - CHRONIC COLITIS WITH FOCAL MILD ACTIVITY.  - NEGATIVE FOR DYSPLASIA AND MALIGNANCY.   B. ILEOCECAL VALVE; COLD BIOPSY:  - CHRONIC ENTERITIS / COLITIS WITH MODERATE ACTIVITY AND ULCERATION.  - NEGATIVE FOR DYSPLASIA AND MALIGNANCY.   C. COLON, ASCENDING; COLD BIOPSY:  - CHRONIC COLITIS WITH MODERATE ACTIVITY.  - NEGATIVE FOR DYSPLASIA AND MALIGNANCY.   D. COLON, TRANSVERSE; COLD BIOPSY:  - CHRONIC COLITIS WITH MODERATE ACTIVITY.  - NEGATIVE FOR DYSPLASIA AND MALIGNANCY.   E. COLON, DESCENDING; COLD BIOPSY:  - CHRONIC INACTIVE COLITIS.  - NEGATIVE FOR DYSPLASIA AND MALIGNANCY.   F. COLON, RECTOSIGMOID; COLD BIOPSY:  - CHRONIC INACTIVE COLITIS.  - NEGATIVE FOR DYSPLASIA AND MALIGNANCY.    CDAI score was 120.  MRE was obtained and showed mild wall thickening and mucosal enhancement involving the distal ileum and cecum.     Based on CDAI score and colonoscopy findings, patient was considered low risk and budesonide was initially started.  Due to endoscopic disease despite budesonide, therapy was changed   Gold QuantiFERON is negative.  TPMT is normal.  Patient is hep B immune based on blood work.   Immunizations: Hep B series completed Influenza up-to-date Pneumovax completed 2019   Health maintenance: Patient was previously referred to dermatology for annual skin exam and was advised to follow-up with them on a yearly basis    Follow-up with GYN for regular Pap smears   ROS: All ROS reviewed and negative except as per HPI   Past Medical History:  Diagnosis Date   Anxiety    Depression  Low back pain    Marital conflict    Panic disorder    Thyromegaly     Past Surgical History:  Procedure Laterality Date   CESAREAN SECTION  2003 and 2010   CHOLECYSTECTOMY  2007   COLONOSCOPY WITH PROPOFOL N/A 03/07/2020   Procedure: COLONOSCOPY WITH PROPOFOL;  Surgeon: Virgel Manifold, MD;  Location: [facility] ENDOSCOPY;  Service: Endoscopy;  Laterality: N/A;   COLONOSCOPY WITH PROPOFOL N/A 07/27/2020   Procedure: COLONOSCOPY WITH PROPOFOL;  Surgeon: Virgel Manifold, MD;  Location: [facility] ENDOSCOPY;  Service: Endoscopy;  Laterality: N/A;   COLONOSCOPY WITH PROPOFOL N/A 03/27/2021   Procedure: COLONOSCOPY WITH PROPOFOL;  Surgeon: Virgel Manifold, MD;  Location: [facility] ENDOSCOPY;  Service: Endoscopy;  Laterality: N/A;   TUBAL LIGATION  2010    Prior to Admission medications   Medication Sig Start Date End Date Taking? Authorizing Provider  albuterol (VENTOLIN HFA) 108 (90 Base) MCG/ACT inhaler Inhale 2 puffs into the lungs every 6 (six) hours as needed for wheezing or shortness of breath. 03/26/21  Yes Martyn Ehrich, NP  amphetamine-dextroamphetamine (ADDERALL XR) 15 MG 24 hr capsule Take 1 capsule by mouth every morning. 07/05/21 08/04/21 Yes Johnson, Megan P, DO  azaTHIOprine (IMURAN) 50 MG tablet TAKE 1 TABLET BY MOUTH EVERY DAY 05/29/21  Yes Cheryl Flowers B, MD  DULoxetine (CYMBALTA) 60 MG capsule Take 1 capsule (60 mg total) by mouth daily. 04/22/21  Yes Johnson, Megan P, DO  methocarbamol (ROBAXIN) 500 MG tablet Take 0.5-1 tablets (250-500 mg total) by mouth every 8 (eight) hours as needed for muscle spasms. 04/22/21  Yes Johnson, Megan P, DO  metoprolol succinate (TOPROL-XL) 25 MG 24 hr tablet Take 1 tablet (25 mg total) by mouth daily. 04/22/21  Yes Johnson, Megan P, DO  QUEtiapine (SEROQUEL XR) 50 MG TB24  24 hr tablet Take 1-2 tablets (50-100 mg total) by mouth at bedtime. 04/22/21  Yes Johnson, Megan P, DO  amphetamine-dextroamphetamine (ADDERALL XR) 15 MG 24 hr capsule Take 1 capsule by mouth every morning. 05/06/21 06/05/21  Park Liter P, DO  amphetamine-dextroamphetamine (ADDERALL XR) 15 MG 24 hr capsule Take 1 capsule by mouth every morning. 06/05/21 07/05/21  Park Liter P, DO  norethindrone (AYGESTIN) 5 MG tablet Take 1 tablet by mouth daily. 06/04/20 06/04/21  [provider]    Family History  Problem Relation Age of Onset   Heart disease Mother    Hypertension Mother    Rheum arthritis Mother    Ankylosing spondylitis Mother    Hypertension Father    Diabetes Father    Breast cancer Maternal Aunt        early 74   Crohn's disease Maternal Aunt    Heart attack Maternal Grandmother    Stroke Maternal Grandfather      Social History   Tobacco Use   Smoking status: Former    Packs/day: 0.25    Years: 19.00    Pack years: 4.75    Types: Cigarettes    Quit date: 09/17/2020    Years since quitting: 0.8   Smokeless tobacco: Never  Vaping Use   Vaping Use: Former  Substance Use Topics   Alcohol use: Not Currently   Drug use: No    Allergies as of 08/01/2021 - Review Complete 08/01/2021  Allergen Reaction Noted   Morphine and related Itching 12/15/2015   Sulfa antibiotics Hives 12/15/2015     Labs: CMP     Component Value Date/Time   NA 144 07/04/2021  1621   K 4.5 07/04/2021 1621   CL 109 (H) 07/04/2021 1621   CO2 22 07/04/2021 1621   GLUCOSE 82 07/04/2021 1621   GLUCOSE 120 (H) 01/09/2021 2031   BUN 6 07/04/2021 1621   CREATININE 0.92 07/04/2021 1621   CALCIUM 9.2 07/04/2021 1621   PROT 7.4 07/04/2021 1621   ALBUMIN 4.5 07/04/2021 1621   AST 14 07/04/2021 1621   ALT 16 07/04/2021 1621   ALKPHOS 66 07/04/2021 1621   BILITOT 0.3 07/04/2021 1621   GFRNONAA >60 01/09/2021 2031   GFRAA 102 10/16/2020 0905   Lab Results  Component Value Date   WBC  10.0 07/04/2021   HGB 13.8 07/04/2021   HCT 38.6 07/04/2021   MCV 92 07/04/2021   PLT 339 07/04/2021    Imaging Studies:   Assessment and Plan:   CARMELLA KEES is a 40 y.o. y/o female here for follow-up of ileocolonic Crohn's disease, on Entyvio since July 2022, on Imuran for sacroiliitis as per rheumatology  Abdominal symptoms are doing better on Entyvio.  Weyman Rodney was started and Humira was discontinued due to ileal stricture present on Humira  We will schedule for repeat colonoscopy to evaluate the ileal stricture.  Clinically she has improved on Entyvio with less abdominal pain and less abdominal bloating  I have messaged her rheumatologist, Dr. Posey Pronto, given that she is reporting nausea that has increased specially with the increased dose of Imuran and it is listed as a side effect of Imuran.  I do not see mood changes listed as an adverse effect for Entyvio  I will also recheck labs at this time  She states she never heard from dermatology even though referral was placed for annual skin exam.  We will recheck on this referral    Speech recognition software was used to dictate this note.    Follow Up Instructions:    I discussed the assessment and treatment plan with the patient. The patient was provided an opportunity to ask questions and all were answered. The patient agreed with the plan and demonstrated an understanding of the instructions.   The patient was advised to call back or seek an in-person evaluation if the symptoms worsen or if the condition fails to improve as anticipated.  I provided 15 minutes of face-to-face time via video software during this encounter. Additional time was spent in reviewing patient's chart, placing orders etc.   Virgel Manifold, MD  Speech recognition software was used to dictate this note.

## 2021-08-15 ENCOUNTER — Other Ambulatory Visit: Payer: Self-pay | Admitting: Gastroenterology

## 2021-08-15 DIAGNOSIS — K5010 Crohn's disease of large intestine without complications: Secondary | ICD-10-CM

## 2021-08-15 DIAGNOSIS — K5080 Crohn's disease of both small and large intestine without complications: Secondary | ICD-10-CM

## 2021-08-15 MED ORDER — NA SULFATE-K SULFATE-MG SULF 17.5-3.13-1.6 GM/177ML PO SOLN: 1.0000 | Freq: Once | ORAL | 0 refills | Status: DC

## 2021-08-15 MED ORDER — CLENPIQ 10-3.5-12 MG-GM -GM/160ML PO SOLN
ORAL | 0 refills | Status: DC
Start: 2021-08-15 — End: 2021-09-26

## 2021-08-15 NOTE — Addendum Note (Signed)
Addended by: Lurlean Nanny on: 08/15/2021 12:52 PM   Modules accepted: Orders

## 2021-08-15 NOTE — Addendum Note (Signed)
Addended by: Lurlean Nanny on: 08/15/2021 10:29 AM   Modules accepted: Orders

## 2021-08-19 ENCOUNTER — Telehealth: Payer: Self-pay | Admitting: Family Medicine

## 2021-08-19 NOTE — Telephone Encounter (Signed)
Overdue for appt

## 2021-08-20 NOTE — Telephone Encounter (Signed)
Lmom asking pt to call back to schedule an appt. °

## 2021-08-23 DIAGNOSIS — F332 Major depressive disorder, recurrent severe without psychotic features: Secondary | ICD-10-CM | POA: Diagnosis not present

## 2021-08-23 DIAGNOSIS — F17200 Nicotine dependence, unspecified, uncomplicated: Secondary | ICD-10-CM | POA: Diagnosis not present

## 2021-08-23 DIAGNOSIS — F411 Generalized anxiety disorder: Secondary | ICD-10-CM | POA: Diagnosis not present

## 2021-08-23 DIAGNOSIS — F4310 Post-traumatic stress disorder, unspecified: Secondary | ICD-10-CM | POA: Diagnosis not present

## 2021-08-27 ENCOUNTER — Encounter: Payer: Self-pay | Admitting: Gastroenterology

## 2021-08-27 MED ORDER — SODIUM CHLORIDE 0.9 % IV SOLN: INTRAVENOUS | Status: DC

## 2021-08-28 ENCOUNTER — Ambulatory Visit: Payer: Medicaid Other | Admitting: Anesthesiology

## 2021-08-28 ENCOUNTER — Ambulatory Visit
Admission: RE | Admit: 2021-08-28 | Discharge: 2021-08-28 | Disposition: A | Discharge status: Home / Self Care | Payer: Medicaid Other | Attending: Gastroenterology | Admitting: Gastroenterology

## 2021-08-28 ENCOUNTER — Encounter
Admission: RE | Disposition: A | Discharge status: Home / Self Care | Payer: Self-pay | Source: Home / Self Care | Attending: Gastroenterology

## 2021-08-28 ENCOUNTER — Encounter: Payer: Self-pay | Admitting: Gastroenterology

## 2021-08-28 ENCOUNTER — Other Ambulatory Visit: Payer: Self-pay

## 2021-08-28 DIAGNOSIS — K56699 Other intestinal obstruction unspecified as to partial versus complete obstruction: Secondary | ICD-10-CM | POA: Diagnosis not present

## 2021-08-28 DIAGNOSIS — K50012 Crohn's disease of small intestine with intestinal obstruction: Secondary | ICD-10-CM

## 2021-08-28 DIAGNOSIS — K5010 Crohn's disease of large intestine without complications: Secondary | ICD-10-CM

## 2021-08-28 DIAGNOSIS — Z87891 Personal history of nicotine dependence: Secondary | ICD-10-CM | POA: Diagnosis not present

## 2021-08-28 DIAGNOSIS — K6389 Other specified diseases of intestine: Secondary | ICD-10-CM | POA: Diagnosis not present

## 2021-08-28 DIAGNOSIS — K529 Noninfective gastroenteritis and colitis, unspecified: Secondary | ICD-10-CM | POA: Diagnosis not present

## 2021-08-28 DIAGNOSIS — Z79899 Other long term (current) drug therapy: Secondary | ICD-10-CM | POA: Diagnosis not present

## 2021-08-28 DIAGNOSIS — F332 Major depressive disorder, recurrent severe without psychotic features: Secondary | ICD-10-CM | POA: Diagnosis not present

## 2021-08-28 DIAGNOSIS — K50812 Crohn's disease of both small and large intestine with intestinal obstruction: Secondary | ICD-10-CM | POA: Insufficient documentation

## 2021-08-28 DIAGNOSIS — Z9151 Personal history of suicidal behavior: Secondary | ICD-10-CM | POA: Diagnosis not present

## 2021-08-28 DIAGNOSIS — K5080 Crohn's disease of both small and large intestine without complications: Secondary | ICD-10-CM

## 2021-08-28 HISTORY — DX: Crohn's disease, unspecified, without complications: K50.90

## 2021-08-28 HISTORY — PX: COLONOSCOPY WITH PROPOFOL: SHX5780

## 2021-08-28 LAB — POCT PREGNANCY, URINE: Preg Test, Ur: NEGATIVE

## 2021-08-28 SURGERY — COLONOSCOPY WITH PROPOFOL
Anesthesia: General

## 2021-08-28 MED ORDER — DEXMEDETOMIDINE HCL IN NACL 200 MCG/50ML IV SOLN
INTRAVENOUS | Status: DC | PRN
  Administered 2021-08-28: 12 ug via INTRAVENOUS
  Administered 2021-08-28: 8 ug via INTRAVENOUS

## 2021-08-28 MED ORDER — PROPOFOL 10 MG/ML IV BOLUS
INTRAVENOUS | Status: DC | PRN
  Administered 2021-08-28: 40 mg via INTRAVENOUS
  Administered 2021-08-28: 100 mg via INTRAVENOUS
  Administered 2021-08-28: 30 mg via INTRAVENOUS

## 2021-08-28 MED ORDER — LIDOCAINE HCL (CARDIAC) PF 100 MG/5ML IV SOSY
PREFILLED_SYRINGE | INTRAVENOUS | Status: DC | PRN
  Administered 2021-08-28: 50 mg via INTRAVENOUS

## 2021-08-28 MED ORDER — SODIUM CHLORIDE 0.9 % IV SOLN: INTRAVENOUS | Status: DC

## 2021-08-28 MED ORDER — PROPOFOL 500 MG/50ML IV EMUL
INTRAVENOUS | Status: AC
  Filled 2021-08-28: qty 50

## 2021-08-28 MED ORDER — PROPOFOL 500 MG/50ML IV EMUL
INTRAVENOUS | Status: DC | PRN
  Administered 2021-08-28: 125 ug/kg/min via INTRAVENOUS

## 2021-08-28 NOTE — Transfer of Care (Signed)
Immediate Anesthesia Transfer of Care Note  Patient: Cheryl Flowers  Procedure(s) Performed: COLONOSCOPY WITH PROPOFOL  Patient Location: PACU  Anesthesia Type:MAC  Level of Consciousness: awake and drowsy  Airway & Oxygen Therapy: Patient Spontanous Breathing and Patient connected to nasal cannula oxygen  Post-op Assessment: Report given to RN and Post -op Vital signs reviewed and stable  Post vital signs: stable  Last Vitals:  Vitals Value Taken Time  BP 125/86 08/28/21 0958  Temp 36.1 C 08/28/21 0958  Pulse 81 08/28/21 0959  Resp 23 08/28/21 0959  SpO2 100 % 08/28/21 0959  Vitals shown include unvalidated device data.  Last Pain:  Vitals:   08/28/21 0958  TempSrc: Temporal  PainSc: Asleep         Complications: No notable events documented.

## 2021-08-28 NOTE — Telephone Encounter (Signed)
She will need an appointment to get her adderall. It is up to her if she'd like to wait until 11/14 or if she'd like to have an appointment sooner.

## 2021-08-28 NOTE — H&P (Signed)
Cheryl Antigua, MD [address] [place], [place], [place], [address] [address], [place], [place], [place], [address] Phone: [phone]  Fax: [phone]  Primary Care Physician:  [name], DO   Pre-Procedure History & Physical: HPI:  Cheryl Flowers is a 40 y.o. female is here for a colonoscopy.   Past Medical History:  Diagnosis Date   Anxiety    Crohn's disease ([place])    Depression    Low back pain    Marital conflict    Panic disorder    Thyromegaly     Past Surgical History:  Procedure Laterality Date   CESAREAN SECTION  2003 and 2010   CHOLECYSTECTOMY  2007   COLONOSCOPY WITH PROPOFOL N/A 03/07/2020   Procedure: COLONOSCOPY WITH PROPOFOL;  Surgeon: Virgel Manifold, MD;  Location: [facility] ENDOSCOPY;  Service: Endoscopy;  Laterality: N/A;   COLONOSCOPY WITH PROPOFOL N/A 07/27/2020   Procedure: COLONOSCOPY WITH PROPOFOL;  Surgeon: Virgel Manifold, MD;  Location: [facility] ENDOSCOPY;  Service: Endoscopy;  Laterality: N/A;   COLONOSCOPY WITH PROPOFOL N/A 03/27/2021   Procedure: COLONOSCOPY WITH PROPOFOL;  Surgeon: Virgel Manifold, MD;  Location: [facility] ENDOSCOPY;  Service: Endoscopy;  Laterality: N/A;   TUBAL LIGATION  2010    Prior to Admission medications   Medication Sig Start Date End Date Taking? Authorizing Provider  albuterol (VENTOLIN HFA) 108 (90 Base) MCG/ACT inhaler Inhale 2 puffs into the lungs every 6 (six) hours as needed for wheezing or shortness of breath. 03/26/21  Yes Martyn Ehrich, NP  azaTHIOprine (IMURAN) 50 MG tablet TAKE 1 TABLET BY MOUTH EVERY DAY 05/29/21  Yes Cheryl Flowers B, MD  metoprolol succinate (TOPROL-XL) 25 MG 24 hr tablet Take 1 tablet (25 mg total) by mouth daily. 04/22/21  Yes Johnson, Megan P, DO  norethindrone (AYGESTIN) 5 MG tablet Take 1 tablet by mouth daily. 06/04/20 08/28/21 Yes [provider]  amphetamine-dextroamphetamine (ADDERALL XR) 15 MG 24 hr capsule Take 1 capsule by mouth every  morning. 05/06/21 06/05/21  Park Liter P, DO  amphetamine-dextroamphetamine (ADDERALL XR) 15 MG 24 hr capsule Take 1 capsule by mouth every morning. 06/05/21 07/05/21  Park Liter P, DO  amphetamine-dextroamphetamine (ADDERALL XR) 15 MG 24 hr capsule Take 1 capsule by mouth every morning. 07/05/21 08/04/21  Johnson, Megan P, DO  DULoxetine (CYMBALTA) 60 MG capsule Take 1 capsule (60 mg total) by mouth daily. Patient not taking: Reported on 08/28/2021 04/22/21   Park Liter P, DO  methocarbamol (ROBAXIN) 500 MG tablet Take 0.5-1 tablets (250-500 mg total) by mouth every 8 (eight) hours as needed for muscle spasms. Patient not taking: Reported on 08/28/2021 04/22/21   Park Liter P, DO  QUEtiapine (SEROQUEL XR) 50 MG TB24 24 hr tablet Take 1-2 tablets (50-100 mg total) by mouth at bedtime. Patient not taking: Reported on 08/28/2021 04/22/21   Park Liter P, DO  Sod Picosulfate-Mag Ox-Cit [facility]) 10-3.5-12 MG-GM -GM/160ML SOLN Use as instructed 08/15/21   Virgel Manifold, MD    Allergies as of 08/15/2021 - Review Complete 08/01/2021  Allergen Reaction Noted   Morphine and related Itching 12/15/2015   Sulfa antibiotics Hives 12/15/2015    Family History  Problem Relation Age of Onset   Heart disease Mother    Hypertension Mother    Rheum arthritis Mother    Ankylosing spondylitis Mother    Hypertension Father    Diabetes Father    Breast cancer Maternal Aunt  early 11   Crohn's disease Maternal Aunt    Heart attack Maternal Grandmother    Stroke Maternal Grandfather     Social History   Socioeconomic History   Marital status: Married    Spouse name: Not on file   Number of children: Not on file   Years of education: Not on file   Highest education level: Associate degree: occupational, Hotel manager, or vocational program  Occupational History   Not on file  Tobacco Use   Smoking status: Former    Packs/day: 0.25    Years: 19.00    Pack years: 4.75    Types:  Cigarettes    Quit date: 09/17/2020    Years since quitting: 0.9   Smokeless tobacco: Never  Vaping Use   Vaping Use: Former  Substance and Sexual Activity   Alcohol use: Not Currently   Drug use: No   Sexual activity: Yes    Birth control/protection: Surgical  Other Topics Concern   Not on file  Social History Narrative   Not on file   Social Determinants of Health   Financial Resource Strain: Low Risk    Difficulty of Paying Living Expenses: Not very hard  Food Insecurity: No Food Insecurity   Worried About Running Out of Food in the Last Year: Never true   [place] in the Last Year: Never true  Transportation Needs: No Transportation Needs   Lack of Transportation (Medical): No   Lack of Transportation (Non-Medical): No  Physical Activity: Insufficiently Active   Days of Exercise per Week: 1 day   Minutes of Exercise per Session: 10 min  Stress: Stress Concern Present   Feeling of Stress : Rather much  Social Connections: Moderately Integrated   Frequency of Communication with Friends and Family: Twice a week   Frequency of Social Gatherings with Friends and Family: Once a week   Attends Religious Services: More than 4 times per year   Active Member of [employer] or Organizations: No   Attends Music therapist: Not on file   Marital Status: Married  Human resources officer Violence: Not on file    Review of Systems: See HPI, otherwise negative ROS  Physical Exam: Constitutional: General:   Alert,  Well-developed, well-nourished, pleasant and cooperative in NAD BP 130/90   Pulse 93   Temp 97.6 F (36.4 C) (Temporal)   Resp 20   Ht 5' 4.5" (1.638 m)   Wt 97.1 kg   SpO2 99%   BMI 36.17 kg/m   Head: Normocephalic, atraumatic.   Eyes:  Sclera clear, no icterus.   Conjunctiva pink.   Mouth:  No deformity or lesions, oropharynx pink & moist.  Neck:  Supple, trachea midline  Respiratory: Normal respiratory effort  Gastrointestinal:  Soft,  non-tender and non-distended without masses, hepatosplenomegaly or hernias noted.  No guarding or rebound tenderness.     Cardiac: No clubbing or edema.  No cyanosis. Normal posterior tibial pedal pulses noted.  Lymphatic:  No significant cervical adenopathy.  Psych:  Alert and cooperative. Normal mood and affect.  Musculoskeletal:   Symmetrical without gross deformities. 5/5 Lower extremity strength bilaterally.  Skin: Warm. Intact without significant lesions or rashes. No jaundice.  Neurologic:  Face symmetrical, tongue midline, Normal sensation to touch;  grossly normal neurologically.  Psych:  Alert and oriented x3, Alert and cooperative. Normal mood and affect.  Impression/Plan: Cheryl Flowers is here for a colonoscopy to be performed for Crohns disease.  Risks, benefits, limitations, and  alternatives regarding  colonoscopy have been reviewed with the patient.  Questions have been answered.  All parties agreeable.   Virgel Manifold, MD  08/28/2021, 9:19 AM

## 2021-08-28 NOTE — Consult Note (Signed)
[facility] Consult   Reason for Consult: Patient seen for consultation.  Came for endoscopy and had a low but positive score on suicide screening questions Referring Physician:  Tahiliani Patient Identification: Cheryl Flowers MRN:  [id number] Principal Diagnosis: Severe recurrent major depression without psychotic features ([place]) Diagnosis:  Principal Problem:   Severe recurrent major depression without psychotic features ([place]) Active Problems:   Crohn's disease of both small and large intestine with intestinal obstruction ([facility])   Stenosis of ileocecal valve   Total Time spent with patient: 30 minutes  Subjective:   Cheryl Flowers is a 40 y.o. female patient admitted with "I am feeling okay".  HPI: Patient seen chart reviewed.  40 year old woman is here today for an outpatient endoscopy.  She scored low but positive on the suicide screening questions.  On interview the patient says that she has chronic problems with her mood and for the last couple months she has been having spells of moodiness where her mood will either be very down or very up with no in between.  She denies having any hallucinations or psychotic symptoms.  Patient denies any suicidal thought intent or plan.  Says that she has had thoughts very occasionally of hurting herself and does have a past history of suicide attempts but currently has no thought of doing that.  She lives with her husband who is supportive.  She is seeing an outpatient mental health provider and is on medication and has a follow-up appointment arranged.  She denies any alcohol or drug abuse  Past Psychiatric History: History of depression with prior hospitalizations.  Had a suicide attempt back in April 2019 but not since then.  Current medication reported as being Abilify and hydroxyzine.  Past history of some substance use but not currently using.  Risk to Self:   Risk to Others:   Prior Inpatient Therapy:    Prior Outpatient Therapy:    Past Medical History:  Past Medical History:  Diagnosis Date   Anxiety    Crohn's disease ([place])    Depression    Low back pain    Marital conflict    Panic disorder    Thyromegaly     Past Surgical History:  Procedure Laterality Date   CESAREAN SECTION  2003 and 2010   CHOLECYSTECTOMY  2007   COLONOSCOPY WITH PROPOFOL N/A 03/07/2020   Procedure: COLONOSCOPY WITH PROPOFOL;  Surgeon: Virgel Manifold, MD;  Location: [facility] ENDOSCOPY;  Service: Endoscopy;  Laterality: N/A;   COLONOSCOPY WITH PROPOFOL N/A 07/27/2020   Procedure: COLONOSCOPY WITH PROPOFOL;  Surgeon: Virgel Manifold, MD;  Location: [facility] ENDOSCOPY;  Service: Endoscopy;  Laterality: N/A;   COLONOSCOPY WITH PROPOFOL N/A 03/27/2021   Procedure: COLONOSCOPY WITH PROPOFOL;  Surgeon: Virgel Manifold, MD;  Location: [facility] END[facility]COPY;  Service: Endoscopy;  Laterality: N/A;   TUBAL LIGATION  2010   Family History:  Family History  Problem Relation Age of Onset   Heart disease Mother    Hypertension Mother    Rheum arthritis Mother    Ankylosing spondylitis Mother    Hypertension Father    Diabetes Father    Breast cancer Maternal Aunt        early 76   Crohn's disease Maternal Aunt    Heart attack Maternal Grandmother    Stroke Maternal Grandfather    Family Psychiatric  History: None reported Social History:  Social History   Substance and Sexual Activity  Alcohol Use Not Currently  Social History   Substance and Sexual Activity  Drug Use No    Social History   Socioeconomic History   Marital status: Married    Spouse name: Not on file   Number of children: Not on file   Years of education: Not on file   Highest education level: Associate degree: occupational, Hotel manager, or vocational program  Occupational History   Not on file  Tobacco Use   Smoking status: Former    Packs/day: 0.25    Years: 19.00    Pack years: 4.75    Types: Cigarettes    Quit date:  09/17/2020    Years since quitting: 0.9   Smokeless tobacco: Never  Vaping Use   Vaping Use: Former  Substance and Sexual Activity   Alcohol use: Not Currently   Drug use: No   Sexual activity: Yes    Birth control/protection: Surgical  Other Topics Concern   Not on file  Social History Narrative   Not on file   Social Determinants of Health   Financial Resource Strain: Low Risk    Difficulty of Paying Living Expenses: Not very hard  Food Insecurity: No Food Insecurity   Worried About Running Out of Food in the Last Year: Never true   [place] in the Last Year: Never true  Transportation Needs: No Transportation Needs   Lack of Transportation (Medical): No   Lack of Transportation (Non-Medical): No  Physical Activity: Insufficiently Active   Days of Exercise per Week: 1 day   Minutes of Exercise per Session: 10 min  Stress: Stress Concern Present   Feeling of Stress : Rather much  Social Connections: Moderately Integrated   Frequency of Communication with Friends and Family: Twice a week   Frequency of Social Gatherings with Friends and Family: Once a week   Attends Religious Services: More than 4 times per year   Active Member of [employer] or Organizations: No   Attends Music therapist: Not on file   Marital Status: Married   Additional Social History:    Allergies:   Allergies  Allergen Reactions   Morphine And Related Itching   Sulfa Antibiotics Hives    Labs:  Results for orders placed or performed during the hospital encounter of 08/28/21 (from the past 48 hour(s))  Pregnancy, urine POC     Status: None   Collection Time: 08/28/21  9:07 AM  Result Value Ref Range   Preg Test, Ur NEGATIVE NEGATIVE    Comment:        THE SENSITIVITY OF THIS METHODOLOGY IS >24 mIU/mL     Current Facility-Administered Medications  Medication Dose Route Frequency Provider Last Rate Last Admin   0.9 %  sodium chloride infusion   Intravenous Continuous  Tahiliani, Varnita B, MD       0.9 %  sodium chloride infusion   Intravenous Continuous [name] B, MD 20 mL/hr at 08/28/21 0916 Restarted at 08/28/21 0955    Musculoskeletal: Strength & Muscle Tone: within normal limits Gait & Station: normal Patient leans: N/A            Psychiatric Specialty Exam:  Presentation  General Appearance: No data recorded Eye Contact:No data recorded Speech:No data recorded Speech Volume:No data recorded Handedness:No data recorded  Mood and Affect  Mood:No data recorded Affect:No data recorded  Thought Process  Thought Processes:No data recorded Descriptions of Associations:No data recorded Orientation:No data recorded Thought Content:No data recorded History of Schizophrenia/Schizoaffective disorder:No data recorded Duration  of Psychotic Symptoms:No data recorded Hallucinations:No data recorded Ideas of Reference:No data recorded Suicidal Thoughts:No data recorded Homicidal Thoughts:No data recorded  Sensorium  Memory:No data recorded Judgment:No data recorded Insight:No data recorded  Executive Functions  Concentration:No data recorded Attention Span:No data recorded Recall:No data recorded Fund of [place] recorded Language:No data recorded  Psychomotor Activity  Psychomotor Activity:No data recorded  Assets  Assets:No data recorded  Sleep  Sleep:No data recorded  Physical Exam: Physical Exam Vitals and nursing note reviewed.  Constitutional:      Appearance: Normal appearance.  HENT:     Head: Normocephalic and atraumatic.     Mouth/Throat:     Pharynx: Oropharynx is clear.  Eyes:     Pupils: Pupils are equal, round, and reactive to light.  Cardiovascular:     Rate and Rhythm: Normal rate and regular rhythm.  Pulmonary:     Effort: Pulmonary effort is normal.     Breath sounds: Normal breath sounds.  Abdominal:     General: Abdomen is flat.     Palpations: Abdomen is soft.   Musculoskeletal:        General: Normal range of motion.  Skin:    General: Skin is warm and dry.  Neurological:     General: No focal deficit present.     Mental Status: She is alert. Mental status is at baseline.  Psychiatric:        Attention and Perception: Attention normal.        Mood and Affect: Mood normal.        Speech: Speech normal.        Behavior: Behavior normal.        Thought Content: Thought content normal.        Cognition and Memory: Cognition normal.        Judgment: Judgment normal.   Review of Systems  Constitutional: Negative.   HENT: Negative.    Eyes: Negative.   Respiratory: Negative.    Cardiovascular: Negative.   Gastrointestinal: Negative.   Musculoskeletal: Negative.   Skin: Negative.   Neurological: Negative.   Psychiatric/Behavioral:  Positive for depression. Negative for hallucinations, memory loss, substance abuse and suicidal ideas. The patient is not nervous/anxious and does not have insomnia.   Blood pressure (!) 135/94, pulse 81, temperature (!) 97 F (36.1 C), temperature source Temporal, resp. rate (!) 23, height 5' 4.5" (1.638 m), weight 97.1 kg, SpO2 100 %. Body mass index is 36.17 kg/m.  Treatment Plan Summary: Plan 40 year old woman with a history of recurrent major depression who is currently denying any suicidal ideation.  She seems to have good insight and understanding of her illness.  She is engaged in appropriate outpatient treatment.  Patient and her husband do possess firearms at home but keep them locked up and were educated that they should be kept away from where the patient can have access to them while she is suffering from mood disorder.  Patient currently does not meet commitment criteria and does not require inpatient treatment.  Her current outpatient plan is appropriate.  Supportive counseling completed.  Case reviewed with endoscopy and GI staff.  No further intervention required at this time.  Disposition: No  evidence of imminent risk to self or others at present.   Patient does not meet criteria for psychiatric inpatient admission. Supportive therapy provided about ongoing stressors. Discussed crisis plan, support from social network, calling 911, coming to the Emergency Department, and calling Suicide Hotline.  Alethia Berthold, MD 08/28/2021 11:51  AM

## 2021-08-28 NOTE — Op Note (Signed)
[facility] Gastroenterology Patient Name: Cheryl Flowers Procedure Date: 08/28/2021 9:17 AM MRN: [id number] Account #: [id number] Date of Birth: 10/09/1981 Admit Type: Outpatient Age: 40 Room: [facility] ENDO ROOM 3 Gender: Female Note Status: Finalized Instrument Name: Colonscope [id number] Procedure:             Colonoscopy Indications:           Follow-up of Crohn's disease of the small bowel and                         colon Providers:             Setsuko Robins B. Bonna Gains MD, MD Medicines:             Monitored Anesthesia Care Complications:         No immediate complications. Procedure:             Pre-Anesthesia Assessment:                        - Prior to the procedure, a History and Physical was                         performed, and patient medications, allergies and                         sensitivities were reviewed. The patient's tolerance                         of previous anesthesia was reviewed.                        - The risks and benefits of the procedure and the                         sedation options and risks were discussed with the                         patient. All questions were answered and informed                         consent was obtained.                        - Patient identification and proposed procedure were                         verified prior to the procedure by the physician, the                         nurse, the anesthesiologist, the anesthetist and the                         technician. The procedure was verified in the                         pre-procedure area in the procedure room in the                         endoscopy suite.                        -  Prophylactic Antibiotics: The patient does not                         require prophylactic antibiotics.                        - ASA Grade Assessment: II - A patient with mild                         systemic disease.                        - After reviewing the  risks and benefits, the patient                         was deemed in satisfactory condition to undergo the                         procedure.                        - Monitored anesthesia care was determined to be                         medically necessary for this procedure based on review                         of the patient's medical history, medications, and                         prior anesthesia history.                        - The anesthesia plan was to use monitored anesthesia                         care (MAC).                        After obtaining informed consent, the colonoscope was                         passed under direct vision. Throughout the procedure,                         the patient's blood pressure, pulse, and oxygen                         saturations were monitored continuously. The                         Colonoscope was introduced through the anus and                         advanced to the the cecum, identified by                         transillumination. The colonoscopy was performed with                         ease. The  patient tolerated the procedure well. The                         quality of the bowel preparation was good. Findings:      The perianal and digital rectal examinations were normal.      The terminal ileum contained a benign-appearing, intrinsic severe       stenosis that was non-traversed. Biopsies were taken with a cold forceps       for histology.      A patchy area of mildly altered vascular mucosa was found in the       ascending colon and in the cecum. Biopsies were taken with a cold       forceps for histology.      The exam was otherwise normal throughout the examined colon.      The rectum, sigmoid colon, descending colon and transverse colon       appeared normal. Biopsies were taken with a cold forceps for histology.      The retroflexed view of the distal rectum and anal verge was normal and       showed no anal or  rectal abnormalities. 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 Biopsied.                        - Altered vascular mucosa in the ascending colon and                         in the cecum. Biopsied.                        - The rectum, sigmoid colon, descending colon and                         transverse colon are normal. Biopsied. Recommendation:        - Await pathology results.                        - Continue present medications.                        - Return to my office as previously scheduled.                        - Pt may need referral for surgery vs dilation given                         persistent terminal ileum stricture.                        - The findings and recommendations were discussed with                         the patient.                        - The findings and recommendations were discussed with                         the patient's family.                        -  Discharge patient to home (with escort).                        - Resume previous diet. Procedure Code(s):     --- Professional ---                        [phone], Colonoscopy, flexible; with biopsy, single or                         multiple Diagnosis Code(s):     --- Professional ---                        T46.568, Crohn's disease of both small and large                         intestine with intestinal obstruction                        K63.89, Other specified diseases of intestine CPT copyright 2019 American Medical Association. All rights reserved. The codes documented in this report are preliminary and upon coder review may  be revised to meet current compliance requirements.  [name], MD Margretta Sidle B. Bonna Gains MD, MD 08/28/2021 10:13:20 AM This report has been signed electronically. Number of Addenda: 0 Note Initiated On: 08/28/2021 9:17 AM Scope Withdrawal Time: 0 hours 23 minutes 58 seconds  Total Procedure Duration: 0 hours 29 minutes 9 seconds  Estimated  Blood Loss:  Estimated blood loss: none.      [facility]

## 2021-08-28 NOTE — Anesthesia Preprocedure Evaluation (Addendum)
Anesthesia Evaluation  Patient identified by MRN, date of birth, ID band Patient awake    Reviewed: Allergy & Precautions, NPO status , Patient's Chart, lab work & pertinent test results  History of Anesthesia Complications Negative for: history of anesthetic complications  Airway Mallampati: II       Dental  (+) Dental Advidsory Given   Pulmonary neg shortness of breath, asthma , neg sleep apnea, neg COPD, neg recent URI, Not current smoker, former smoker,           Cardiovascular hypertension, Pt. on medications and Pt. on home beta blockers (-) angina(-) Past MI and (-) CHF + dysrhythmias (tachycardia) (-) Valvular Problems/Murmurs     Neuro/Psych neg Seizures Anxiety Depression    GI/Hepatic Neg liver ROS, GERD  Medicated and Controlled,  Endo/Other  neg diabetes  Renal/GU negative Renal ROS     Musculoskeletal   Abdominal   Peds  Hematology   Anesthesia Other Findings Past Medical History: No date: Anxiety No date: Crohn's disease ([place]) No date: Depression No date: Low back pain No date: Marital conflict No date: Panic disorder No date: Thyromegaly   Reproductive/Obstetrics                             Anesthesia Physical  Anesthesia Plan  ASA: 2  Anesthesia Plan: General   Post-op Pain Management:    Induction: Intravenous  PONV Risk Score and Plan: 3 and Propofol infusion and TIVA  Airway Management Planned: Nasal Cannula  Additional Equipment:   Intra-op Plan:   Post-operative Plan:   Informed Consent: I have reviewed the patients History and Physical, chart, labs and discussed the procedure including the risks, benefits and alternatives for the proposed anesthesia with the patient or authorized representative who has indicated his/her understanding and acceptance.       Plan Discussed with:   Anesthesia Plan Comments:         Anesthesia Quick  Evaluation

## 2021-08-28 NOTE — Telephone Encounter (Signed)
Lmom asking pt to call back to see if she wants to wait for adderall or to make an appt in order to get that prescription per provider response

## 2021-08-29 ENCOUNTER — Encounter: Payer: Self-pay | Admitting: Gastroenterology

## 2021-08-29 LAB — SURGICAL PATHOLOGY

## 2021-08-30 NOTE — Anesthesia Postprocedure Evaluation (Signed)
Anesthesia Post Note  Patient: Emika Tiano Ogden  Procedure(s) Performed: COLONOSCOPY WITH PROPOFOL  Patient location during evaluation: Endoscopy Anesthesia Type: General Level of consciousness: awake and alert Pain management: pain level controlled Vital Signs Assessment: post-procedure vital signs reviewed and stable Respiratory status: spontaneous breathing, nonlabored ventilation, respiratory function stable and patient connected to nasal cannula oxygen Cardiovascular status: blood pressure returned to baseline and stable Postop Assessment: no apparent nausea or vomiting Anesthetic complications: no   No notable events documented.   Last Vitals:  Vitals:   08/28/21 1020 08/28/21 1030  BP: (!) 149/98 (!) 135/94  Pulse: 84 81  Resp: 19 (!) 23  Temp:    SpO2: 100% 100%    Last Pain:  Vitals:   08/28/21 0958  TempSrc: Temporal  PainSc: Asleep                 Martha Clan

## 2021-09-05 ENCOUNTER — Telehealth: Payer: Self-pay

## 2021-09-05 NOTE — Telephone Encounter (Signed)
Cheryl Flowers with [facility] wanted to verify if pt is to continue Entyvio. OV notes state pt was taking medication.... However when they called the pt to set up appt for next dose, pt stated that she declined as she was having increased joint pain...  Please advise

## 2021-09-06 ENCOUNTER — Encounter: Payer: Self-pay | Admitting: Gastroenterology

## 2021-09-09 ENCOUNTER — Telehealth: Payer: Self-pay

## 2021-09-09 ENCOUNTER — Encounter: Payer: Medicaid Other | Admitting: Family Medicine

## 2021-09-09 NOTE — Telephone Encounter (Signed)
Fwd

## 2021-09-11 ENCOUNTER — Telehealth: Payer: Self-pay

## 2021-09-11 NOTE — Telephone Encounter (Signed)
Virgel Manifold, MD to [place], Cheryl Rainwater "Cheryl Flowers"     7:30 PM Hi Cheryl Flowers,   We received a message that you did not schedule your entyvio infusion. I tried calling you a few times to discuss this further, but was unable to reach you. We would need to discuss possible changes in your medications due to your joint pain. However, we will likely need to continue the entyvio until another medication can be approved. The entyvio has lead to an improvement in your disease. Please call us at [phone] to discuss this further.

## 2021-09-13 ENCOUNTER — Ambulatory Visit: Payer: Medicaid Other | Admitting: Family Medicine

## 2021-09-23 NOTE — Telephone Encounter (Signed)
Humira form filled out and faxed along with demographics, labs and progress notes attached

## 2021-09-26 ENCOUNTER — Encounter: Payer: Self-pay | Admitting: Family Medicine

## 2021-09-26 ENCOUNTER — Other Ambulatory Visit: Payer: Self-pay

## 2021-09-26 ENCOUNTER — Other Ambulatory Visit: Payer: Self-pay | Admitting: Family Medicine

## 2021-09-26 ENCOUNTER — Ambulatory Visit: Payer: Medicaid Other | Admitting: Family Medicine

## 2021-09-26 ENCOUNTER — Ambulatory Visit: Payer: Medicaid Other

## 2021-09-26 VITALS — BP 104/70 | HR 90 | Temp 98.5°F | Wt 230.0 lb

## 2021-09-26 DIAGNOSIS — R61 Generalized hyperhidrosis: Secondary | ICD-10-CM

## 2021-09-26 DIAGNOSIS — F332 Major depressive disorder, recurrent severe without psychotic features: Secondary | ICD-10-CM | POA: Diagnosis not present

## 2021-09-26 DIAGNOSIS — Z23 Encounter for immunization: Secondary | ICD-10-CM

## 2021-09-26 DIAGNOSIS — R609 Edema, unspecified: Secondary | ICD-10-CM

## 2021-09-26 DIAGNOSIS — R600 Localized edema: Secondary | ICD-10-CM

## 2021-09-26 MED ORDER — PREDNISONE 10 MG PO TABS: ORAL_TABLET | ORAL | 0 refills | Status: DC

## 2021-09-26 NOTE — Assessment & Plan Note (Signed)
Following with psychiatry. Not doing well. Due to see them again on 12/9. Call with any concerns or if not getting better.

## 2021-09-26 NOTE — Assessment & Plan Note (Signed)
Encouraged diet and exercise. Call with any concerns. Continue to monitor.

## 2021-09-26 NOTE — Progress Notes (Signed)
BP 104/70   Pulse 90   Temp 98.5 F (36.9 C)   Wt 230 lb (104.3 kg)   SpO2 99%   BMI 38.87 kg/m    Subjective:    Patient ID: Cheryl Flowers, female    DOB: [birth year], 40 y.o.   MRN: [id number]  HPI: Cheryl Flowers is a 40 y.o. female  Chief Complaint  Patient presents with   Edema    Patient states she is having swelling and numbness in legs. More in the right leg. Patient states hands are swollen as well for about a month   Fatigue    Patient states she has been very fatigue especially since she has been off her aderall    Night Sweats    Patient states for about 6 months she has been having night sweats, some nights she has to change during the night   Has not been feeling well. She had COVID in August but then was 100% better. She notes that she has been having swelling since her colonoscopy at the beginning of November. She notes that it happened pretty quickly. R>L and a little red. No better in the last month. Has also been having SOB. Some chest pain at the beginning of November, but that is entirely gone. She notes that her legs feel heavy and tight. The pain gets worse throughout the day.   Having bad night sweats- this has been going on for several months, but has been getting worse over the past few weeks.   DEPRESSION- seeing psychiatry but not doing well, had to stop her abilify as it was making her worse. Not currently on anything except her hydroxyzine. Mood status: uncontrolled Satisfied with current treatment?: no Symptom severity: moderate  Duration of current treatment : years Side effects: no Medication compliance: fair compliance Psychotherapy/counseling: no  Previous psychiatric medications: seroquel, abilify, adderall Depressed mood: yes Anxious mood: yes Anhedonia: yes Significant weight loss or gain: yes Insomnia: no  Fatigue: yes Feelings of worthlessness or guilt: yes Impaired concentration/indecisiveness: yes Suicidal  ideations: yes Hopelessness: yes Crying spells: no Depression screen [facility] 2/9 09/26/2021 04/22/2021 11/05/2020 04/05/2020 02/29/2020  Decreased Interest 2 3 0 1 3  Down, Depressed, Hopeless 2 3 1 1 3   PHQ - 2 Score 4 6 1 2 6   Altered sleeping 3 3 1 2 2   Tired, decreased energy 3 3 0 2 2  Change in appetite 0 1 0 1 2  Feeling bad or failure about yourself  2 1 0 0 3  Trouble concentrating 3 1 1 2 2   Moving slowly or fidgety/restless 2 0 0 0 1  Suicidal thoughts 1 1 0 0 2  PHQ-9 Score 18 16 3 9 20   Difficult doing work/chores Very difficult Extremely dIfficult Not difficult at all Very difficult Very difficult  Some recent data might be hidden   GAD 7 : Generalized Anxiety Score 09/26/2021 11/05/2020 04/05/2020 02/29/2020  Nervous, Anxious, on Edge 3 1 2 2   Control/stop worrying 2 0 2 2  Worry too much - different things 3 1 2 2   Trouble relaxing 2 0 2 2  Restless 2 1 0 1  Easily annoyed or irritable 3 1 3 3   Afraid - awful might happen 3 0 2 2  Total GAD 7 Score 18 4 13 14   Anxiety Difficulty - - Very difficult Very difficult       Relevant past medical, surgical, family and social history reviewed and updated as indicated.  Interim medical history since our last visit reviewed. Allergies and medications reviewed and updated.  Review of Systems  Constitutional:  Positive for diaphoresis, fatigue and unexpected weight change. Negative for activity change, appetite change, chills and fever.  HENT: Negative.    Respiratory:  Positive for chest tightness and shortness of breath. Negative for apnea, cough, choking, wheezing and stridor.   Cardiovascular:  Positive for chest pain and leg swelling. Negative for palpitations.  Gastrointestinal: Negative.   Musculoskeletal:  Positive for arthralgias, joint swelling and myalgias. Negative for back pain, gait problem, neck pain and neck stiffness.  Skin: Negative.   Neurological:  Positive for dizziness, light-headedness and numbness. Negative for  tremors, seizures, syncope, facial asymmetry, speech difficulty, weakness and headaches.  Psychiatric/Behavioral:  Positive for decreased concentration and dysphoric mood. Negative for agitation, behavioral problems, confusion, hallucinations, self-injury, sleep disturbance and suicidal ideas. The patient is nervous/anxious. The patient is not hyperactive.    Per HPI unless specifically indicated above     Objective:    BP 104/70   Pulse 90   Temp 98.5 F (36.9 C)   Wt 230 lb (104.3 kg)   SpO2 99%   BMI 38.87 kg/m   Wt Readings from Last 3 Encounters:  09/26/21 230 lb (104.3 kg)  08/28/21 214 lb (97.1 kg)  07/04/21 218 lb (98.9 kg)    Physical Exam Vitals and nursing note reviewed.  Constitutional:      General: She is not in acute distress.    Appearance: Normal appearance. She is not ill-appearing, toxic-appearing or diaphoretic.  HENT:     Head: Normocephalic and atraumatic.     Right Ear: External ear normal.     Left Ear: External ear normal.     Nose: Nose normal.     Mouth/Throat:     Mouth: Mucous membranes are moist.     Pharynx: Oropharynx is clear.  Eyes:     General: No scleral icterus.       Right eye: No discharge.        Left eye: No discharge.     Extraocular Movements: Extraocular movements intact.     Conjunctiva/sclera: Conjunctivae normal.     Pupils: Pupils are equal, round, and reactive to light.  Cardiovascular:     Rate and Rhythm: Normal rate and regular rhythm.     Pulses: Normal pulses.     Heart sounds: Normal heart sounds. No murmur heard.   No friction rub. No gallop.  Pulmonary:     Effort: Pulmonary effort is normal. No respiratory distress.     Breath sounds: Normal breath sounds. No stridor. No wheezing, rhonchi or rales.  Chest:     Chest wall: No tenderness.  Musculoskeletal:        General: No swelling, tenderness, deformity or signs of injury. Normal range of motion.     Cervical back: Normal range of motion and neck supple.      Right lower leg: No edema.     Left lower leg: No edema.     Comments: Trace tense edema bilaterally, + Squeeze test bilaterally  Skin:    General: Skin is warm and dry.     Capillary Refill: Capillary refill takes less than 2 seconds.     Coloration: Skin is not jaundiced or pale.     Findings: No bruising, erythema, lesion or rash.  Neurological:     General: No focal deficit present.     Mental Status: She is alert and oriented  to person, place, and time. Mental status is at baseline.  Psychiatric:        Mood and Affect: Mood normal.        Behavior: Behavior normal.        Thought Content: Thought content normal.        Judgment: Judgment normal.    Results for orders placed or performed during the hospital encounter of 08/28/21  Pregnancy, urine POC  Result Value Ref Range   Preg Test, Ur NEGATIVE NEGATIVE  Surgical pathology  Result Value Ref Range   SURGICAL PATHOLOGY      SURGICAL PATHOLOGY CASE: [id number] PATIENT: Milana Kidney Surgical Pathology Report     Specimen Submitted: A. Terminal ileum; cbx B. Colon, asc, cecum; cbx C. Colon, transverse; cbx D. Colon, descending; cbx E. Colon, rectum, sigmoid; cbx  Clinical History: Crohn's disease of colon without complication W73.71, G62.69 Crohn's disease of both small and large intestine without complication S85.46. Ileum stricture.    DIAGNOSIS: A. TERMINAL ILEUM; COLD BIOPSY: - CHRONIC ILEITIS WITH MILD ACTIVITY. - NEGATIVE FOR GRANULOMA, DYSPLASIA, AND MALIGNANCY.  B. 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 - NEGATIVE FOR GRANULOMA, DYSPLASIA, AND MALIGNANCY.  C. COLON, TRANSVERSE; COLD BIOPSY: - CHRONIC COLITIS WITHOUT ACTIVITY. - NEGATIVE FOR GRANULOMA, DYSPLASIA, AND MALIGNANCY.  D. COLON, DESCENDING; COLD BIOPSY: - CHRONIC COLITIS WITHOUT ACTIVITY. - NEGATIVE FOR GRANULOMA, DYSPLA SIA, AND  MALIGNANCY.  E. COLON, RECTOSIGMOID; COLD BIOPSY: - CHRONIC COLITIS WITHOUT ACTIVITY. - NEGATIVE FOR GRANULOMA, DYSPLASIA, AND MALIGNANCY.  GROSS DESCRIPTION: A. Labeled: cbx terminal ileum intestine stricture Received: Formalin Collection time: 9:41 AM on 08/28/2021 Placed into formalin time: 9:41 AM on 08/28/2021 Tissue fragment(s): 2 Size: Each 0.4 cm Description: Tan soft tissue fragments Entirely submitted in 1 cassette.  B. Labeled: cbx cecum and ascending colon Received: Formalin Collection time: 9:43 AM on 08/28/2021 Placed into formalin time: 9:43 AM on 08/28/2021 Tissue fragment(s): 3 Size: Aggregate, 0.7 x 0.3 x 0.2 cm Description: Tan soft tissue fragments Entirely submitted in 1 cassette.  C. Labeled: cbx transverse colon Received: Formalin Collection time: 9:48 AM on 08/28/2021 Placed into formalin time: 9:48 AM on 08/28/2021 Tissue fragment(s): 2 Size: Range from 0.4-0.5 cm Description: Tan soft tissue fragments Entirely submitted in 1  cassette.  D. Labeled: cbx descending colon Received: Formalin Collection time: 9:52 AM on 08/28/2021 Placed into formalin time: 9:52 AM on 08/28/2021 Tissue fragment(s): 2 Size: Range from 0.4-0.5 cm Description: Tan soft tissue fragments Entirely submitted in 1 cassette.  E. Labeled: cbx sigmoid colon and rectum Received: Formalin Collection time: 9:53 AM on 08/28/2021 Placed into formalin time: 9:53 AM on 08/28/2021 Tissue fragment(s): 2 Size: Range from 0.3-0.4 cm Description: Tan soft tissue fragments Entirely submitted in 1 cassette.  RB 08/28/2021  Final Diagnosis performed by Allena Napoleon, MD.   Electronically signed 08/29/2021 2:06:35PM The electronic signature indicates that the named Attending Pathologist has evaluated the specimen Technical component performed at [facility], [address], [place], [place] [address] Lab: [phone] Dir: Rush Farmer, MD, MMM  Professional component performed at [facility], [facility], [place], [place], [place] [address] Lab: [phone] Dir: Kathi Simpers, MD       Assessment & Plan:   Problem List Items Addressed This Visit       Other   Morbid obesity ([place])    Encouraged diet and exercise. Call with any concerns. Continue to monitor.  Severe recurrent major depression without psychotic features [facility])    Following with psychiatry. Not doing well. Due to see them again on 12/9. Call with any concerns or if not getting better.       Relevant Medications   hydrOXYzine (ATARAX) 50 MG tablet   Other Visit Diagnoses     Peripheral edema    -  Primary   Lungs clear. Compression hose. Concern for rheum issue. Will r/o DVT and check labs. Treat with prednisone taper- no pitting. Appt w Rheum scheduled.    Relevant Orders   Comprehensive metabolic panel   CBC with Differential/Platelet   TSH   VITAMIN D 25 Hydroxy (Vit-D Deficiency, Fractures)   B Nat Peptide   QuantiFERON-TB Gold Plus   US Venous Img Lower Bilateral   Chronic night sweats       Concern for medication effect. Will check labs and get her back into rheumatology. Await resutls. Treat as needed.    Relevant Orders   CBC with Differential/Platelet   QuantiFERON-TB Gold Plus        Follow up plan: Return in about 4 weeks (around 10/24/2021).

## 2021-09-26 NOTE — Patient Instructions (Addendum)
Wednesday December 7th 2:45 Dr. Posey Pronto  [address], [place], [place] [address] Phone: [phone]  Leg Ultrasounds: [id number]  [place]

## 2021-09-27 ENCOUNTER — Ambulatory Visit
Admission: RE | Admit: 2021-09-27 | Discharge: 2021-09-27 | Disposition: A | Discharge status: Home / Self Care | Payer: Medicaid Other | Source: Ambulatory Visit | Attending: Family Medicine | Admitting: Family Medicine

## 2021-09-27 ENCOUNTER — Other Ambulatory Visit: Payer: Self-pay

## 2021-09-27 DIAGNOSIS — R609 Edema, unspecified: Secondary | ICD-10-CM | POA: Insufficient documentation

## 2021-09-27 DIAGNOSIS — R600 Localized edema: Secondary | ICD-10-CM | POA: Diagnosis not present

## 2021-09-29 LAB — CBC WITH DIFFERENTIAL/PLATELET
Basophils Absolute: 0.1 10*3/uL (ref 0.0–0.2)
Basos: 1 %
EOS (ABSOLUTE): 0.1 10*3/uL (ref 0.0–0.4)
Eos: 1 %
Hematocrit: 42.4 % (ref 34.0–46.6)
Hemoglobin: 13.8 g/dL (ref 11.1–15.9)
Immature Grans (Abs): 0 10*3/uL (ref 0.0–0.1)
Immature Granulocytes: 0 %
Lymphocytes Absolute: 3.8 10*3/uL — ABNORMAL HIGH (ref 0.7–3.1)
Lymphs: 37 %
MCH: 30.8 pg (ref 26.6–33.0)
MCHC: 32.5 g/dL (ref 31.5–35.7)
MCV: 95 fL (ref 79–97)
Monocytes Absolute: 0.8 10*3/uL (ref 0.1–0.9)
Monocytes: 7 %
Neutrophils Absolute: 5.6 10*3/uL (ref 1.4–7.0)
Neutrophils: 54 %
Platelets: 356 10*3/uL (ref 150–450)
RBC: 4.48 x10E6/uL (ref 3.77–5.28)
RDW: 11.9 % (ref 11.7–15.4)
WBC: 10.3 10*3/uL (ref 3.4–10.8)

## 2021-09-29 LAB — QUANTIFERON-TB GOLD PLUS
QuantiFERON Mitogen Value: >10 IU/mL
QuantiFERON Nil Value: 0.02 IU/mL
QuantiFERON TB1 Ag Value: 0.03 IU/mL
QuantiFERON TB2 Ag Value: 0.03 IU/mL
QuantiFERON-TB Gold Plus: NEGATIVE

## 2021-09-29 LAB — COMPREHENSIVE METABOLIC PANEL
ALT: 18 IU/L (ref 0–32)
AST: 16 IU/L (ref 0–40)
Albumin/Globulin Ratio: 1.5 (ref 1.2–2.2)
Albumin: 4.3 g/dL (ref 3.8–4.8)
Alkaline Phosphatase: 58 IU/L (ref 44–121)
BUN/Creatinine Ratio: 11 (ref 9–23)
BUN: 9 mg/dL (ref 6–20)
Bilirubin Total: 0.2 mg/dL (ref 0.0–1.2)
CO2: 19 mmol/L — ABNORMAL LOW (ref 20–29)
Calcium: 9.4 mg/dL (ref 8.7–10.2)
Chloride: 105 mmol/L (ref 96–106)
Creatinine, Ser: 0.81 mg/dL (ref 0.57–1.00)
Globulin, Total: 2.9 g/dL (ref 1.5–4.5)
Glucose: 67 mg/dL — ABNORMAL LOW (ref 70–99)
Potassium: 4 mmol/L (ref 3.5–5.2)
Sodium: 139 mmol/L (ref 134–144)
Total Protein: 7.2 g/dL (ref 6.0–8.5)
eGFR: 95 mL/min/{1.73_m2} (ref >59)

## 2021-09-29 LAB — TSH: TSH: 1.03 u[IU]/mL (ref 0.450–4.500)

## 2021-09-29 LAB — VITAMIN D 25 HYDROXY (VIT D DEFICIENCY, FRACTURES): Vit D, 25-Hydroxy: 17 ng/mL — ABNORMAL LOW (ref 30.0–100.0)

## 2021-09-29 LAB — BRAIN NATRIURETIC PEPTIDE: BNP: 30.8 pg/mL (ref 0.0–100.0)

## 2021-09-30 ENCOUNTER — Ambulatory Visit: Payer: Medicaid Other | Admitting: Family Medicine

## 2021-10-01 ENCOUNTER — Encounter: Payer: Self-pay | Admitting: Family Medicine

## 2021-10-01 ENCOUNTER — Other Ambulatory Visit: Payer: Self-pay | Admitting: Family Medicine

## 2021-10-01 MED ORDER — VITAMIN D (ERGOCALCIFEROL) 1.25 MG (50000 UNIT) PO CAPS: 50000.0000 [IU] | ORAL_CAPSULE | ORAL | 1 refills | Status: DC

## 2021-10-24 ENCOUNTER — Ambulatory Visit: Payer: Medicaid Other | Admitting: Family Medicine

## 2021-10-28 ENCOUNTER — Encounter: Payer: Self-pay | Admitting: Gastroenterology

## 2021-10-29 NOTE — Telephone Encounter (Signed)
PA Case: [id number], Status: Approved, Coverage Starts on: 10/29/2021 12:00:00 AM, Coverage Ends on: 10/29/2022 12:00:00 AM.

## 2021-11-01 ENCOUNTER — Other Ambulatory Visit: Payer: Self-pay | Admitting: Gastroenterology

## 2021-11-04 NOTE — Telephone Encounter (Signed)
Last office visit 08/01/21 Crohn's disease  Last refill 05/29/21 0 refills  No appointment is schedule

## 2021-11-04 NOTE — Telephone Encounter (Signed)
Imuran is prescribed by her rheumatologist. She has to request Dr Posey Pronto for refills  RV

## 2021-11-11 ENCOUNTER — Telehealth: Payer: Self-pay

## 2021-12-18 ENCOUNTER — Other Ambulatory Visit: Payer: Self-pay

## 2021-12-18 ENCOUNTER — Ambulatory Visit: Payer: Medicaid Other | Admitting: Gastroenterology

## 2021-12-18 ENCOUNTER — Encounter: Payer: Self-pay | Admitting: Gastroenterology

## 2021-12-18 VITALS — BP 144/85 | HR 88 | Temp 98.7°F | Wt 232.0 lb

## 2021-12-18 DIAGNOSIS — K56699 Other intestinal obstruction unspecified as to partial versus complete obstruction: Secondary | ICD-10-CM | POA: Diagnosis not present

## 2021-12-18 DIAGNOSIS — K5080 Crohn's disease of both small and large intestine without complications: Secondary | ICD-10-CM | POA: Diagnosis not present

## 2021-12-18 DIAGNOSIS — F17200 Nicotine dependence, unspecified, uncomplicated: Secondary | ICD-10-CM | POA: Diagnosis not present

## 2021-12-18 DIAGNOSIS — E559 Vitamin D deficiency, unspecified: Secondary | ICD-10-CM

## 2021-12-18 NOTE — Patient Instructions (Addendum)
We will send a referral to dermatology. They will contact you to schedule your appointment.  Please arrive to the [facility] at 3:00 PM. Please do not eat or drink 4 hours before your MRI.

## 2021-12-18 NOTE — Progress Notes (Signed)
Cheryl Bellows MD, MRCP(U.K) [address]  [place]  [place], [place] [address]  Main: [phone]  Fax: [phone]   Primary Care Physician: [name], DO  Primary Gastroenterologist:  Dr. Jonathon Flowers   Chief Complaint  Patient presents with   Crohn's Disease    HPI: Cheryl Flowers is a 41 y.o. female   Summary of history : She is a patient who used to see Dr. Bonna Gains and last seen her in October 2022.  History of ileocolonic Crohn's disease diagnosed in 2021 been on Entyvio since July 2022 .  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  She had symptoms of nausea headaches while being on Imuran.  Had some mood changes and was concerned if the [facility] was causing the same.  Colonoscopy in June 2022 showed stricture of the IC valve and decreased vascularity in the cecum.  Biopsies of the terminal ileum showed mild chronic active enteritis.  Biopsies of the cecum and ascending colon showed chronic active colitis that was mild similarly the transverse rectum and descending colon showed inactive colitis Mentioned that in 2021 Humira was discontinued as she developed an ilial stricture.  MRI enterogram in 2021 showed only involvement of the distal ileum and cecum.  No evidence of abscess or other complications.  Interval history October 2022-12/18/2021  She is unclear as to vitamin D.  But it is possibly due to making her very fatigued and tired but it is unclear why she was changed back to Humira by Dr. Bonna Gains especially since it had failed previously 09/26/2021: TB QuantiFERON negative, vitamin D low, TSH 1.03, hemoglobin 13.8 g, LFTs normal 08/28/2021 colonoscopy: Severe stenosis of the terminal ileum was not traversed biopsies taken patchy area of mildly altered vascular mucosa seen in the ascending colon and cecum biopsies taken otherwise examination was normal biopsies taken throughout the colon  pathology showed chronic colitis with mild activity and right colon mild colitis with minimal activity ,rest of the colon showed no activity.  Presently her main symptoms of gas bloating abdominal distention and feeling a sense of fullness in her abdomen some abdominal discomfort.  Has been taking naproxen occasionally for headaches She has completed 4 doses of Humira  Current Outpatient Medications  Medication Sig Dispense Refill   albuterol (VENTOLIN HFA) 108 (90 Base) MCG/ACT inhaler Inhale 2 puffs into the lungs every 6 (six) hours as needed for wheezing or shortness of breath. 8 g 11   docusate sodium (COLACE) 100 MG capsule Take 100 mg by mouth daily.     HUMIRA PEN 40 MG/0.4ML PNKT SMARTSIG:40 Milligram(s) SUB-Q Every 2 Weeks     norethindrone (AYGESTIN) 5 MG tablet Take 1 tablet by mouth daily.     No current facility-administered medications for this visit.    Allergies as of 12/18/2021 - Review Complete 12/18/2021  Allergen Reaction Noted   Morphine and related Itching 12/15/2015   Sulfa antibiotics Hives 12/15/2015    ROS:  General: Negative for anorexia, weight loss, fever, chills, fatigue, weakness. ENT: Negative for hoarseness, difficulty swallowing , nasal congestion. CV: Negative for chest pain, angina, palpitations, dyspnea on exertion, peripheral edema.  Respiratory: Negative for dyspnea at rest, dyspnea on exertion, cough, sputum, wheezing.  GI: See history of present illness. GU:  Negative for dysuria, hematuria, urinary incontinence, urinary frequency, nocturnal urination.  Endo: Negative for unusual weight change.    Physical Examination:   BP Marland Kitchen)  144/85    Pulse 88    Temp 98.7 F (37.1 C) (Oral)    Wt 232 lb (105.2 kg)    BMI 39.21 kg/m   General: Well-nourished, well-developed in no acute distress.  Eyes: No icterus. Conjunctivae pink. Mouth: Oropharyngeal mucosa moist and pink , no lesions erythema or exudate. Lungs: Clear to auscultation bilaterally.  Non-labored. Heart: Regular rate and rhythm, no murmurs rubs or gallops.  Abdomen: Bowel sounds are normal, nontender, nondistended, no hepatosplenomegaly or masses, no abdominal bruits or hernia , no rebound or guarding.   Extremities: No lower extremity edema. No clubbing or deformities. Neuro: Alert and oriented x 3.  Grossly intact. Skin: Warm and dry, no jaundice.   Psych: Alert and cooperative, normal mood and affect.   Imaging Studies: No results found.  Assessment and Plan:   Cheryl Flowers is a 41 y.o. 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  Unclear at this point of time by Dr. Bonna Gains changed back to Humira rather than any other class of medication as Humira had failed previously.Marland Kitchen  MRI in 2021 showed only the distal ileum and cecum being affected which has been confirmed on biopsies as well.  Up-to-date with pneumococcal vaccine, Tdap.  She is immune to hepatitis B.  Not check hepatitis A immunity status.  Previously had low vitamin D presently she has symptoms suggestive of possible intermittent small bowel obstruction from the high-grade stricture.  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   Plan 1.  Calcium and vitamin D supplementation, high-dose vitamin D supplementation to continue which she is taking 2.  Check hepatitis A levels 3.  Annual skin exam with dermatology referral 4.  Annual Pap smear with GYN or PCP 5.  Recommend to stop smoking as it can cause exacerbation of her Crohn's  disease 6.  check B12 levels 7.  MR enterogram to evaluate the ileocecal stricture and to check for any chronic changes such as proximal dilation. 8.  I have suggested to her that surgery may be a good option for her to resect the terminal ileum, ileocecal valve and cecum and could put her into remission especially if she can stop smoking 9.  At next visit we will need to discuss if it is reasonable to continue the Humira or should be changed to another class of medication such as Stelara 10.  Stop all NSAID use  Dr Cheryl Bellows  MD,MRCP [facility]) Follow up in 2-4 weeks has not yet had the MRI scan

## 2021-12-19 LAB — CBC WITH DIFFERENTIAL/PLATELET
Basophils Absolute: 0.1 10*3/uL (ref 0.0–0.2)
Basos: 1 %
EOS (ABSOLUTE): 0.1 10*3/uL (ref 0.0–0.4)
Eos: 1 %
Hematocrit: 40.5 % (ref 34.0–46.6)
Hemoglobin: 14.1 g/dL (ref 11.1–15.9)
Immature Grans (Abs): 0 10*3/uL (ref 0.0–0.1)
Immature Granulocytes: 0 %
Lymphocytes Absolute: 4 10*3/uL — ABNORMAL HIGH (ref 0.7–3.1)
Lymphs: 39 %
MCH: 32 pg (ref 26.6–33.0)
MCHC: 34.8 g/dL (ref 31.5–35.7)
MCV: 92 fL (ref 79–97)
Monocytes Absolute: 0.6 10*3/uL (ref 0.1–0.9)
Monocytes: 6 %
Neutrophils Absolute: 5.5 10*3/uL (ref 1.4–7.0)
Neutrophils: 53 %
Platelets: 331 10*3/uL (ref 150–450)
RBC: 4.41 x10E6/uL (ref 3.77–5.28)
RDW: 12.4 % (ref 11.7–15.4)
WBC: 10.3 10*3/uL (ref 3.4–10.8)

## 2021-12-19 LAB — HEPATITIS A ANTIBODY, TOTAL: hep A Total Ab: NEGATIVE

## 2021-12-19 LAB — B12 AND FOLATE PANEL
Folate: 6.7 ng/mL (ref >3.0)
Vitamin B-12: 758 pg/mL (ref 232–1245)

## 2021-12-19 LAB — C-REACTIVE PROTEIN: CRP: 8 mg/L (ref 0–10)

## 2021-12-23 ENCOUNTER — Telehealth: Payer: Self-pay

## 2021-12-23 NOTE — Telephone Encounter (Signed)
Did a PA for patient upcoming MRI that is schedule for 12/30/2021. When I submitted pa online and it said the provider was out of network. Called patient insurance company and it said that Dr. Vicente Males was out of network on patient insurance and but Dr. Marius Ditch is in network. Please advised what you want patient to do. Do you want patient to transfer care to Dr. Marius Ditch and do the PA for the MRI through Dr. Marius Ditch

## 2021-12-23 NOTE — Telephone Encounter (Signed)
Called and left a detail message informing patient and sent her a mychart message. Got MRI approved with Dr. Marius Ditch name and change mychart visit to Dr. Marius Ditch

## 2021-12-23 NOTE — Telephone Encounter (Signed)
Okay with me  RV

## 2021-12-23 NOTE — Telephone Encounter (Signed)
Are you okay with seeing this patient and Do you want me to see if I can get the MRI approved under you

## 2021-12-26 ENCOUNTER — Encounter: Payer: Self-pay | Admitting: Family Medicine

## 2021-12-26 DIAGNOSIS — F410 Panic disorder [episodic paroxysmal anxiety] without agoraphobia: Secondary | ICD-10-CM

## 2021-12-26 DIAGNOSIS — F331 Major depressive disorder, recurrent, moderate: Secondary | ICD-10-CM

## 2021-12-26 DIAGNOSIS — F988 Other specified behavioral and emotional disorders with onset usually occurring in childhood and adolescence: Secondary | ICD-10-CM

## 2021-12-30 ENCOUNTER — Other Ambulatory Visit: Payer: Self-pay

## 2021-12-30 ENCOUNTER — Ambulatory Visit
Admission: RE | Admit: 2021-12-30 | Discharge: 2021-12-30 | Disposition: A | Discharge status: Home / Self Care | Payer: Medicaid Other | Source: Ambulatory Visit | Attending: Gastroenterology | Admitting: Gastroenterology

## 2021-12-30 DIAGNOSIS — D259 Leiomyoma of uterus, unspecified: Secondary | ICD-10-CM | POA: Diagnosis not present

## 2021-12-30 DIAGNOSIS — K5080 Crohn's disease of both small and large intestine without complications: Secondary | ICD-10-CM | POA: Diagnosis not present

## 2021-12-30 DIAGNOSIS — K5090 Crohn's disease, unspecified, without complications: Secondary | ICD-10-CM | POA: Diagnosis not present

## 2021-12-30 DIAGNOSIS — K56699 Other intestinal obstruction unspecified as to partial versus complete obstruction: Secondary | ICD-10-CM | POA: Diagnosis not present

## 2021-12-30 DIAGNOSIS — K5900 Constipation, unspecified: Secondary | ICD-10-CM | POA: Diagnosis not present

## 2021-12-30 DIAGNOSIS — R140 Abdominal distension (gaseous): Secondary | ICD-10-CM | POA: Diagnosis not present

## 2021-12-30 MED ORDER — GADOBUTROL 1 MMOL/ML IV SOLN
10.0000 mL | Freq: Once | INTRAVENOUS | Status: AC | PRN
  Administered 2021-12-30: 10 mL via INTRAVENOUS

## 2021-12-31 ENCOUNTER — Encounter: Payer: Self-pay | Admitting: Gastroenterology

## 2021-12-31 ENCOUNTER — Telehealth (INDEPENDENT_AMBULATORY_CARE_PROVIDER_SITE_OTHER): Payer: Medicaid Other | Admitting: Gastroenterology

## 2021-12-31 DIAGNOSIS — K50012 Crohn's disease of small intestine with intestinal obstruction: Secondary | ICD-10-CM | POA: Diagnosis not present

## 2021-12-31 MED ORDER — SHINGRIX 50 MCG/0.5ML IM SUSR: 0.5000 mL | Freq: Once | INTRAMUSCULAR | 0 refills | Status: AC

## 2021-12-31 MED ORDER — RIFAXIMIN 550 MG PO TABS: 550.0000 mg | ORAL_TABLET | Freq: Three times a day (TID) | ORAL | 0 refills | Status: AC

## 2021-12-31 NOTE — Progress Notes (Signed)
[name], MD [address]  [place]  [place], [place] [address]  Main: [phone]  Fax: [phone]    Gastroenterology Consultation Video Visit  Referring Provider:     [name], DO Primary Care Physician:  [name], DO Primary Gastroenterologist:  Dr. Cephas Darby Reason for Consultation:     Ileocolonic Crohn's disease        HPI:   Cheryl Flowers is a 41 y.o. female referred by Dr. Wynetta Emery, [name], DO  for consultation & management of ileocolonic Crohn's disease  Virtual Visit Video Note  I connected with Cheryl Flowers on 12/31/21 at  2:30 PM EST by video and verified that I am speaking with the correct person using two identifiers.   I discussed the limitations, risks, security and privacy concerns of performing an evaluation and management service by video and the availability of in person appointments. I also discussed with the patient that there may be a patient responsible charge related to this service. The patient expressed understanding and agreed to proceed.  Location of the Patient: Home  Location of the provider: Office  Persons participating in the visit: Patient and provider only   History of Present Illness:  Patient is a 41 year old African-American female with ileocolonic Crohn's disease, currently maintained on Humira monotherapy, experiencing right lower quadrant discomfort associated with abdominal distention, constipation  Crohn's disease classification:  Age: 40 to 40 Location: Ileocolonic Behavior: stricturing Perianal: no  IBD diagnosis: Ileocolonic Crohn's disease, stricturing phenotype diagnosed based on the index colonoscopy in May 2021  Disease course:Patient reports that she has been longstanding symptoms of blood in the stool, right lower quadrant pain, abdominal distention, diagnosed with ileocolonic Crohn's disease with a terminal ileal stricture based on the colonoscopy in May  2021.  She was initially on budesonide for about a month, later on prednisone followed by initiation of Humira in July 2021.  Repeat colonoscopy revealed persistent stricture of the terminal ileum and therefore Humira was stopped and switched to Entyvio.  Prior to stopping Humira, patient was started on combination therapy, Imuran was added and was managed by her rheumatologist due to diagnosis of sacroiliitis.  Patient developed nausea, headache on Imuran and therefore discontinued it.  Patient was then switched to [facility] since June 2022, she was concerned about mood changes if these are associated with We[facility] Rodney, therefore this was stopped and switched to Humira again since January 2023.  Patient underwent colonoscopy every 6 to 8 months to assess response to biologic, she did have persistent TI stricture, however, MR enterography since her diagnosis showed mild wall thickening with mucosal enhancement involving the distal ileum and cecum including the ileocecal valve.  She underwent 3 MR enterography studies to date which revealed consistent findings as above.  Patient is currently on Humira monotherapy every other week.  Fecal calprotectin levels was 227 in 07/2020, improved to 289 in 09/2020, most recently to 37 in 4/22.  CRP also nicely improved since diagnosis, most recently 8  Extra intestinal manifestations: Sacroiliitis  IBD surgical history: None  Imaging:  MRE  04/02/2020 IMPRESSION: 1. Mild wall thickening and mucosal enhancement involving the distal ileum and cecum. This is consistent with Crohn's disease. 2. No evidence of abscess or other complication. 3. Small uterine fibroids. 4. Uterine fibroids.  02/17/2021 IMPRESSION: 1. Short segment of terminal ileum with mild wall thickening and mucosal enhancement noted at the level of the ileal cecal valve. There is also mild wall thickening  and enhancement involving the cecum. Findings consistent with Crohn's inflammation.  No complications identified. Specifically, there is no abscess, obstruction or sign of penetrating disease. No evidence for penetrating disease or abscess. 2. Mildly increased caliber and enhancement of the proximal appendix which tapers to a normal caliber mid and distal appendix. Favor secondary inflammation. 3. Uterine fibroids.  12/30/2021 IMPRESSION: No significant change in short-segment wall thickening and enhancement involving the terminal ileum and ileocecal valve, consistent with history of Crohn disease. No evidence of penetrating disease or other complication.  CTE none  SBFT none  Procedures:  Index colonoscopy 03/09/2020 - Mucosal ulceration. Biopsied. - Patchy moderate inflammation was found in the cecum, rule out Crohn's disease. Biopsied. - Patchy mild inflammation was found in the transverse colon and in the ascending colon, rule out Crohn's disease. Biopsied. - The descending colon is normal. - Erythematous mucosa in the rectum and in the sigmoid colon. Biopsied. - The distal rectum and anal verge are normal on retroflexion view. DIAGNOSIS:  A. COLON, CECUM; COLD BIOPSY:  - CHRONIC COLITIS WITH FOCAL MILD ACTIVITY.  - NEGATIVE FOR DYSPLASIA AND MALIGNANCY.   B. ILEOCECAL VALVE; COLD BIOPSY:  - CHRONIC ENTERITIS / COLITIS WITH MODERATE ACTIVITY AND ULCERATION.  - NEGATIVE FOR DYSPLASIA AND MALIGNANCY.   C. COLON, ASCENDING; COLD BIOPSY:  - CHRONIC COLITIS WITH MODERATE ACTIVITY.  - NEGATIVE FOR DYSPLASIA AND MALIGNANCY.   D. COLON, TRANSVERSE; COLD BIOPSY:  - CHRONIC COLITIS WITH MODERATE ACTIVITY.  - NEGATIVE FOR DYSPLASIA AND MALIGNANCY.   E. COLON, DESCENDING; COLD BIOPSY:  - CHRONIC INACTIVE COLITIS.  - NEGATIVE FOR DYSPLASIA AND MALIGNANCY.   F. COLON, RECTOSIGMOID; COLD BIOPSY:  - CHRONIC INACTIVE COLITIS.  - NEGATIVE FOR DYSPLASIA AND MALIGNANCY.    Colonoscopy 07/27/2020 - The examined portion of the ileum was normal. Biopsied. - Crohn's  disease. Inflammation was found in the transverse colon, in the ascending colon and at the cecum. This was moderate in severity, worsened compared to previous examinations. Biopsied. - The distal rectum and anal verge are normal on retroflexion view. DIAGNOSIS:  A. COLON, RIGHT; COLD BIOPSY:  - MILD AND FOCALLY MODERATE CHRONIC ACTIVE COLITIS INVOLVING ALL BIOPSY  FRAGMENTS, CONSISTENT WITH PATIENT'S KNOWN HISTORY OF CROHNS.  - FOCAL NON-NECROTIZING GRANULOMAS; AFB AND PASF STAINS ARE NEGATIVE;  STAIN CONTROLS WORKED APPROPRIATELY.  - NEGATIVE FOR DYSPLASIA AND MALIGNANCY.   B.  TERMINAL ILEUM; COLD BIOPSY:  - UNREMARKABLE SMALL INTESTINAL MUCOSA.  - NEGATIVE FOR ACTIVE ENTERITIS, FEATURES OF CHRONICITY, DYSPLASIA, AND  MALIGNANCY.   C.  COLON, LEFT; COLD BIOPSY:  - CHRONIC INACTIVE COLITIS INVOLVING A MINORITY OF THE BIOPSY FRAGMENTS,  CONSISTENT WITH PATIENT'S KNOWN HISTORY OF CROHNS.  - NEGATIVE FOR DYSPLASIA AND MALIGNANCY.   Colonoscopy 03/27/2021 - Stricture at the ileocecal valve. Biopsied. - Decreased mucosa vascular pattern in the cecum. Biopsied. - One 5 mm polyp in the sigmoid colon, removed with a cold snare. Resected and retrieved. - The rectum, sigmoid colon, descending colon, transverse colon and ascending colon are normal. Biopsied. DIAGNOSIS:  A. TERMINAL ILEUM; COLD BIOPSY:  - MILD CHRONIC ACTIVE ENTERITIS CONSISTENT WITH PATIENT'S HISTORY OF  CROHN'S DISEASE.  - NEGATIVE FOR DYSPLASIA AND MALIGNANCY.   B.  COLON, CECUM AND ASCENDING; COLD BIOPSY:  - MILD CHRONIC ACTIVE COLITIS CONSISTENT WITH PATIENT'S KNOWN HISTORY OF  CROHN'S.  - NEGATIVE FOR DYSPLASIA AND MALIGNANCY.   C.  COLON, TRANSVERSE; COLD BIOPSY:  - PATCHY CHRONIC INACTIVE COLITIS CONSISTENT WITH PATIENT'S HISTORY OF  CROHN'S.  -  NEGATIVE FOR DYSPLASIA AND MALIGNANCY.   D.  COLON, DESCENDING; COLD BIOPSY:  - PATCHY CHRONIC INACTIVE COLITIS CONSISTENT WITH PATIENT'S HISTORY OF  CROHN'S.  -  NEGATIVE FOR DYSPLASIA AND MALIGNANCY.   E.  COLON, RECTOSIGMOID; COLD BIOPSY:  - PATCHY CHRONIC INACTIVE COLITIS CONSISTENT WITH PATIENT'S HISTORY OF  CROHN'S.  - NEGATIVE FOR DYSPLASIA AND MALIGNANCY.    F.  COLON POLYP, SIGMOID; COLD SNARE:  - HYPERPLASTIC POLYP.  - NEGATIVE FOR DYSPLASIA AND MALIGNANCY.   Colonoscopy 08/28/2021 - Stricture in the terminal ileum. Biopsied. - Altered vascular mucosa in the ascending colon and in the cecum. Biopsied. - The rectum, sigmoid colon, descending colon and transverse colon are normal. Biopsied. DIAGNOSIS:  A. TERMINAL ILEUM; COLD BIOPSY:  - CHRONIC ILEITIS WITH MILD ACTIVITY.  - NEGATIVE FOR GRANULOMA, DYSPLASIA, AND MALIGNANCY.   B. COLON, ASCENDING AND CECUM; COLD BIOPSY:  - CHRONIC COLITIS WITH MINIMAL ACTIVITY (FOCAL SUPERFICIAL CRYPTITIS).  - NEGATIVE FOR GRANULOMA, DYSPLASIA, AND MALIGNANCY.   C. COLON, TRANSVERSE; COLD BIOPSY:  - CHRONIC COLITIS WITHOUT ACTIVITY.  - NEGATIVE FOR GRANULOMA, DYSPLASIA, AND MALIGNANCY.   D. COLON, DESCENDING; COLD BIOPSY:  - CHRONIC COLITIS WITHOUT ACTIVITY.  - NEGATIVE FOR GRANULOMA, DYSPLASIA, AND MALIGNANCY.   E. COLON, RECTOSIGMOID; COLD BIOPSY:  - CHRONIC COLITIS WITHOUT ACTIVITY.  - NEGATIVE FOR GRANULOMA, DYSPLASIA, AND MALIGNANCY.   Upper Endoscopy none  VCE none  IBD medications:  Steroids: Prednisone, budesonide 5-ASA: None Immunomodulators: AZA, methotrexate TPMT status: normal Biologics:  Anti TNFs: Humira July 2021 to June 2022, restarted in January 2023 Anti Integrins: Entyvio from June 2022 to October 2022, stopped secondary to mood changes Ustekinumab: Tofactinib: Clinical trial:   NSAIDs: None  Antiplts/Anticoagulants/Anti thrombotics: None  Past Medical History:  Diagnosis Date   Anxiety    Crohn's disease ([place])    Depression    Low back pain    Marital conflict    Panic disorder    Thyromegaly     Past Surgical History:  Procedure Laterality Date    CESAREAN SECTION  2003 and 2010   CHOLECYSTECTOMY  2007   COLONOSCOPY WITH PROPOFOL N/A 03/07/2020   Procedure: COLONOSCOPY WITH PROPOFOL;  Surgeon: Virgel Manifold, MD;  Location: [facility];  Service: Endoscopy;  Laterality: N/A;   COLONOSCOPY WITH PROPOFOL N/A 07/27/2020   Procedure: COLONOSCOPY WITH PROPOFOL;  Surgeon: Virgel Manifold, MD;  Location: [facility] END[facility];  Service: Endoscopy;  Laterality: N/A;   COLONOSCOPY WITH PROPOFOL N/A 03/27/2021   Procedure: COLONOSCOPY WITH PROPOFOL;  Surgeon: Virgel Manifold, MD;  Location: [facility] END[facility];  Service: Endoscopy;  Laterality: N/A;   COLONOSCOPY WITH PROPOFOL N/A 08/28/2021   Procedure: COLONOSCOPY WITH PROPOFOL;  Surgeon: Virgel Manifold, MD;  Location: [facility] ENDOSCOPY;  Service: Endoscopy;  Laterality: N/A;   TUBAL LIGATION  2010   Current Outpatient Medications:    albuterol (VENTOLIN HFA) 108 (90 Base) MCG/ACT inhaler, Inhale 2 puffs into the lungs every 6 (six) hours as needed for wheezing or shortness of breath., Disp: 8 g, Rfl: 11   docusate sodium (COLACE) 100 MG capsule, Take 100 mg by mouth daily., Disp: , Rfl:    HUMIRA PEN 40 MG/0.4ML PNKT, SMARTSIG:40 Milligram(s) SUB-Q Every 2 Weeks, Disp: , Rfl:    norethindrone (AYGESTIN) 5 MG tablet, Take 1 tablet by mouth daily., Disp: , Rfl:    rifaximin (XIFAXAN) 550 MG TABS tablet, Take 1 tablet (550 mg total) by mouth 3 (three) times daily for 14 days., Disp: 42 tablet, Rfl:  0   Vitamin D, Ergocalciferol, (DRISDOL) 1.25 MG (50000 UNIT) CAPS capsule, Take by mouth., Disp: , Rfl:    Zoster Vaccine Adjuvanted (SHINGRIX) injection, Inject 0.5 mLs into the muscle once for 1 dose., Disp: 1 each, Rfl: 0  Family History  Problem Relation Age of Onset   Kidney disease Mother    Hypertension Mother    Rheum arthritis Mother    Ankylosing spondylitis Mother    Hypertension Father    Diabetes Father    Breast cancer Maternal Aunt        early 37   Crohn's disease  Maternal Aunt    Lupus Maternal Aunt    Heart attack Maternal Grandmother    Stroke Maternal Grandfather      Social History   Tobacco Use   Smoking status: Former    Packs/day: 0.25    Years: 19.00    Pack years: 4.75    Types: Cigarettes    Quit date: 09/17/2020    Years since quitting: 1.2   Smokeless tobacco: Never  Vaping Use   Vaping Use: Former  Substance Use Topics   Alcohol use: Not Currently   Drug use: No    Allergies as of 12/31/2021 - Review Complete 12/31/2021  Allergen Reaction Noted   Morphine and related Itching 12/15/2015   Sulfa antibiotics Hives 12/15/2015   Imaging Studies: Reviewed  Assessment and Plan:   Cheryl Flowers is a 41 y.o. African-American female with history of ileocolonic Crohn's disease, stricturing phenotype diagnosed in May 2021, treated with budesonide, prednisone followed by Humira and temporarily on combination therapy with Imuran, later switched to [facility], switched back to Humira.  Ileocolonic Crohn's disease with stricturing phenotype Most recent colonoscopy from November 2022 revealed inactive colitis in transverse colon and left colon, minimal activity in the ascending colon, has terminal ileal stricture, unable to traverse, chronic active ileitis Continue Humira every other week Recommend Xifaxan 550 mg 3 times daily for 2 weeks Patient's fecal calprotectin levels and CRP were normal Patient has undetectable Humira antibodies, therapeutic Humira trough levels I am concerned about symptoms secondary to TI stricture and she has the stricture since her diagnosis of her Crohn's disease and it is persistent despite being on biologic therapy for at least 2 years indicating that there might be a component of fibro stenosis.  Therefore, I discussed with patient regarding resection of the TI stricture and then reinitiation of biologic to prevent postop recurrence of Crohn's disease.  She agreed with the plan.  Will refer her to  colorectal surgeon, Dr. Victorio Palm at [facility]  1.TB status: QuantiFERON gold in 09/2021 negative 2. Anemia: None 3.Immunizations: Hep A antibody negative and B immune/reactive, Influenza recommend annual influenza vaccine, prevnar recommend, pneumovax received, Varicella unknown, Zoster recommend Shingrix vaccine 4.Cancer screening I) Colon cancer/dysplasia surveillance: None II) Cervical cancer: Pap smear up-to-date III) Skin cancer - counseled about annual skin exam by dermatology and skin protection in summer using sun screen SPF > 50, clothing 5.Bone health Vitamin D status: Mild vitamin D deficiency, continue vitamin D supplements Bone density testing: Not performed 5. Labs: Every 3 months 6. Smoking: None 7. NSAIDs and Antibiotics use: None    Follow Up Instructions:   I discussed the assessment and treatment plan with the patient. The patient was provided an opportunity to ask questions and all were answered. The patient agreed with the plan and demonstrated an understanding of the instructions.   The patient was advised to call  back or seek an in-person evaluation if the symptoms worsen or if the condition fails to improve as anticipated.  I provided 25 minutes of face-to-face time during this encounter.   Follow up in 3 months   Cephas Darby, MD

## 2022-01-01 ENCOUNTER — Telehealth: Payer: Self-pay

## 2022-01-01 DIAGNOSIS — K50012 Crohn's disease of small intestine with intestinal obstruction: Secondary | ICD-10-CM

## 2022-01-01 NOTE — Telephone Encounter (Signed)
Did PA on Xifaxan through cover my meds. Waiting on response from insurance  ?

## 2022-01-01 NOTE — Telephone Encounter (Signed)
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 ?RV ? ?

## 2022-01-01 NOTE — Telephone Encounter (Signed)
Faxed referral to doctor office at [phone]  ?

## 2022-01-01 NOTE — Telephone Encounter (Signed)
Insurance company approved medication  ?

## 2022-01-10 ENCOUNTER — Encounter: Payer: Self-pay | Admitting: Gastroenterology

## 2022-01-14 ENCOUNTER — Ambulatory Visit: Payer: Medicaid Other | Admitting: Primary Care

## 2022-01-15 DIAGNOSIS — R0602 Shortness of breath: Secondary | ICD-10-CM | POA: Diagnosis not present

## 2022-01-15 DIAGNOSIS — R140 Abdominal distension (gaseous): Secondary | ICD-10-CM | POA: Diagnosis not present

## 2022-01-15 DIAGNOSIS — G43909 Migraine, unspecified, not intractable, without status migrainosus: Secondary | ICD-10-CM | POA: Diagnosis not present

## 2022-01-15 DIAGNOSIS — F17210 Nicotine dependence, cigarettes, uncomplicated: Secondary | ICD-10-CM | POA: Diagnosis not present

## 2022-01-15 DIAGNOSIS — F900 Attention-deficit hyperactivity disorder, predominantly inattentive type: Secondary | ICD-10-CM | POA: Diagnosis not present

## 2022-01-15 DIAGNOSIS — R141 Gas pain: Secondary | ICD-10-CM | POA: Diagnosis not present

## 2022-01-15 DIAGNOSIS — Z7951 Long term (current) use of inhaled steroids: Secondary | ICD-10-CM | POA: Diagnosis not present

## 2022-01-15 DIAGNOSIS — Z882 Allergy status to sulfonamides status: Secondary | ICD-10-CM | POA: Diagnosis not present

## 2022-01-15 DIAGNOSIS — Z885 Allergy status to narcotic agent status: Secondary | ICD-10-CM | POA: Diagnosis not present

## 2022-01-15 DIAGNOSIS — K50918 Crohn's disease, unspecified, with other complication: Secondary | ICD-10-CM | POA: Diagnosis not present

## 2022-01-15 DIAGNOSIS — J45909 Unspecified asthma, uncomplicated: Secondary | ICD-10-CM | POA: Diagnosis not present

## 2022-01-20 ENCOUNTER — Telehealth: Payer: Self-pay | Admitting: Family Medicine

## 2022-01-20 NOTE — Telephone Encounter (Signed)
Patient has an appointment on 3/30 the spouse dropped off a [id number] Request for independent assessment for personal care services. For the provider to complete in advance Paper was placed in the provider folder for completion.  ?

## 2022-01-21 NOTE — Telephone Encounter (Signed)
Will place in provider form ?

## 2022-01-21 NOTE — Telephone Encounter (Signed)
Placed forms in the incomplete bin.  ?

## 2022-01-21 NOTE — Telephone Encounter (Signed)
Will not be filled out until after her appointment.  ?

## 2022-01-22 DIAGNOSIS — J984 Other disorders of lung: Secondary | ICD-10-CM | POA: Diagnosis not present

## 2022-01-22 DIAGNOSIS — K219 Gastro-esophageal reflux disease without esophagitis: Secondary | ICD-10-CM | POA: Diagnosis not present

## 2022-01-22 DIAGNOSIS — F17200 Nicotine dependence, unspecified, uncomplicated: Secondary | ICD-10-CM | POA: Diagnosis not present

## 2022-01-22 DIAGNOSIS — Z01818 Encounter for other preprocedural examination: Secondary | ICD-10-CM | POA: Diagnosis not present

## 2022-01-22 DIAGNOSIS — R0602 Shortness of breath: Secondary | ICD-10-CM | POA: Diagnosis not present

## 2022-01-23 ENCOUNTER — Ambulatory Visit: Payer: Medicaid Other | Admitting: Family Medicine

## 2022-01-23 ENCOUNTER — Encounter: Payer: Self-pay | Admitting: Family Medicine

## 2022-01-23 DIAGNOSIS — K5080 Crohn's disease of both small and large intestine without complications: Secondary | ICD-10-CM | POA: Diagnosis not present

## 2022-01-23 DIAGNOSIS — F410 Panic disorder [episodic paroxysmal anxiety] without agoraphobia: Secondary | ICD-10-CM | POA: Diagnosis not present

## 2022-01-23 DIAGNOSIS — K50919 Crohn's disease, unspecified, with unspecified complications: Secondary | ICD-10-CM | POA: Diagnosis not present

## 2022-01-23 DIAGNOSIS — F411 Generalized anxiety disorder: Secondary | ICD-10-CM | POA: Diagnosis not present

## 2022-01-23 DIAGNOSIS — K50812 Crohn's disease of both small and large intestine with intestinal obstruction: Secondary | ICD-10-CM | POA: Diagnosis not present

## 2022-01-23 DIAGNOSIS — K5010 Crohn's disease of large intestine without complications: Secondary | ICD-10-CM | POA: Diagnosis not present

## 2022-01-23 DIAGNOSIS — Z20822 Contact with and (suspected) exposure to covid-19: Secondary | ICD-10-CM | POA: Diagnosis not present

## 2022-01-23 DIAGNOSIS — F331 Major depressive disorder, recurrent, moderate: Secondary | ICD-10-CM | POA: Diagnosis not present

## 2022-01-23 DIAGNOSIS — F988 Other specified behavioral and emotional disorders with onset usually occurring in childhood and adolescence: Secondary | ICD-10-CM

## 2022-01-23 NOTE — Progress Notes (Signed)
BP 115/81   Pulse 80   Temp 98.2 F (36.8 C)   Wt 235 lb (106.6 kg)   SpO2 98%   BMI 39.71 kg/m    Subjective:    Patient ID: Cheryl Flowers, female    DOB: 1980/11/21, 41 y.o.   MRN: [id number]  HPI: Cheryl Flowers is a 41 y.o. female  Chief Complaint  Patient presents with   Form Completion    Patient here for forms to be completed for Personal Care Services, patient has surgery coming up on Monday.    ADD    Patient states she was being seen at [facility] and was taken off her Adderall. Patient states she would like to discuss medication    Having a laparoscopy next week. To be having a stricture removed. She is anxious about it.   She has been restarted on humeria and has not been feeling well.   Has not been seeing psychiatry. She felt like they were not very helpful. She has planned to get back into see [facility], but hasn't seen them yet. Feels like her mood has not been doing well. She notes this past week has been ok but the past couple of months has not been doing well.   DEPRESSION Mood status: uncontrolled Satisfied with current treatment?: no Symptom severity: moderate  Duration of current treatment :  working with psychiatry to adjust her medicine Side effects: yes Medication compliance: good compliance Psychotherapy/counseling: yes  Depressed mood: yes Anxious mood: yes Anhedonia: no Significant weight loss or gain: no Insomnia: yes  Fatigue: yes Feelings of worthlessness or guilt: yes Impaired concentration/indecisiveness: yes Suicidal ideations: no Hopelessness: no Crying spells: no    01/23/2022   11:34 AM 09/26/2021    8:58 AM 04/22/2021    8:51 AM 11/05/2020    9:04 AM 04/05/2020    9:54 AM  Depression screen PHQ 2/9  Decreased Interest 2 2 3  0 1  Down, Depressed, Hopeless 2 2 3 1 1   PHQ - 2 Score 4 4 6 1 2   Altered sleeping 1 3 3 1 2   Tired, decreased energy 2 3 3  0 2  Change in appetite 2 0 1 0 1  Feeling bad or failure about  yourself  2 2 1  0 0  Trouble concentrating 3 3 1 1 2   Moving slowly or fidgety/restless 1 2 0 0 0  Suicidal thoughts 0 1 1 0 0  PHQ-9 Score 15 18 16 3 9   Difficult doing work/chores  Very difficult Extremely dIfficult Not difficult at all Very difficult   Will need help with personal care after her surgery. Would like paperwork filled out. No other concerns or complaints at this time.   Relevant past medical, surgical, family and social history reviewed and updated as indicated. Interim medical history since our last visit reviewed. Allergies and medications reviewed and updated.  Review of Systems  Constitutional:  Positive for fatigue. Negative for activity change, appetite change, chills, diaphoresis, fever and unexpected weight change.  Respiratory: Negative.    Cardiovascular: Negative.   Gastrointestinal:  Positive for abdominal pain. Negative for abdominal distention, anal bleeding, blood in stool, constipation, diarrhea, nausea, rectal pain and vomiting.  Musculoskeletal:  Positive for arthralgias, back pain and myalgias. Negative for gait problem, joint swelling, neck pain and neck stiffness.  Skin: Negative.   Neurological: Negative.   Psychiatric/Behavioral:  Positive for dysphoric mood. Negative for agitation, behavioral problems, confusion, decreased concentration, hallucinations, self-injury, sleep disturbance and suicidal  ideas. The patient is nervous/anxious. The patient is not hyperactive.    Per HPI unless specifically indicated above     Objective:    BP 115/81   Pulse 80   Temp 98.2 F (36.8 C)   Wt 235 lb (106.6 kg)   SpO2 98%   BMI 39.71 kg/m   Wt Readings from Last 3 Encounters:  01/23/22 235 lb (106.6 kg)  12/18/21 232 lb (105.2 kg)  09/26/21 230 lb (104.3 kg)    Physical Exam Vitals and nursing note reviewed.  Constitutional:      General: She is not in acute distress.    Appearance: Normal appearance. She is not ill-appearing, toxic-appearing or  diaphoretic.  HENT:     Head: Normocephalic and atraumatic.     Right Ear: External ear normal.     Left Ear: External ear normal.     Nose: Nose normal.     Mouth/Throat:     Mouth: Mucous membranes are moist.     Pharynx: Oropharynx is clear.  Eyes:     General: No scleral icterus.       Right eye: No discharge.        Left eye: No discharge.     Extraocular Movements: Extraocular movements intact.     Conjunctiva/sclera: Conjunctivae normal.     Pupils: Pupils are equal, round, and reactive to light.  Cardiovascular:     Rate and Rhythm: Normal rate and regular rhythm.     Pulses: Normal pulses.     Heart sounds: Normal heart sounds. No murmur heard.   No friction rub. No gallop.  Pulmonary:     Effort: Pulmonary effort is normal. No respiratory distress.     Breath sounds: Normal breath sounds. No stridor. No wheezing, rhonchi or rales.  Chest:     Chest wall: No tenderness.  Musculoskeletal:        General: Normal range of motion.     Cervical back: Normal range of motion and neck supple.  Skin:    General: Skin is warm and dry.     Capillary Refill: Capillary refill takes less than 2 seconds.     Coloration: Skin is not jaundiced or pale.     Findings: No bruising, erythema, lesion or rash.  Neurological:     General: No focal deficit present.     Mental Status: She is alert and oriented to person, place, and time. Mental status is at baseline.  Psychiatric:        Mood and Affect: Mood normal.        Behavior: Behavior normal.        Thought Content: Thought content normal.        Judgment: Judgment normal.    Results for orders placed or performed in visit on 12/18/21  B12 and Folate Panel  Result Value Ref Range   Vitamin B-12 758 232 - 1,245 pg/mL   Folate 6.7 >3.0 ng/mL  Hepatitis A Ab, Total  Result Value Ref Range   hep A Total Ab Negative Negative  CBC with Differential/Platelet  Result Value Ref Range   WBC 10.3 3.4 - 10.8 x10E3/uL   RBC 4.41 3.77  - 5.28 x10E6/uL   Hemoglobin 14.1 11.1 - 15.9 g/dL   Hematocrit 65.7 84.6 - 46.6 %   MCV 92 79 - 97 fL   MCH 32.0 26.6 - 33.0 pg   MCHC 34.8 31.5 - 35.7 g/dL   RDW 96.2 95.2 - 84.1 %  Platelets 331 150 - 450 x10E3/uL   Neutrophils 53 Not Estab. %   Lymphs 39 Not Estab. %   Monocytes 6 Not Estab. %   Eos 1 Not Estab. %   Basos 1 Not Estab. %   Neutrophils Absolute 5.5 1.4 - 7.0 x10E3/uL   Lymphocytes Absolute 4.0 (H) 0.7 - 3.1 x10E3/uL   Monocytes Absolute 0.6 0.1 - 0.9 x10E3/uL   EOS (ABSOLUTE) 0.1 0.0 - 0.4 x10E3/uL   Basophils Absolute 0.1 0.0 - 0.2 x10E3/uL   Immature Granulocytes 0 Not Estab. %   Immature Grans (Abs) 0.0 0.0 - 0.1 x10E3/uL  C-reactive protein  Result Value Ref Range   CRP 8 0 - 10 mg/L      Assessment & Plan:   Problem List Items Addressed This Visit       Digestive   Crohn's disease of colon without complication (HCC)    Will try to get her personal aid. Paperwork filled out today. To have surgery shortly. Call with any concerns.       Crohn's disease of both small and large intestine without complication (HCC)    Will try to get her personal aid. Paperwork filled out today. To have surgery shortly. Call with any concerns.       Crohn's disease of both small and large intestine with intestinal obstruction (HCC)    Will try to get her personal aid. Paperwork filled out today. To have surgery shortly. Call with any concerns.         Other   Generalized anxiety disorder with panic attacks    Not doing well. Has been following with psychiatry. Continue to monitor. Call with any concerns.       Relevant Medications   buPROPion (WELLBUTRIN XL) 300 MG 24 hr tablet   Attention deficit disorder (ADD) in adult    Not doing well. Has been following with psychiatry. Continue to monitor. Call with any concerns.       Moderate episode of recurrent major depressive disorder (HCC)    Not doing well. Has been following with psychiatry. Continue to  monitor. Call with any concerns.       Relevant Medications   buPROPion (WELLBUTRIN XL) 300 MG 24 hr tablet   Crohn disease (HCC)    Will try to get her personal aid. Paperwork filled out today. To have surgery shortly. Call with any concerns.         Follow up plan: Return in about 3 months (around 04/25/2022).   >30 minutes spent with patient today

## 2022-01-24 ENCOUNTER — Encounter: Payer: Self-pay | Admitting: Family Medicine

## 2022-01-24 NOTE — Assessment & Plan Note (Signed)
Will try to get her personal aid. Paperwork filled out today. To have surgery shortly. Call with any concerns.  ?

## 2022-01-24 NOTE — Assessment & Plan Note (Signed)
Not doing well. Has been following with psychiatry. Continue to monitor. Call with any concerns.  ?

## 2022-01-27 DIAGNOSIS — F900 Attention-deficit hyperactivity disorder, predominantly inattentive type: Secondary | ICD-10-CM | POA: Diagnosis not present

## 2022-01-27 DIAGNOSIS — J45909 Unspecified asthma, uncomplicated: Secondary | ICD-10-CM | POA: Diagnosis not present

## 2022-01-27 DIAGNOSIS — Z7951 Long term (current) use of inhaled steroids: Secondary | ICD-10-CM | POA: Diagnosis not present

## 2022-01-27 DIAGNOSIS — K50112 Crohn's disease of large intestine with intestinal obstruction: Secondary | ICD-10-CM | POA: Diagnosis not present

## 2022-01-27 DIAGNOSIS — K219 Gastro-esophageal reflux disease without esophagitis: Secondary | ICD-10-CM | POA: Diagnosis not present

## 2022-01-27 DIAGNOSIS — Z882 Allergy status to sulfonamides status: Secondary | ICD-10-CM | POA: Diagnosis not present

## 2022-01-27 DIAGNOSIS — G43909 Migraine, unspecified, not intractable, without status migrainosus: Secondary | ICD-10-CM | POA: Diagnosis not present

## 2022-01-27 DIAGNOSIS — F419 Anxiety disorder, unspecified: Secondary | ICD-10-CM | POA: Diagnosis not present

## 2022-01-27 DIAGNOSIS — Z79899 Other long term (current) drug therapy: Secondary | ICD-10-CM | POA: Diagnosis not present

## 2022-01-27 DIAGNOSIS — Z87891 Personal history of nicotine dependence: Secondary | ICD-10-CM | POA: Diagnosis not present

## 2022-01-27 DIAGNOSIS — K50918 Crohn's disease, unspecified, with other complication: Secondary | ICD-10-CM | POA: Diagnosis not present

## 2022-01-27 DIAGNOSIS — Z6841 Body Mass Index (BMI) 40.0 and over, adult: Secondary | ICD-10-CM | POA: Diagnosis not present

## 2022-01-27 DIAGNOSIS — Z886 Allergy status to analgesic agent status: Secondary | ICD-10-CM | POA: Diagnosis not present

## 2022-01-31 ENCOUNTER — Telehealth: Payer: Self-pay

## 2022-01-31 NOTE — Telephone Encounter (Signed)
Transition Care Management Follow-up Telephone Call ?Date of discharge and from where: 01/30/2022-[facility]  ?How have you been since you were released from the hospital? Pt stated she is doing good.  ?Any questions or concerns? No ? ?Items Reviewed: ?Did the pt receive and understand the discharge instructions provided? Yes  ?Medications obtained and verified? Yes  ?Other? No  ?Any new allergies since your discharge? No  ?Dietary orders reviewed? No ?Do you have support at home? Yes  ? ?Home Care and Equipment/Supplies: ?Were home health services ordered? not applicable ?If so, what is the name of the agency? N/A  ?Has the agency set up a time to come to the patient's home? not applicable ?Were any new equipment or medical supplies ordered?  No ?What is the name of the medical supply agency? N/A ?Were you able to get the supplies/equipment? not applicable ?Do you have any questions related to the use of the equipment or supplies? No ? ?Functional Questionnaire: (I = Independent and D = Dependent) ?ADLs: I ? ?Bathing/Dressing- I ? ?Meal Prep- I ? ?Eating- I ? ?Maintaining continence- I ? ?Transferring/Ambulation- I ? ?Managing Meds- I ? ?Follow up appointments reviewed: ? ?PCP Hospital f/u appt confirmed? No   ?Specialist Hospital f/u appt confirmed? Yes  Scheduled to see General Surgery on 02/18/2022. ?Are transportation arrangements needed? No  ?If their condition worsens, is the pt aware to call PCP or go to the Emergency Dept.? Yes ?Was the patient provided with contact information for the PCP's office or ED? Yes ?Was to pt encouraged to call back with questions or concerns? Yes  ?

## 2022-02-04 DIAGNOSIS — K50918 Crohn's disease, unspecified, with other complication: Secondary | ICD-10-CM | POA: Diagnosis not present

## 2022-02-06 ENCOUNTER — Encounter: Payer: Self-pay | Admitting: Family Medicine

## 2022-02-07 DIAGNOSIS — K50918 Crohn's disease, unspecified, with other complication: Secondary | ICD-10-CM | POA: Diagnosis not present

## 2022-02-07 DIAGNOSIS — K5090 Crohn's disease, unspecified, without complications: Secondary | ICD-10-CM | POA: Diagnosis not present

## 2022-02-07 DIAGNOSIS — R609 Edema, unspecified: Secondary | ICD-10-CM | POA: Diagnosis not present

## 2022-02-07 DIAGNOSIS — M799 Soft tissue disorder, unspecified: Secondary | ICD-10-CM | POA: Diagnosis not present

## 2022-02-11 ENCOUNTER — Telehealth: Payer: Self-pay | Admitting: Family Medicine

## 2022-02-11 NOTE — Telephone Encounter (Signed)
Can we please reopen this referral?  ?

## 2022-02-11 NOTE — Telephone Encounter (Signed)
Patient called requesting  appointment for psychiatry. Reviewed chart and referral was closed on 01/29/2022 due to patient refusing services. Instructed patient to obtain new referral and once received, we can call to schedule. ?

## 2022-02-11 NOTE — Telephone Encounter (Signed)
It looks like she did not fill out her new patient paperwork or schedule her new patient appointment. Please confirm that she got the new patient paperwork- fill it out and let us know when she does and we'll see if we can reopen the referral.  ? ?Referral team- please hold on reopening referral until we hear she has filled out the paperwork ?

## 2022-02-11 NOTE — Telephone Encounter (Signed)
Patient states she has completed paperwork and sent back already. Would like to proceed with referral.  ?

## 2022-02-11 NOTE — Telephone Encounter (Signed)
Please reopen referral- she has sent back her paperwork ?

## 2022-02-18 DIAGNOSIS — K50012 Crohn's disease of small intestine with intestinal obstruction: Secondary | ICD-10-CM | POA: Diagnosis not present

## 2022-02-18 DIAGNOSIS — Z09 Encounter for follow-up examination after completed treatment for conditions other than malignant neoplasm: Secondary | ICD-10-CM | POA: Diagnosis not present

## 2022-03-12 ENCOUNTER — Telehealth: Payer: Self-pay | Admitting: Family Medicine

## 2022-03-12 NOTE — Telephone Encounter (Signed)
Clenton Pare care manager w/ [employer] is going to re fax a form for [facility]. ?Pt is under [facility] and needs to be sent to them for pt to have these services. ?Please be on the look out. ? cb [phone] ? ?

## 2022-03-13 NOTE — Telephone Encounter (Signed)
Form in provider folder to complete.

## 2022-03-14 ENCOUNTER — Telehealth: Payer: Self-pay | Admitting: Gastroenterology

## 2022-03-14 NOTE — Telephone Encounter (Signed)
Pt is having concerns about colonoscopy follow up would like a call back

## 2022-03-14 NOTE — Telephone Encounter (Signed)
Patient states she got discharge from the [facility] a follow up appointment. Made appointment

## 2022-03-25 NOTE — Telephone Encounter (Signed)
[employer] fax number is [phone] please fax form back asap

## 2022-03-28 NOTE — Telephone Encounter (Signed)
Faxed over form to [facility]

## 2022-04-01 IMAGING — DX DG LUMBAR SPINE COMPLETE 4+V
5 series · 5 of 5 positions shown · non-contrast
Comparison: 04/11/2019, 11/02/2019

CLINICAL DATA: Right hip and low back pain

EXAM:
LUMBAR SPINE - COMPLETE 4+ VIEW

[l-spine ap]
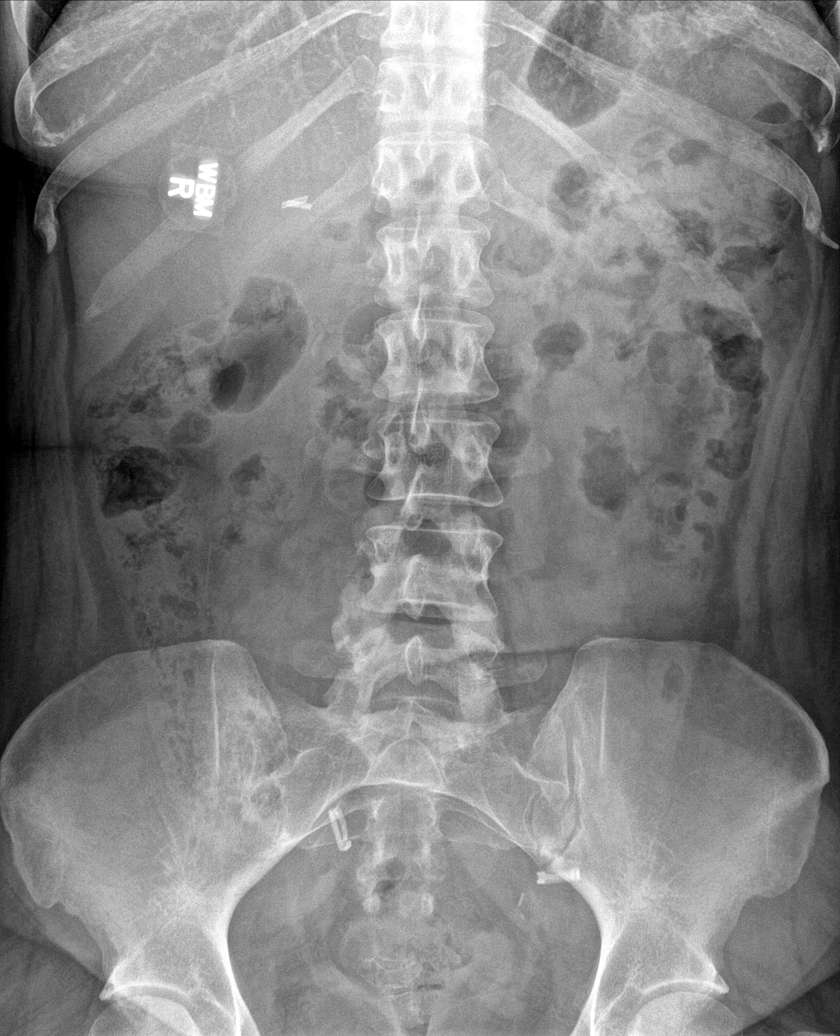

[l-spine obl (1 of 2)]
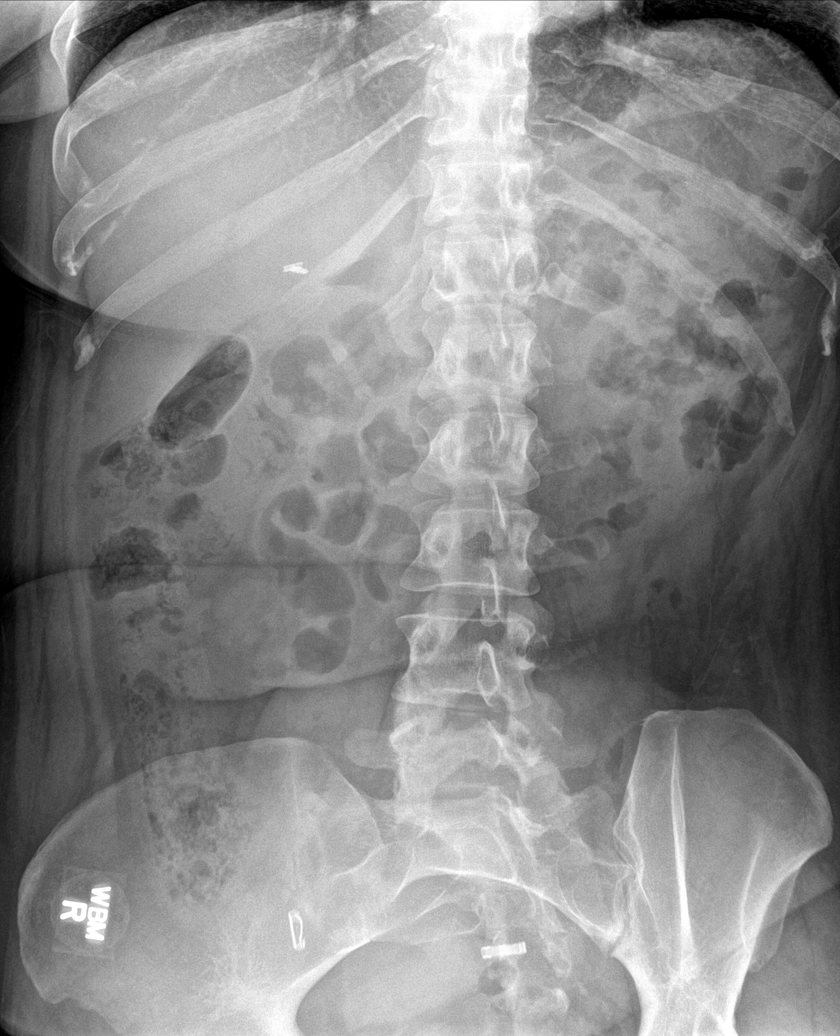

[l-spine obl (2 of 2)]
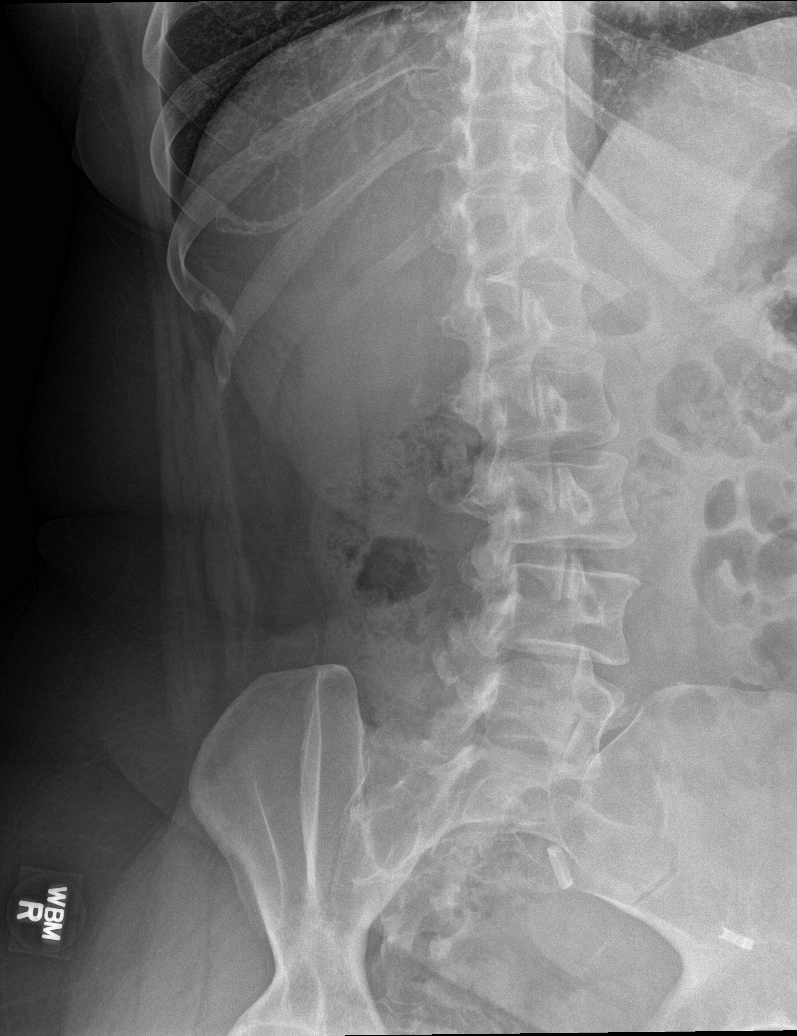

[l-spine lat]
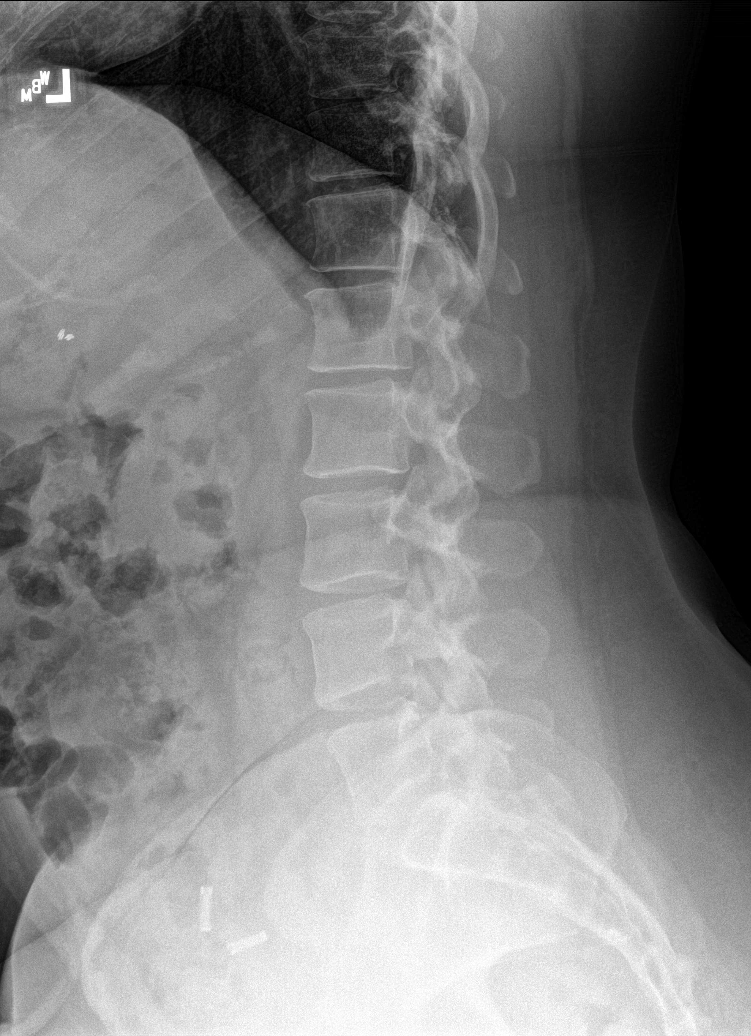

[l-spine spot]
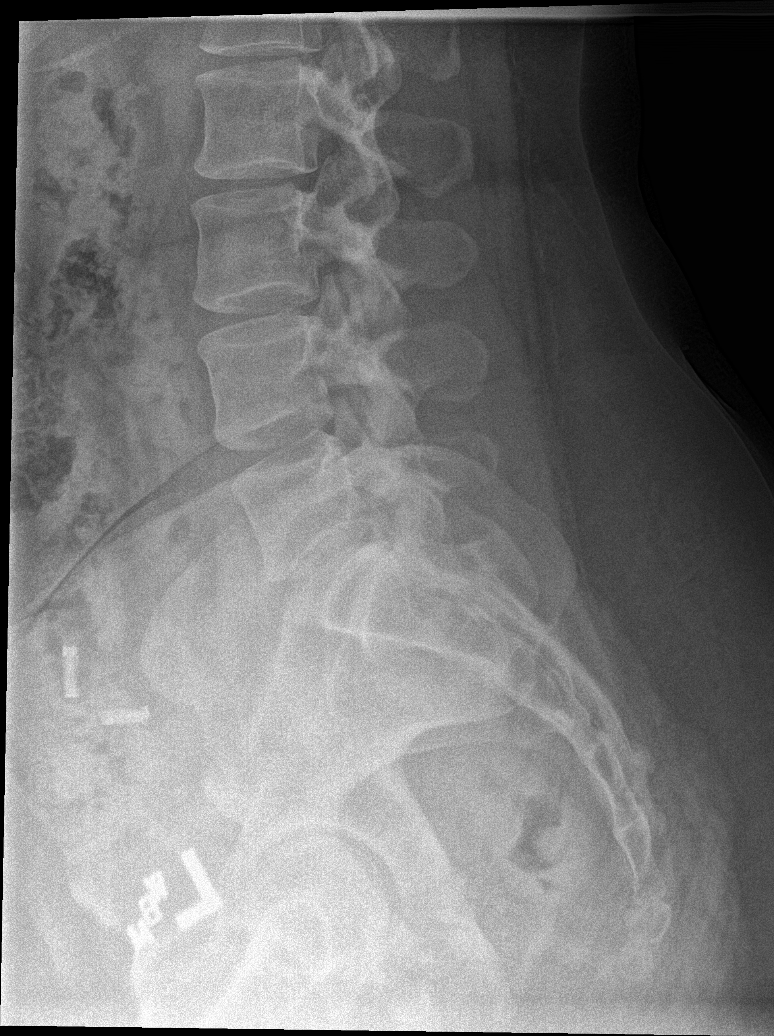

[5 of 5 positions shown; findings below may reference images not displayed]

FINDINGS: Frontal, bilateral oblique, lateral views of the lumbar spine are
obtained. There are 5 non-rib-bearing lumbar type vertebral bodies
identified, with stable mild left convex scoliosis centered at L3.
Otherwise alignment is anatomic. There are no acute displaced
fractures. Mild facet hypertrophic changes are seen at L4-5 and
L5-S1. Disc spaces are well preserved.

T chronic irregularity of the right sacroiliac joint consistent with
sacroiliitis. There is progressive ankylosis since prior study.

Ubal ligation clips are seen within the pelvis.
IMPRESSION: 1. Stable mild left convex scoliosis and mild lower lumbar facet
hypertrophy.
2. Continued asymmetric sacroiliitis on the right. Progressive
ankylosis since prior study.

## 2022-04-14 ENCOUNTER — Other Ambulatory Visit: Payer: Self-pay

## 2022-04-14 ENCOUNTER — Ambulatory Visit: Payer: Medicaid Other | Admitting: Gastroenterology

## 2022-04-14 ENCOUNTER — Encounter: Payer: Self-pay | Admitting: Gastroenterology

## 2022-04-14 VITALS — BP 129/78 | HR 86 | Temp 99.1°F | Ht 64.5 in | Wt 228.5 lb

## 2022-04-14 DIAGNOSIS — K50812 Crohn's disease of both small and large intestine with intestinal obstruction: Secondary | ICD-10-CM | POA: Diagnosis not present

## 2022-04-14 DIAGNOSIS — K50012 Crohn's disease of small intestine with intestinal obstruction: Secondary | ICD-10-CM

## 2022-04-14 DIAGNOSIS — R1013 Epigastric pain: Secondary | ICD-10-CM

## 2022-04-14 MED ORDER — NA SULFATE-K SULFATE-MG SULF 17.5-3.13-1.6 GM/177ML PO SOLN: 354.0000 mL | Freq: Once | ORAL | 0 refills | Status: AC

## 2022-04-14 NOTE — Progress Notes (Signed)
Cheryl Darby, MD [address]  [place]  [place], [place] [address]  Main: [phone]  Fax: [phone]    Gastroenterology Consultation  Referring Provider:     [name], DO Primary Care Physician:  [name], DO Primary Gastroenterologist:  Dr. Cephas Flowers Reason for Consultation: Ileocolonic Crohn's disease        HPI:   Cheryl Flowers is a 41 y.o. female referred by Dr. Wynetta Emery, [name], DO  for consultation & management of small bowel Crohn's  Patient is here for his postoperative management of Crohn's disease.  Patient reports abdominal bloating as well as epigastric pain sharp burning in nature.  She was suffering from constipation, currently having regular bowel movements since she was on antibiotics for her dental infection recently.  Patient is currently not on any biologic agents since her terminal ileal resection in April 2023.  Her weight has been stable.  Patient denies any rectal bleeding.  Crohn's disease classification:   Age: 16 to 70 Location: Ileocolonic Behavior: stricturing Perianal: no   IBD diagnosis: Ileocolonic Crohn's disease, stricturing phenotype diagnosed based on the index colonoscopy in May 2021   Disease course:Patient reports that she has been longstanding symptoms of blood in the stool, right lower quadrant pain, abdominal distention, diagnosed with ileocolonic Crohn's disease with a terminal ileal stricture based on the colonoscopy in May 2021.  She was initially on budesonide for about a month, later on prednisone followed by initiation of Humira in July 2021.  Repeat colonoscopy revealed persistent stricture of the terminal ileum and therefore Humira was stopped and switched to Entyvio.  Prior to stopping Humira, patient was started on combination therapy, Imuran was added and was managed by her rheumatologist due to diagnosis of sacroiliitis.  Patient developed nausea, headache on Imuran and therefore  discontinued it.  Patient was then switched to [facility] since June 2022, she was concerned about mood changes if these are associated with We[facility] Rodney, therefore this was stopped and switched to Humira again since January 2023.  Patient underwent colonoscopy every 6 to 8 months to assess response to biologic, she did have persistent TI stricture, however, MR enterography since her diagnosis showed mild wall thickening with mucosal enhancement involving the distal ileum and cecum including the ileocecal valve.  She underwent 3 MR enterography studies to date which revealed consistent findings as above.  Patient was on Humira monotherapy every other week until she underwent a laparoscopic ileocolonic resection on 01/29/2022 by Dr. Victorio Palm at [facility], 6.6 cm of the TI and 6.3 cm of the colon were removed with primary anastomosis.  Patient's postop recovery was uneventful.  She did have some abdominal bloating, underwent repeat CT scanning of the abdomen and pelvis with contrast on 02/07/2022 which revealed multifocal areas of narrowing in the small bowel which may represent short segment strictures versus peristalsis.   Fecal calprotectin levels was 227 in 07/2020, improved to 289 in 09/2020, most recently to 37 in 4/22.  CRP also nicely improved since diagnosis, most recently 8   Extra intestinal manifestations: Sacroiliitis   IBD surgical history: None   Imaging:   MRE   04/02/2020 IMPRESSION: 1. Mild wall thickening and mucosal enhancement involving the distal ileum and cecum. This is consistent with Crohn's disease. 2. No evidence of abscess or other complication. 3. Small uterine fibroids. 4. Uterine fibroids.   02/17/2021 IMPRESSION: 1. Short segment of terminal ileum with mild wall thickening and mucosal enhancement noted at the  level of the ileal cecal valve. There is also mild wall thickening and enhancement involving the cecum. Findings consistent with Crohn's inflammation. No complications  identified. Specifically, there is no abscess, obstruction or sign of penetrating disease. No evidence for penetrating disease or abscess. 2. Mildly increased caliber and enhancement of the proximal appendix which tapers to a normal caliber mid and distal appendix. Favor secondary inflammation. 3. Uterine fibroids.   12/30/2021 IMPRESSION: No significant change in short-segment wall thickening and enhancement involving the terminal ileum and ileocecal valve, consistent with history of Crohn disease. No evidence of penetrating disease or other complication.   CTE none   SBFT none   Procedures:   Index colonoscopy 03/09/2020 - Mucosal ulceration. Biopsied. - Patchy moderate inflammation was found in the cecum, rule out Crohn's disease. Biopsied. - Patchy mild inflammation was found in the transverse colon and in the ascending colon, rule out Crohn's disease. Biopsied. - The descending colon is normal. - Erythematous mucosa in the rectum and in the sigmoid colon. Biopsied. - The distal rectum and anal verge are normal on retroflexion view. DIAGNOSIS:  A. COLON, CECUM; COLD BIOPSY:  - CHRONIC COLITIS WITH FOCAL MILD ACTIVITY.  - NEGATIVE FOR DYSPLASIA AND MALIGNANCY.   B. ILEOCECAL VALVE; COLD BIOPSY:  - CHRONIC ENTERITIS / COLITIS WITH MODERATE ACTIVITY AND ULCERATION.  - NEGATIVE FOR DYSPLASIA AND MALIGNANCY.   C. COLON, ASCENDING; COLD BIOPSY:  - CHRONIC COLITIS WITH MODERATE ACTIVITY.  - NEGATIVE FOR DYSPLASIA AND MALIGNANCY.   D. COLON, TRANSVERSE; COLD BIOPSY:  - CHRONIC COLITIS WITH MODERATE ACTIVITY.  - NEGATIVE FOR DYSPLASIA AND MALIGNANCY.   E. COLON, DESCENDING; COLD BIOPSY:  - CHRONIC INACTIVE COLITIS.  - NEGATIVE FOR DYSPLASIA AND MALIGNANCY.   F. COLON, RECTOSIGMOID; COLD BIOPSY:  - CHRONIC INACTIVE COLITIS.  - NEGATIVE FOR DYSPLASIA AND MALIGNANCY.      Colonoscopy 07/27/2020 - The examined portion of the ileum was normal. Biopsied. - Crohn's disease.  Inflammation was found in the transverse colon, in the ascending colon and at the cecum. This was moderate in severity, worsened compared to previous examinations. Biopsied. - The distal rectum and anal verge are normal on retroflexion view. DIAGNOSIS:  A. COLON, RIGHT; COLD BIOPSY:  - MILD AND FOCALLY MODERATE CHRONIC ACTIVE COLITIS INVOLVING ALL BIOPSY  FRAGMENTS, CONSISTENT WITH PATIENT'S KNOWN HISTORY OF CROHNS.  - FOCAL NON-NECROTIZING GRANULOMAS; AFB AND PASF STAINS ARE NEGATIVE;  STAIN CONTROLS WORKED APPROPRIATELY.  - NEGATIVE FOR DYSPLASIA AND MALIGNANCY.   B.  TERMINAL ILEUM; COLD BIOPSY:  - UNREMARKABLE SMALL INTESTINAL MUCOSA.  - NEGATIVE FOR ACTIVE ENTERITIS, FEATURES OF CHRONICITY, DYSPLASIA, AND  MALIGNANCY.   C.  COLON, LEFT; COLD BIOPSY:  - CHRONIC INACTIVE COLITIS INVOLVING A MINORITY OF THE BIOPSY FRAGMENTS,  CONSISTENT WITH PATIENT'S KNOWN HISTORY OF CROHNS.  - NEGATIVE FOR DYSPLASIA AND MALIGNANCY.    Colonoscopy 03/27/2021 - Stricture at the ileocecal valve. Biopsied. - Decreased mucosa vascular pattern in the cecum. Biopsied. - One 5 mm polyp in the sigmoid colon, removed with a cold snare. Resected and retrieved. - The rectum, sigmoid colon, descending colon, transverse colon and ascending colon are normal. Biopsied. DIAGNOSIS:  A. TERMINAL ILEUM; COLD BIOPSY:  - MILD CHRONIC ACTIVE ENTERITIS CONSISTENT WITH PATIENT'S HISTORY OF  CROHN'S DISEASE.  - NEGATIVE FOR DYSPLASIA AND MALIGNANCY.   B.  COLON, CECUM AND ASCENDING; COLD BIOPSY:  - MILD CHRONIC ACTIVE COLITIS CONSISTENT WITH PATIENT'S KNOWN HISTORY OF  CROHN'S.  - NEGATIVE FOR DYSPLASIA AND MALIGNANCY.  C.  COLON, TRANSVERSE; COLD BIOPSY:  - PATCHY CHRONIC INACTIVE COLITIS CONSISTENT WITH PATIENT'S HISTORY OF  CROHN'S.  - NEGATIVE FOR DYSPLASIA AND MALIGNANCY.   D.  COLON, DESCENDING; COLD BIOPSY:  - PATCHY CHRONIC INACTIVE COLITIS CONSISTENT WITH PATIENT'S HISTORY OF  CROHN'S.  - NEGATIVE FOR  DYSPLASIA AND MALIGNANCY.   E.  COLON, RECTOSIGMOID; COLD BIOPSY:  - PATCHY CHRONIC INACTIVE COLITIS CONSISTENT WITH PATIENT'S HISTORY OF  CROHN'S.  - NEGATIVE FOR DYSPLASIA AND MALIGNANCY.    F.  COLON POLYP, SIGMOID; COLD SNARE:  - HYPERPLASTIC POLYP.  - NEGATIVE FOR DYSPLASIA AND MALIGNANCY.    Colonoscopy 08/28/2021 - Stricture in the terminal ileum. Biopsied. - Altered vascular mucosa in the ascending colon and in the cecum. Biopsied. - The rectum, sigmoid colon, descending colon and transverse colon are normal. Biopsied. DIAGNOSIS:  A. TERMINAL ILEUM; COLD BIOPSY:  - CHRONIC ILEITIS WITH MILD ACTIVITY.  - NEGATIVE FOR GRANULOMA, DYSPLASIA, AND MALIGNANCY.   B. COLON, ASCENDING AND CECUM; COLD BIOPSY:  - CHRONIC COLITIS WITH MINIMAL ACTIVITY (FOCAL SUPERFICIAL CRYPTITIS).  - NEGATIVE FOR GRANULOMA, DYSPLASIA, AND MALIGNANCY.   C. COLON, TRANSVERSE; COLD BIOPSY:  - CHRONIC COLITIS WITHOUT ACTIVITY.  - NEGATIVE FOR GRANULOMA, DYSPLASIA, AND MALIGNANCY.   D. COLON, DESCENDING; COLD BIOPSY:  - CHRONIC COLITIS WITHOUT ACTIVITY.  - NEGATIVE FOR GRANULOMA, DYSPLASIA, AND MALIGNANCY.   E. COLON, RECTOSIGMOID; COLD BIOPSY:  - CHRONIC COLITIS WITHOUT ACTIVITY.  - NEGATIVE FOR GRANULOMA, DYSPLASIA, AND MALIGNANCY.    Upper Endoscopy none   VCE none   IBD medications:   Steroids: Prednisone, budesonide 5-ASA: None Immunomodulators: AZA, methotrexate TPMT status: normal Biologics:  Anti TNFs: Humira July 2021 to June 2022, restarted in January 2023 Anti Integrins: Entyvio from June 2022 to October 2022, stopped secondary to mood changes Ustekinumab: Tofactinib: Clinical trial:   NSAIDs: None   Antiplts/Anticoagulants/Anti thrombotics: None  Past Medical History:  Diagnosis Date   Anxiety    Crohn's disease ([employer])    Depression    Low back pain    Marital conflict    Panic disorder    Thyromegaly     Past Surgical History:  Procedure Laterality Date    CESAREAN SECTION  2003 and 2010   CHOLECYSTECTOMY  2007   COLONOSCOPY WITH PROPOFOL N/A 03/07/2020   Procedure: COLONOSCOPY WITH PROPOFOL;  Surgeon: Virgel Manifold, MD;  Location: [facility] ENDOSCOPY;  Service: Endoscopy;  Laterality: N/A;   COLONOSCOPY WITH PROPOFOL N/A 07/27/2020   Procedure: COLONOSCOPY WITH PROPOFOL;  Surgeon: Virgel Manifold, MD;  Location: [facility] ENDOSCOPY;  Service: Endoscopy;  Laterality: N/A;   COLONOSCOPY WITH PROPOFOL N/A 03/27/2021   Procedure: COLONOSCOPY WITH PROPOFOL;  Surgeon: Virgel Manifold, MD;  Location: [facility] ENDOSCOPY;  Service: Endoscopy;  Laterality: N/A;   COLONOSCOPY WITH PROPOFOL N/A 08/28/2021   Procedure: COLONOSCOPY WITH PROPOFOL;  Surgeon: Virgel Manifold, MD;  Location: [facility] ENDOSCOPY;  Service: Endoscopy;  Laterality: N/A;   TUBAL LIGATION  2010     Current Outpatient Medications:    acetaminophen (TYLENOL) 500 MG tablet, Take 1-2 tablets (500 mg - 1 g total) by mouth every 6 hours as needed for pain. Prescription not provided, medication is available over the counter. Take as directed on label, Disp: , Rfl:    albuterol (VENTOLIN HFA) 108 (90 Base) MCG/ACT inhaler, Inhale 2 puffs into the lungs every 6 (six) hours as needed for wheezing or shortness of breath., Disp: 8 g, Rfl: 11   budesonide-formoterol (SYMBICORT) 160-4.5 MCG/ACT inhaler,  Inhale into the lungs., Disp: , Rfl:    Na Sulfate-K Sulfate-Mg Sulf 17.5-3.13-1.6 GM/177ML SOLN, Take 354 mLs by mouth once for 1 dose., Disp: 354 mL, Rfl: 0   norethindrone (AYGESTIN) 5 MG tablet, Take 1 tablet by mouth daily., Disp: , Rfl:    pantoprazole (PROTONIX) 40 MG tablet, Take 40 mg by mouth daily., Disp: , Rfl:    Vitamin D, Ergocalciferol, (DRISDOL) 1.25 MG (50000 UNIT) CAPS capsule, Take by mouth., Disp: , Rfl:    HUMIRA PEN 40 MG/0.4ML PNKT, SMARTSIG:40 Milligram(s) SUB-Q Every 2 Weeks (Patient not taking: Reported on 04/14/2022), Disp: , Rfl:    Family History  Problem Relation Age  of Onset   Kidney disease Mother    Hypertension Mother    Rheum arthritis Mother    Ankylosing spondylitis Mother    Hypertension Father    Diabetes Father    Breast cancer Maternal Aunt        early 49   Crohn's disease Maternal Aunt    Lupus Maternal Aunt    Heart attack Maternal Grandmother    Stroke Maternal Grandfather      Social History   Tobacco Use   Smoking status: Former    Packs/day: 0.25    Years: 19.00    Total pack years: 4.75    Types: Cigarettes    Quit date: 09/17/2020    Years since quitting: 1.5   Smokeless tobacco: Never  Vaping Use   Vaping Use: Former  Substance Use Topics   Alcohol use: Not Currently   Drug use: No    Allergies as of 04/14/2022 - Review Complete 04/14/2022  Allergen Reaction Noted   Morphine and related Itching 12/15/2015   Sulfa antibiotics Hives 12/15/2015    Review of Systems:    All systems reviewed and negative except where noted in HPI.   Physical Exam:  BP 129/78 (BP Location: Left Arm, Patient Position: Sitting, Cuff Size: Normal)   Pulse 86   Temp 99.1 F (37.3 C) (Oral)   Ht 5' 4.5" (1.638 m)   Wt 228 lb 8 oz (103.6 kg)   BMI 38.62 kg/m  No LMP recorded. (Menstrual status: Oral contraceptives).  General:   Alert,  Well-developed, well-nourished, pleasant and cooperative in NAD Head:  Normocephalic and atraumatic. Eyes:  Sclera clear, no icterus.   Conjunctiva pink. Ears:  Normal auditory acuity. Nose:  No deformity, discharge, or lesions. Mouth:  No deformity or lesions,oropharynx pink & moist. Neck:  Supple; no masses or thyromegaly. Lungs:  Respirations even and unlabored.  Clear throughout to auscultation.   No wheezes, crackles, or rhonchi. No acute distress. Heart:  Regular rate and rhythm; no murmurs, clicks, rubs, or gallops. Abdomen:  Normal bowel sounds. Soft, non-tender and moderately distended without masses, hepatosplenomegaly or hernias noted.  No guarding or rebound tenderness.   Rectal:  Not performed Msk:  Symmetrical without gross deformities. Good, equal movement & strength bilaterally. Pulses:  Normal pulses noted. Extremities:  No clubbing or edema.  No cyanosis. Neurologic:  Alert and oriented x3;  grossly normal neurologically. Skin:  Intact without significant lesions or rashes. No jaundice. Psych:  Alert and cooperative. Normal mood and affect.  Imaging Studies: Reviewed  Assessment and Plan:   Cheryl Flowers is a 41 y.o. African-American female with history of ileocolonic Crohn's disease, stricturing phenotype diagnosed in May 2021, treated with budesonide, prednisone followed by Humira and temporarily on combination therapy with Imuran, later switched to [facility], switched back to Humira  s/p laparoscopic ileocolic resection on 01/02/1016, with primary anastomosis.  Ileocolonic Crohn's disease with stricturing phenotype: S/p ileocolic resection in 02/1024 Patient is currently not on any biologic therapy.  Patient developed worsening of arthralgia as an headaches on Humira as well as Entyvio and she does not want to go back on Entyvio or Humira. Most recent colonoscopy from November 2022 revealed inactive colitis in transverse colon and left colon, minimal activity in the ascending colon, has terminal ileal stricture, unable to traverse, chronic active ileitis Recheck labs today Recommend upper endoscopy as well as colonoscopy Discussed with patient regarding oral molecule such as Rinvoq as a third line agent to prevent postop recurrence of Crohn's disease based on upper endoscopy and colonoscopy findings  IBD Health Maintenance   1.TB status: QuantiFERON gold in 09/2021 negative 2. Anemia: None 3.Immunizations: Hep A antibody negative and B immune/reactive, Influenza recommend annual influenza vaccine, prevnar recommend, pneumovax received, Varicella unknown, Zoster recommend Shingrix vaccine 4.Cancer screening I) Colon cancer/dysplasia surveillance:  None II) Cervical cancer: Pap smear up-to-date III) Skin cancer - counseled about annual skin exam by dermatology and skin protection in summer using sun screen SPF > 50, clothing 5.Bone health Vitamin D status: Mild vitamin D deficiency, continue vitamin D supplements Bone density testing: Not performed 5. Labs: Today and every 3 months 6. Smoking: None 7. NSAIDs and Antibiotics use: None     Follow up in 3 to 4 months   Cheryl Darby, MD

## 2022-04-15 ENCOUNTER — Encounter: Payer: Self-pay | Admitting: Gastroenterology

## 2022-04-15 LAB — IRON,TIBC AND FERRITIN PANEL
Ferritin: 58 ng/mL (ref 15–150)
Iron Saturation: 37 % (ref 15–55)
Iron: 118 ug/dL (ref 27–159)
Total Iron Binding Capacity: 323 ug/dL (ref 250–450)
UIBC: 205 ug/dL (ref 131–425)

## 2022-04-15 LAB — COMPREHENSIVE METABOLIC PANEL
ALT: 23 IU/L (ref 0–32)
AST: 20 IU/L (ref 0–40)
Albumin/Globulin Ratio: 1.6 (ref 1.2–2.2)
Albumin: 4.4 g/dL (ref 3.8–4.8)
Alkaline Phosphatase: 60 IU/L (ref 44–121)
BUN/Creatinine Ratio: 12 (ref 9–23)
BUN: 10 mg/dL (ref 6–24)
Bilirubin Total: 0.4 mg/dL (ref 0.0–1.2)
CO2: 22 mmol/L (ref 20–29)
Calcium: 9.6 mg/dL (ref 8.7–10.2)
Chloride: 105 mmol/L (ref 96–106)
Creatinine, Ser: 0.86 mg/dL (ref 0.57–1.00)
Globulin, Total: 2.7 g/dL (ref 1.5–4.5)
Glucose: 92 mg/dL (ref 70–99)
Potassium: 4.2 mmol/L (ref 3.5–5.2)
Sodium: 141 mmol/L (ref 134–144)
Total Protein: 7.1 g/dL (ref 6.0–8.5)
eGFR: 88 mL/min/{1.73_m2} (ref >59)

## 2022-04-15 LAB — B12 AND FOLATE PANEL
Folate: 6.8 ng/mL (ref >3.0)
Vitamin B-12: 924 pg/mL (ref 232–1245)

## 2022-04-15 LAB — CBC
Hematocrit: 41.1 % (ref 34.0–46.6)
Hemoglobin: 13.6 g/dL (ref 11.1–15.9)
MCH: 30.9 pg (ref 26.6–33.0)
MCHC: 33.1 g/dL (ref 31.5–35.7)
MCV: 93 fL (ref 79–97)
Platelets: 354 10*3/uL (ref 150–450)
RBC: 4.4 x10E6/uL (ref 3.77–5.28)
RDW: 11.8 % (ref 11.7–15.4)
WBC: 7.4 10*3/uL (ref 3.4–10.8)

## 2022-04-15 LAB — C-REACTIVE PROTEIN: CRP: 18 mg/L — ABNORMAL HIGH (ref 0–10)

## 2022-04-16 ENCOUNTER — Other Ambulatory Visit: Payer: Self-pay | Admitting: Gastroenterology

## 2022-04-16 DIAGNOSIS — K50012 Crohn's disease of small intestine with intestinal obstruction: Secondary | ICD-10-CM | POA: Diagnosis not present

## 2022-04-22 ENCOUNTER — Ambulatory Visit: Payer: Medicaid Other | Admitting: Anesthesiology

## 2022-04-22 ENCOUNTER — Ambulatory Visit
Admission: RE | Admit: 2022-04-22 | Discharge: 2022-04-22 | Disposition: A | Discharge status: Home / Self Care | Payer: Medicaid Other | Attending: Gastroenterology | Admitting: Gastroenterology

## 2022-04-22 ENCOUNTER — Other Ambulatory Visit: Payer: Self-pay

## 2022-04-22 ENCOUNTER — Encounter: Payer: Self-pay | Admitting: Gastroenterology

## 2022-04-22 ENCOUNTER — Encounter
Admission: RE | Disposition: A | Discharge status: Home / Self Care | Payer: Self-pay | Source: Home / Self Care | Attending: Gastroenterology

## 2022-04-22 DIAGNOSIS — Z980 Intestinal bypass and anastomosis status: Secondary | ICD-10-CM | POA: Insufficient documentation

## 2022-04-22 DIAGNOSIS — K219 Gastro-esophageal reflux disease without esophagitis: Secondary | ICD-10-CM | POA: Diagnosis not present

## 2022-04-22 DIAGNOSIS — Z87891 Personal history of nicotine dependence: Secondary | ICD-10-CM | POA: Diagnosis not present

## 2022-04-22 DIAGNOSIS — J45909 Unspecified asthma, uncomplicated: Secondary | ICD-10-CM | POA: Diagnosis not present

## 2022-04-22 DIAGNOSIS — R1013 Epigastric pain: Secondary | ICD-10-CM

## 2022-04-22 DIAGNOSIS — R519 Headache, unspecified: Secondary | ICD-10-CM | POA: Insufficient documentation

## 2022-04-22 DIAGNOSIS — K50819 Crohn's disease of both small and large intestine with unspecified complications: Secondary | ICD-10-CM | POA: Diagnosis not present

## 2022-04-22 DIAGNOSIS — K50012 Crohn's disease of small intestine with intestinal obstruction: Secondary | ICD-10-CM | POA: Diagnosis not present

## 2022-04-22 DIAGNOSIS — I10 Essential (primary) hypertension: Secondary | ICD-10-CM | POA: Diagnosis not present

## 2022-04-22 DIAGNOSIS — K5080 Crohn's disease of both small and large intestine without complications: Secondary | ICD-10-CM | POA: Diagnosis not present

## 2022-04-22 HISTORY — DX: Presence of dental prosthetic device (complete) (partial): Z97.2

## 2022-04-22 HISTORY — DX: Gastro-esophageal reflux disease without esophagitis: K21.9

## 2022-04-22 HISTORY — DX: Pain in unspecified joint: M25.50

## 2022-04-22 HISTORY — DX: Unspecified asthma, uncomplicated: J45.909

## 2022-04-22 HISTORY — PX: COLONOSCOPY WITH PROPOFOL: SHX5780

## 2022-04-22 HISTORY — DX: Migraine, unspecified, not intractable, without status migrainosus: G43.909

## 2022-04-22 HISTORY — PX: ESOPHAGOGASTRODUODENOSCOPY (EGD) WITH PROPOFOL: SHX5813

## 2022-04-22 LAB — POCT PREGNANCY, URINE: Preg Test, Ur: NEGATIVE

## 2022-04-22 SURGERY — COLONOSCOPY WITH PROPOFOL
Anesthesia: General

## 2022-04-22 MED ORDER — ACETAMINOPHEN 160 MG/5ML PO SOLN: 975.0000 mg | Freq: Once | ORAL | Status: DC | PRN

## 2022-04-22 MED ORDER — ACETAMINOPHEN 500 MG PO TABS: 1000.0000 mg | ORAL_TABLET | Freq: Once | ORAL | Status: DC | PRN

## 2022-04-22 MED ORDER — ONDANSETRON HCL 4 MG/2ML IJ SOLN: 4.0000 mg | Freq: Once | INTRAMUSCULAR | Status: DC | PRN

## 2022-04-22 MED ORDER — LIDOCAINE HCL (CARDIAC) PF 100 MG/5ML IV SOSY
PREFILLED_SYRINGE | INTRAVENOUS | Status: DC | PRN
  Administered 2022-04-22: 30 mg via INTRAVENOUS

## 2022-04-22 MED ORDER — PROPOFOL 10 MG/ML IV BOLUS
INTRAVENOUS | Status: DC | PRN
  Administered 2022-04-22 (×2): 40 mg via INTRAVENOUS
  Administered 2022-04-22: 20 mg via INTRAVENOUS
  Administered 2022-04-22: 40 mg via INTRAVENOUS
  Administered 2022-04-22: 150 mg via INTRAVENOUS
  Administered 2022-04-22: 50 mg via INTRAVENOUS
  Administered 2022-04-22: 40 mg via INTRAVENOUS

## 2022-04-22 MED ORDER — LACTATED RINGERS IV SOLN: INTRAVENOUS | Status: DC

## 2022-04-22 MED ORDER — STERILE WATER FOR IRRIGATION IR SOLN
Status: DC | PRN
  Administered 2022-04-22: 1000 mL

## 2022-04-22 MED ORDER — GLYCOPYRROLATE 0.2 MG/ML IJ SOLN
INTRAMUSCULAR | Status: DC | PRN
  Administered 2022-04-22: .1 mg via INTRAVENOUS

## 2022-04-22 MED ORDER — SODIUM CHLORIDE 0.9 % IV SOLN: INTRAVENOUS | Status: DC

## 2022-04-22 SURGICAL SUPPLY — 38 items
BALLN DILATOR 10-12 8 (BALLOONS)
BALLN DILATOR 12-15 8 (BALLOONS)
BALLN DILATOR 15-18 8 (BALLOONS)
BALLN DILATOR CRE 0-12 8 (BALLOONS)
BALLN DILATOR ESOPH 8 10 CRE (MISCELLANEOUS) IMPLANT
BALLOON DILATOR 12-15 8 (BALLOONS) IMPLANT
BALLOON DILATOR 15-18 8 (BALLOONS) IMPLANT
BALLOON DILATOR CRE 0-12 8 (BALLOONS) IMPLANT
BLOCK BITE 60FR ADLT L/F GRN (MISCELLANEOUS) ×2 IMPLANT
CLIP HMST 235XBRD CATH ROT (MISCELLANEOUS) IMPLANT
CLIP RESOLUTION 360 11X235 (MISCELLANEOUS)
ELECT REM PT RETURN 9FT ADLT (ELECTROSURGICAL)
ELECTRODE REM PT RTRN 9FT ADLT (ELECTROSURGICAL) IMPLANT
FCP ESCP3.2XJMB 240X2.8X (MISCELLANEOUS)
FORCEPS BIOP RAD 4 LRG CAP 4 (CUTTING FORCEPS) IMPLANT
FORCEPS BIOP RJ4 240 W/NDL (MISCELLANEOUS)
FORCEPS ESCP3.2XJMB 240X2.8X (MISCELLANEOUS) IMPLANT
GOWN CVR UNV OPN BCK APRN NK (MISCELLANEOUS) ×2 IMPLANT
GOWN ISOL THUMB LOOP REG UNIV (MISCELLANEOUS) ×4
INJECTOR VARIJECT VIN23 (MISCELLANEOUS) IMPLANT
KIT DEFENDO VALVE AND CONN (KITS) IMPLANT
KIT PRC NS LF DISP ENDO (KITS) ×1 IMPLANT
KIT PROCEDURE OLYMPUS (KITS) ×2
MANIFOLD NEPTUNE II (INSTRUMENTS) ×2 IMPLANT
MARKER SPOT ENDO TATTOO 5ML (MISCELLANEOUS) IMPLANT
PROBE APC STR FIRE (PROBE) IMPLANT
RETRIEVER NET PLAT FOOD (MISCELLANEOUS) IMPLANT
RETRIEVER NET ROTH 2.5X230 LF (MISCELLANEOUS) IMPLANT
SNARE COLD EXACTO (MISCELLANEOUS) IMPLANT
SNARE SHORT THROW 13M SML OVAL (MISCELLANEOUS) IMPLANT
SNARE SHORT THROW 30M LRG OVAL (MISCELLANEOUS) IMPLANT
SNARE SNG USE RND 15MM (INSTRUMENTS) IMPLANT
SPOT EX ENDOSCOPIC TATTOO (MISCELLANEOUS)
SYR INFLATION 60ML (SYRINGE) IMPLANT
TRAP ETRAP POLY (MISCELLANEOUS) IMPLANT
VARIJECT INJECTOR VIN23 (MISCELLANEOUS)
WATER STERILE IRR 250ML POUR (IV SOLUTION) ×2 IMPLANT
WIRE CRE 18-20MM 8CM F G (MISCELLANEOUS) IMPLANT

## 2022-04-22 NOTE — Op Note (Signed)
[facility] Patient Name: Cheryl Flowers Procedure Date: 04/22/2022 9:15 AM MRN: [id number] Account #: [id number] Date of Birth: 07-May-1981 Admit Type: Outpatient Age: 41 Room: [facility] OR ROOM 01 Gender: Female Note Status: Finalized Instrument Name: [id number] Procedure:             Upper GI endoscopy Indications:           Crohn's disease Providers:             Toney Reil MD, MD Referring MD:          Toney Reil MD, MD (Referring MD), Dorcas Carrow (Referring MD) Medicines:             General Anesthesia Complications:         No immediate complications. Estimated blood loss: None. Procedure:             Pre-Anesthesia Assessment:                        - Prior to the procedure, a History and Physical was                         performed, and patient medications and allergies were                         reviewed. The patient is competent. The risks and                         benefits of the procedure and the sedation options and                         risks were discussed with the patient. All questions                         were answered and informed consent was obtained.                         Patient identification and proposed procedure were                         verified by the physician, the nurse, the                         anesthesiologist, the anesthetist and the technician                         in the pre-procedure area in the procedure room in the                         endoscopy suite. Mental Status Examination: alert and                         oriented. Airway Examination: normal oropharyngeal                         airway and neck mobility. Respiratory Examination:  clear to auscultation. CV Examination: normal.                         Prophylactic Antibiotics: The patient does not require                         prophylactic antibiotics. Prior  Anticoagulants: The                         patient has taken no previous anticoagulant or                         antiplatelet agents. ASA Grade Assessment: II - A                         patient with mild systemic disease. After reviewing                         the risks and benefits, the patient was deemed in                         satisfactory condition to undergo the procedure. The                         anesthesia plan was to use general anesthesia.                         Immediately prior to administration of medications,                         the patient was re-assessed for adequacy to receive                         sedatives. The heart rate, respiratory rate, oxygen                         saturations, blood pressure, adequacy of pulmonary                         ventilation, and response to care were monitored                         throughout the procedure. The physical status of the                         patient was re-assessed after the procedure.                        After obtaining informed consent, the endoscope was                         passed under direct vision. Throughout the procedure,                         the patient's blood pressure, pulse, and oxygen                         saturations were monitored continuously. The Endoscope  was introduced through the mouth, and advanced to the                         second part of duodenum. The upper GI endoscopy was                         accomplished without difficulty. The patient tolerated                         the procedure well. Findings:      The esophagus was normal.      The stomach was normal.      The examined duodenum was normal. Impression:            - Normal esophagus.                        - Normal stomach.                        - Normal examined duodenum.                        - No specimens collected. Recommendation:        - Proceed with colonoscopy as  scheduled                        See colonoscopy report Procedure Code(s):     --- Professional ---                        [phone], Esophagogastroduodenoscopy, flexible,                         transoral; diagnostic, including collection of                         specimen(s) by brushing or washing, when performed                         (separate procedure) Diagnosis Code(s):     --- Professional ---                        K50.90, Crohn's disease, unspecified, without                         complications CPT copyright 2019 American Medical Association. All rights reserved. The codes documented in this report are preliminary and upon coder review may  be revised to meet current compliance requirements. Dr. Libby Maw Toney Reil MD, MD 04/22/2022 9:32:23 AM This report has been signed electronically. Number of Addenda: 0 Note Initiated On: 04/22/2022 9:15 AM Total Procedure Duration: 0 hours 3 minutes 22 seconds  Estimated Blood Loss:  Estimated blood loss: none.      [facility]

## 2022-04-22 NOTE — Anesthesia Postprocedure Evaluation (Signed)
Anesthesia Post Note  Patient: Cheryl Flowers  Procedure(s) Performed: COLONOSCOPY WITH PROPOFOL ESOPHAGOGASTRODUODENOSCOPY (EGD) WITH PROPOFOL     Patient location during evaluation: PACU Anesthesia Type: General Level of consciousness: awake and alert Pain management: pain level controlled Vital Signs Assessment: post-procedure vital signs reviewed and stable Respiratory status: spontaneous breathing, nonlabored ventilation and respiratory function stable Cardiovascular status: blood pressure returned to baseline and stable Postop Assessment: no apparent nausea or vomiting Anesthetic complications: no   No notable events documented.  Loni Beckwith

## 2022-04-22 NOTE — Op Note (Signed)
[facility] Patient Name: Cheryl Flowers Procedure Date: 04/22/2022 9:14 AM MRN: [id number] Account #: [id number] Date of Birth: 1981/02/21 Admit Type: Outpatient Age: 41 Room: [facility] OR ROOM 01 Gender: Female Note Status: Finalized Instrument Name: [id number] Procedure:             Colonoscopy Indications:           Disease activity assessment of Crohn's disease of the                         small bowel and colon Providers:             Toney Reil MD, MD Referring MD:          Toney Reil MD, MD (Referring MD), Dorcas Carrow (Referring MD) Medicines:             General Anesthesia Complications:         No immediate complications. Estimated blood loss: None. Procedure:             Pre-Anesthesia Assessment:                        - Prior to the procedure, a History and Physical was                         performed, and patient medications and allergies were                         reviewed. The patient is competent. The risks and                         benefits of the procedure and the sedation options and                         risks were discussed with the patient. All questions                         were answered and informed consent was obtained.                         Patient identification and proposed procedure were                         verified by the physician, the nurse, the                         anesthesiologist, the anesthetist and the technician                         in the pre-procedure area in the procedure room in the                         endoscopy suite. Mental Status Examination: alert and                         oriented. Airway Examination: normal oropharyngeal  airway and neck mobility. Respiratory Examination:                         clear to auscultation. CV Examination: normal.                         Prophylactic Antibiotics: The patient  does not require                         prophylactic antibiotics. Prior Anticoagulants: The                         patient has taken no previous anticoagulant or                         antiplatelet agents. ASA Grade Assessment: II - A                         patient with mild systemic disease. After reviewing                         the risks and benefits, the patient was deemed in                         satisfactory condition to undergo the procedure. The                         anesthesia plan was to use general anesthesia.                         Immediately prior to administration of medications,                         the patient was re-assessed for adequacy to receive                         sedatives. The heart rate, respiratory rate, oxygen                         saturations, blood pressure, adequacy of pulmonary                         ventilation, and response to care were monitored                         throughout the procedure. The physical status of the                         patient was re-assessed after the procedure.                        After obtaining informed consent, the colonoscope was                         passed under direct vision. Throughout the procedure,                         the patient's blood pressure, pulse, and oxygen  saturations were monitored continuously. The                         Colonoscope was introduced through the anus and                         advanced to the the ileocolonic anastomosis. The                         colonoscopy was performed without difficulty. The                         patient tolerated the procedure well. The quality of                         the bowel preparation was good. Findings:      The perianal and digital rectal examinations were normal. Pertinent       negatives include normal sphincter tone and no palpable rectal lesions.      There was evidence of a prior end-to-side  ileo-colonic anastomosis in       the ascending colon. This was patent and was characterized by healthy       appearing mucosa. The anastomosis was traversed.      The entire examined colon appeared normal.      The retroflexed view of the distal rectum and anal verge was normal and       showed no anal or rectal abnormalities.      The neo-terminal ileum appeared normal. Impression:            - Patent end-to-side ileo-colonic anastomosis,                         characterized by healthy appearing mucosa.                        - The entire examined colon is normal.                        - The distal rectum and anal verge are normal on                         retroflexion view.                        - The examined portion of the ileum was normal.                        - No specimens collected. Recommendation:        - Discharge patient to home (with escort).                        - Resume previous diet today.                        - Continue present medications.                        - Return to my office as previously scheduled. Procedure Code(s):     --- Professional ---  40981, Colonoscopy, flexible; diagnostic, including                         collection of specimen(s) by brushing or washing, when                         performed (separate procedure) Diagnosis Code(s):     --- Professional ---                        Z98.0, Intestinal bypass and anastomosis status                        K50.80, Crohn's disease of both small and large                         intestine without complications CPT copyright 2019 American Medical Association. All rights reserved. The codes documented in this report are preliminary and upon coder review may  be revised to meet current compliance requirements. Dr. Libby Maw Toney Reil MD, MD 04/22/2022 9:49:27 AM This report has been signed electronically. Number of Addenda: 0 Note Initiated On: 04/22/2022 9:14  AM Scope Withdrawal Time: 0 hours 6 minutes 19 seconds  Total Procedure Duration: 0 hours 9 minutes 13 seconds  Estimated Blood Loss:  Estimated blood loss: none. Estimated blood loss: none.      [facility]

## 2022-04-22 NOTE — H&P (Signed)
Arlyss Repress, MD [address]  [address]  [place], [place] [address]  Main: [phone]  Fax: [phone] Pager: [phone]  Primary Care Physician:  Dorcas Carrow, DO Primary Gastroenterologist:  Dr. Arlyss Repress  Pre-Procedure History & Physical: HPI:  Cheryl Flowers is a 41 y.o. female is here for an endoscopy and colonoscopy.   Past Medical History:  Diagnosis Date   Anxiety    Asthma    Crohn's disease (HCC)    Depression    GERD (gastroesophageal reflux disease)    Joint pain    secondary to Crohn's   Low back pain    Marital conflict    Migraine headache    approx 2x/month   Panic disorder    Thyromegaly    Tobacco abuse 05/12/2018   Wears dentures    partial upper    Past Surgical History:  Procedure Laterality Date   CESAREAN SECTION  2003 and 2010   CHOLECYSTECTOMY  2007   COLONOSCOPY WITH PROPOFOL N/A 03/07/2020   Procedure: COLONOSCOPY WITH PROPOFOL;  Surgeon: Pasty Spillers, MD;  Location: [facility] ENDOSCOPY;  Service: Endoscopy;  Laterality: N/A;   COLONOSCOPY WITH PROPOFOL N/A 07/27/2020   Procedure: COLONOSCOPY WITH PROPOFOL;  Surgeon: Pasty Spillers, MD;  Location: [facility] ENDOSCOPY;  Service: Endoscopy;  Laterality: N/A;   COLONOSCOPY WITH PROPOFOL N/A 03/27/2021   Procedure: COLONOSCOPY WITH PROPOFOL;  Surgeon: Pasty Spillers, MD;  Location: [facility] ENDOSCOPY;  Service: Endoscopy;  Laterality: N/A;   COLONOSCOPY WITH PROPOFOL N/A 08/28/2021   Procedure: COLONOSCOPY WITH PROPOFOL;  Surgeon: Pasty Spillers, MD;  Location: [facility] ENDOSCOPY;  Service: Endoscopy;  Laterality: N/A;   TUBAL LIGATION  2010    Prior to Admission medications   Medication Sig Start Date End Date Taking? Authorizing Provider  acetaminophen (TYLENOL) 500 MG tablet Take 1-2 tablets (500 mg - 1 g total) by mouth every 6 hours as needed for pain. Prescription not provided, medication is available over the counter. Take as directed on label 01/30/22   Yes [provider]  albuterol (VENTOLIN HFA) 108 (90 Base) MCG/ACT inhaler Inhale 2 puffs into the lungs every 6 (six) hours as needed for wheezing or shortness of breath. 03/26/21  Yes Glenford Bayley, NP  budesonide-formoterol [facility]) 160-4.5 MCG/ACT inhaler Inhale into the lungs. 01/22/22 01/22/23 Yes [provider]  norethindrone (AYGESTIN) 5 MG tablet Take 1 tablet by mouth daily. 06/04/20 04/22/22 Yes [provider]  pantoprazole (PROTONIX) 40 MG tablet Take 40 mg by mouth. 01/22/22  Yes [provider]  Vitamin D, Ergocalciferol, (DRISDOL) 1.25 MG (50000 UNIT) CAPS capsule Take by mouth. 12/19/21  Yes [provider]  HUMIRA PEN 40 MG/0.4ML PNKT SMARTSIG:40 Milligram(s) SUB-Q Every 2 Weeks Patient not taking: Reported on 04/14/2022 12/16/21   [provider]    Allergies as of 04/14/2022 - Review Complete 04/14/2022  Allergen Reaction Noted   Morphine and related Itching 12/15/2015   Sulfa antibiotics Hives 12/15/2015    Family History  Problem Relation Age of Onset   Kidney disease Mother    Hypertension Mother    Rheum arthritis Mother    Ankylosing spondylitis Mother    Hypertension Father    Diabetes Father    Breast cancer Maternal Aunt        early 37   Crohn's disease Maternal Aunt    Lupus Maternal Aunt    Heart attack Maternal Grandmother    Stroke Maternal Grandfather  Social History   Socioeconomic History   Marital status: Married    Spouse name: Not on file   Number of children: Not on file   Years of education: Not on file   Highest education level: Associate degree: occupational, Scientist, product/process development, or vocational program  Occupational History   Not on file  Tobacco Use   Smoking status: Former    Packs/day: 0.25    Years: 19.00    Total pack years: 4.75    Types: Cigarettes    Quit date: 01/25/2022    Years since quitting: 0.2   Smokeless tobacco: Never  Vaping Use   Vaping Use: Former  Substance  and Sexual Activity   Alcohol use: Not Currently   Drug use: No   Sexual activity: Yes    Birth control/protection: Surgical  Other Topics Concern   Not on file  Social History Narrative   Not on file   Social Determinants of Health   Financial Resource Strain: Low Risk  (06/07/2021)   Overall Financial Resource Strain (CARDIA)    Difficulty of Paying Living Expenses: Not very hard  Food Insecurity: No Food Insecurity (06/07/2021)   Hunger Vital Sign    Worried About Running Out of Food in the Last Year: Never true    Ran Out of Food in the Last Year: Never true  Transportation Needs: No Transportation Needs (06/07/2021)   PRAPARE - Administrator, Civil Service (Medical): No    Lack of Transportation (Non-Medical): No  Physical Activity: Insufficiently Active (06/07/2021)   Exercise Vital Sign    Days of Exercise per Week: 1 day    Minutes of Exercise per Session: 10 min  Stress: Stress Concern Present (06/07/2021)   [employer] of Occupational Health - Occupational Stress Questionnaire    Feeling of Stress : Rather much  Social Connections: Moderately Integrated (06/07/2021)   Social Connection and Isolation Panel [NHANES]    Frequency of Communication with Friends and Family: Twice a week    Frequency of Social Gatherings with Friends and Family: Once a week    Attends Religious Services: More than 4 times per year    Active Member of [employer] or Organizations: No    Attends Engineer, structural: Not on file    Marital Status: Married  Catering manager Violence: Not on file    Review of Systems: See HPI, otherwise negative ROS  Physical Exam: BP 118/70   Pulse 77   Temp 98.6 F (37 C) (Temporal)   Ht 5' 4.5" (1.638 m)   Wt 103.7 kg   LMP  (LMP Unknown) Comment: preg test neg  SpO2 98%   BMI 38.63 kg/m  General:   Alert,  pleasant and cooperative in NAD Head:  Normocephalic and atraumatic. Neck:  Supple; no masses or thyromegaly. Lungs:   Clear throughout to auscultation.    Heart:  Regular rate and rhythm. Abdomen:  Soft, nontender and nondistended. Normal bowel sounds, without guarding, and without rebound.   Neurologic:  Alert and  oriented x4;  grossly normal neurologically.  Impression/Plan: Shireen Quan Schmall is here for an endoscopy and colonoscopy to be performed for h/o ileocolonic crohn's  Risks, benefits, limitations, and alternatives regarding  endoscopy and colonoscopy have been reviewed with the patient.  Questions have been answered.  All parties agreeable.   Lannette Donath, MD  04/22/2022, 9:08 AM

## 2022-04-22 NOTE — Anesthesia Preprocedure Evaluation (Signed)
Anesthesia Evaluation  Patient identified by MRN, date of birth, ID band Patient awake    Reviewed: Allergy & Precautions, H&P , NPO status , Patient's Chart, lab work & pertinent test results, reviewed documented beta blocker date and time   Airway Mallampati: II  TM Distance: >3 FB Neck ROM: full    Dental no notable dental hx.    Pulmonary neg pulmonary ROS, asthma , former smoker,    Pulmonary exam normal breath sounds clear to auscultation       Cardiovascular Exercise Tolerance: Good hypertension, negative cardio ROS   Rhythm:regular Rate:Normal     Neuro/Psych  Headaches, PSYCHIATRIC DISORDERS (panic disorder) Anxiety negative neurological ROS  negative psych ROS   GI/Hepatic negative GI ROS, Neg liver ROS, GERD  ,Crohn's   Endo/Other  negative endocrine ROS  Renal/GU negative Renal ROS  negative genitourinary   Musculoskeletal   Abdominal   Peds  Hematology negative hematology ROS (+)   Anesthesia Other Findings   Reproductive/Obstetrics negative OB ROS                            Anesthesia Physical Anesthesia Plan  ASA: 2  Anesthesia Plan: General   Post-op Pain Management:    Induction:   PONV Risk Score and Plan: 3 and Propofol infusion, TIVA and Treatment may vary due to age or medical condition  Airway Management Planned:   Additional Equipment:   Intra-op Plan:   Post-operative Plan:   Informed Consent: I have reviewed the patients History and Physical, chart, labs and discussed the procedure including the risks, benefits and alternatives for the proposed anesthesia with the patient or authorized representative who has indicated his/her understanding and acceptance.     Dental Advisory Given  Plan Discussed with: CRNA  Anesthesia Plan Comments:         Anesthesia Quick Evaluation

## 2022-04-22 NOTE — Transfer of Care (Signed)
Immediate Anesthesia Transfer of Care Note  Patient: Cheryl Flowers  Procedure(s) Performed: COLONOSCOPY WITH PROPOFOL ESOPHAGOGASTRODUODENOSCOPY (EGD) WITH PROPOFOL  Patient Location: PACU  Anesthesia Type: General  Level of Consciousness: awake, alert  and patient cooperative  Airway and Oxygen Therapy: Patient Spontanous Breathing and Patient connected to supplemental oxygen  Post-op Assessment: Post-op Vital signs reviewed, Patient's Cardiovascular Status Stable, Respiratory Function Stable, Patent Airway and No signs of Nausea or vomiting  Post-op Vital Signs: Reviewed and stable  Complications: No notable events documented.

## 2022-04-23 ENCOUNTER — Telehealth: Payer: Self-pay

## 2022-04-23 ENCOUNTER — Encounter: Payer: Self-pay | Admitting: Gastroenterology

## 2022-04-23 LAB — CALPROTECTIN, FECAL: Calprotectin, Fecal: 201 ug/g — ABNORMAL HIGH (ref 0–120)

## 2022-04-23 NOTE — Telephone Encounter (Signed)
Filled out paperwork to [facility] and faxed it to them. Emailed rick to inform him

## 2022-04-23 NOTE — Telephone Encounter (Signed)
Which one do you want to apply for because we have not applied for either. She is taking Humira right now

## 2022-04-23 NOTE — Telephone Encounter (Signed)
-----   Message from Lin Landsman, MD sent at 04/23/2022  9:17 AM EDT ----- Cristopher Estimable you have any update on Rinvoq for her? not sure if we applied.  If Rinvoq is not approved by her insurance, let's try [employer]  RV

## 2022-05-01 ENCOUNTER — Telehealth: Payer: Self-pay

## 2022-05-01 NOTE — Telephone Encounter (Signed)
[employer] Denied Rinvoq 13m. Can you please write letter for appeal why patient needs medication. And we need to fax to [phone]

## 2022-05-05 ENCOUNTER — Telehealth: Payer: Self-pay | Admitting: Family Medicine

## 2022-05-05 ENCOUNTER — Encounter: Payer: Self-pay | Admitting: Gastroenterology

## 2022-05-05 NOTE — Telephone Encounter (Signed)
Letter is done - RV

## 2022-05-05 NOTE — Telephone Encounter (Signed)
..   Medicaid Managed Care   Unsuccessful Outreach Note  05/05/2022 Name: Cheryl Flowers MRN: [id number] DOB: Mar 19, 1981  Referred by: [name], DO Reason for referral : High Risk Managed Medicaid (I called the patient today to get her scheduled with the MM Team. I left my name and number on her VM.)   An unsuccessful telephone outreach was attempted today. The patient was referred to the case management team for assistance with care management and care coordination.   Follow Up Plan: The care management team will reach out to the patient again over the next 7-14 days.    [facility]eteran

## 2022-05-06 NOTE — Telephone Encounter (Signed)
Faxed appeal letter and last office visit, labs MR, colonoscopy and EGD report to them

## 2022-05-06 NOTE — Telephone Encounter (Signed)
[employer] called and said medication was approved and they will fax [place] the confirmation of the approval

## 2022-05-19 NOTE — Telephone Encounter (Signed)
Liliane Channel states that [employer] has not approved the Astron[facility]. Refaxed appeal letter to them

## 2022-05-26 ENCOUNTER — Encounter: Payer: Self-pay | Admitting: Gastroenterology

## 2022-05-27 ENCOUNTER — Telehealth: Payer: Self-pay

## 2022-05-27 NOTE — Telephone Encounter (Signed)
Per Liliane Channel with [employer] [facility]  We are getting a message, "plan limitation exceeded" when running her claim.  We have seen this before.  We will call insurance to try to clear this.

## 2022-05-27 NOTE — Telephone Encounter (Signed)
Called [employer] this morning and they state that insurance has approved the Rinvoq now. She said the case number is [id number] she states that she has to use a specialty pharmacy and there speciality pharmacy is [facility]. There number is [phone]. They said other speciality pharmacy are in network.  Called Cheryl Flowers with [employer] and gave him the information. She states they have not got a message back that they were not in network but she would get them to run the medication on her insurance today and find out if they can fill the medication if not they will tell us where to send it Called to inform patient and left a message for call back and sent mychart message

## 2022-05-29 ENCOUNTER — Ambulatory Visit: Payer: Medicaid Other | Admitting: Dermatology

## 2022-06-03 IMAGING — CR DG CHEST 2V
1 series · 2 of 2 positions shown · non-contrast
Comparison: 03/15/2021

CLINICAL DATA: Cough for 3 months.  Chest pain.

EXAM:
CHEST - 2 VIEW

[Series 1: dg chest 2 view · 0.14mm/px · 2 of 2 slices shown]
[im 1/2]
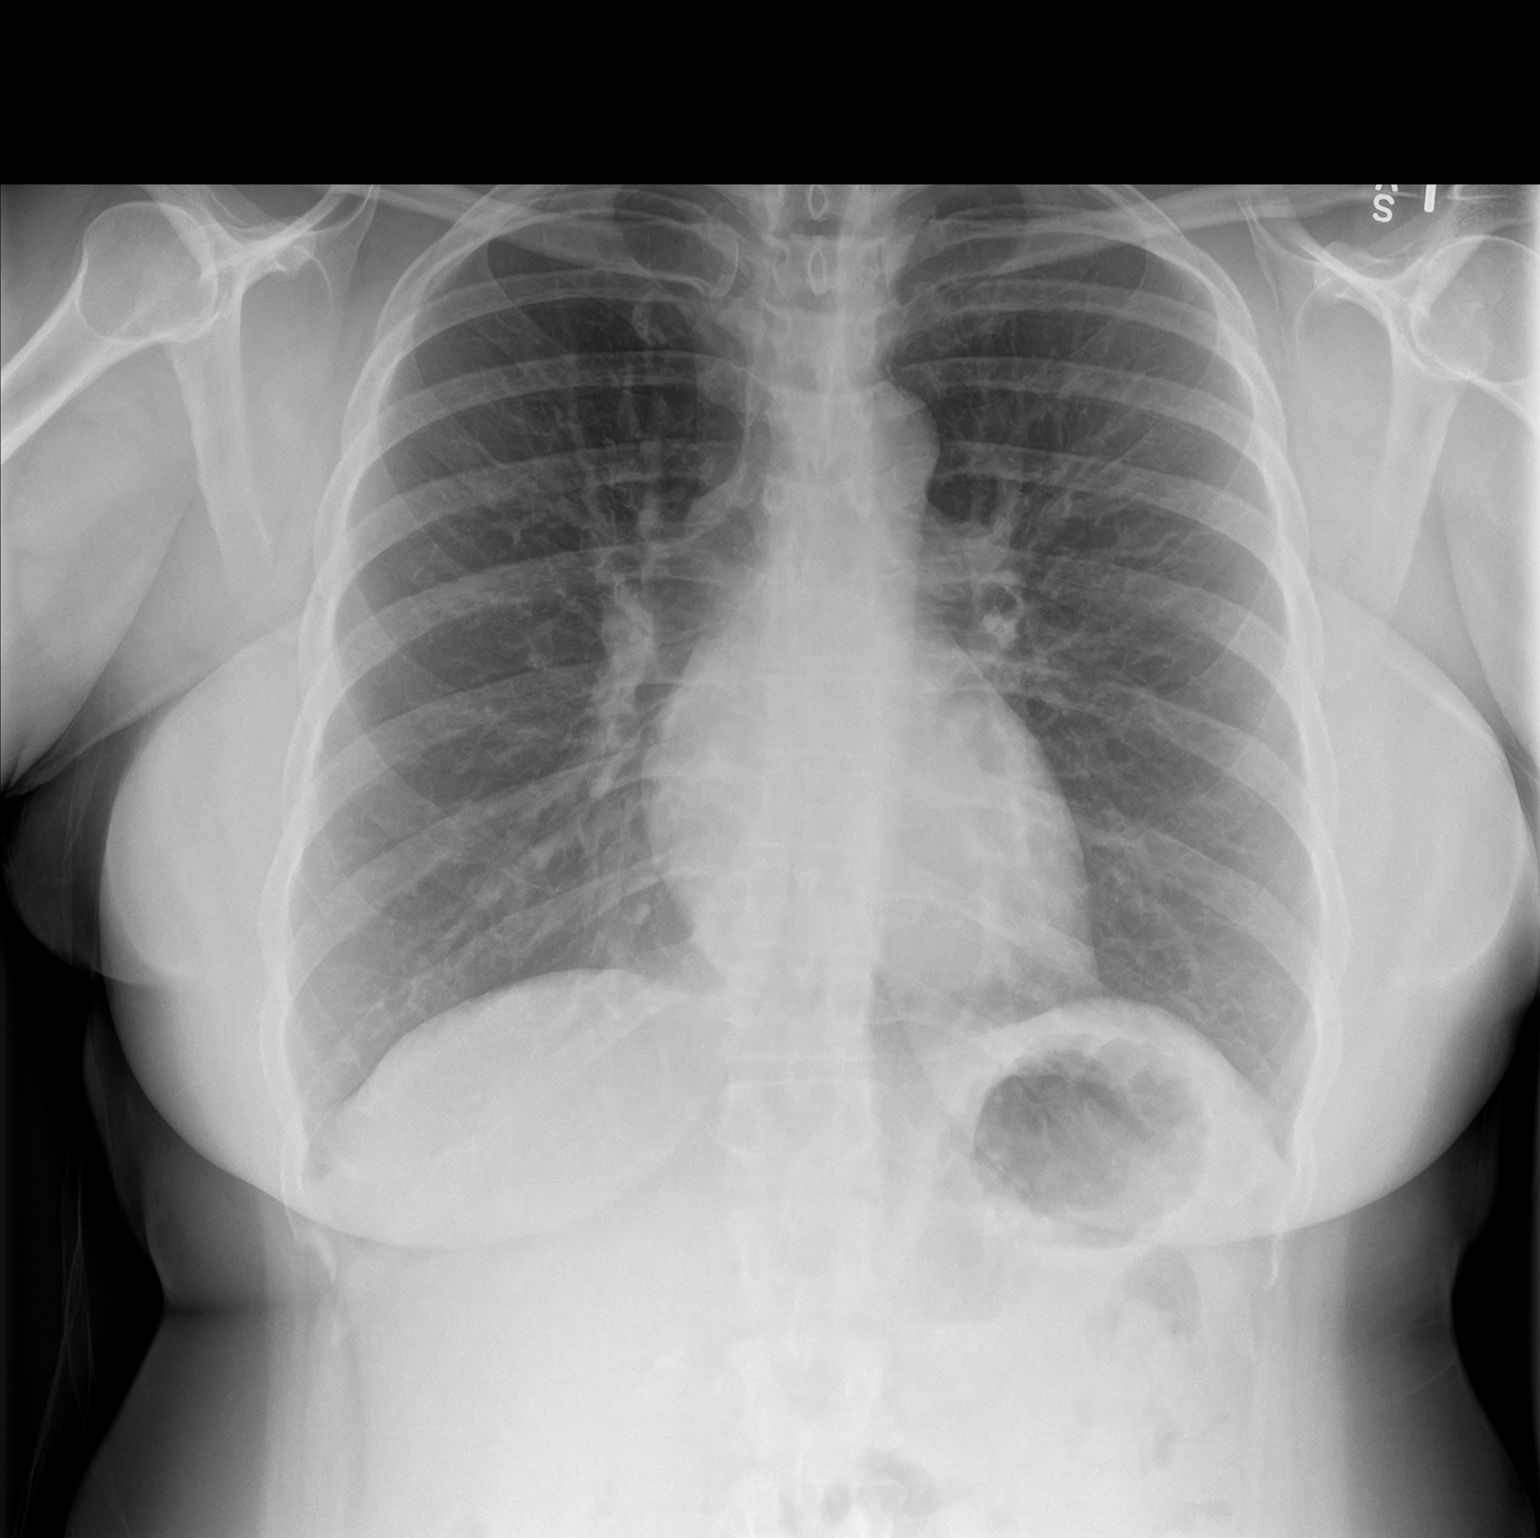
[im 2/2]
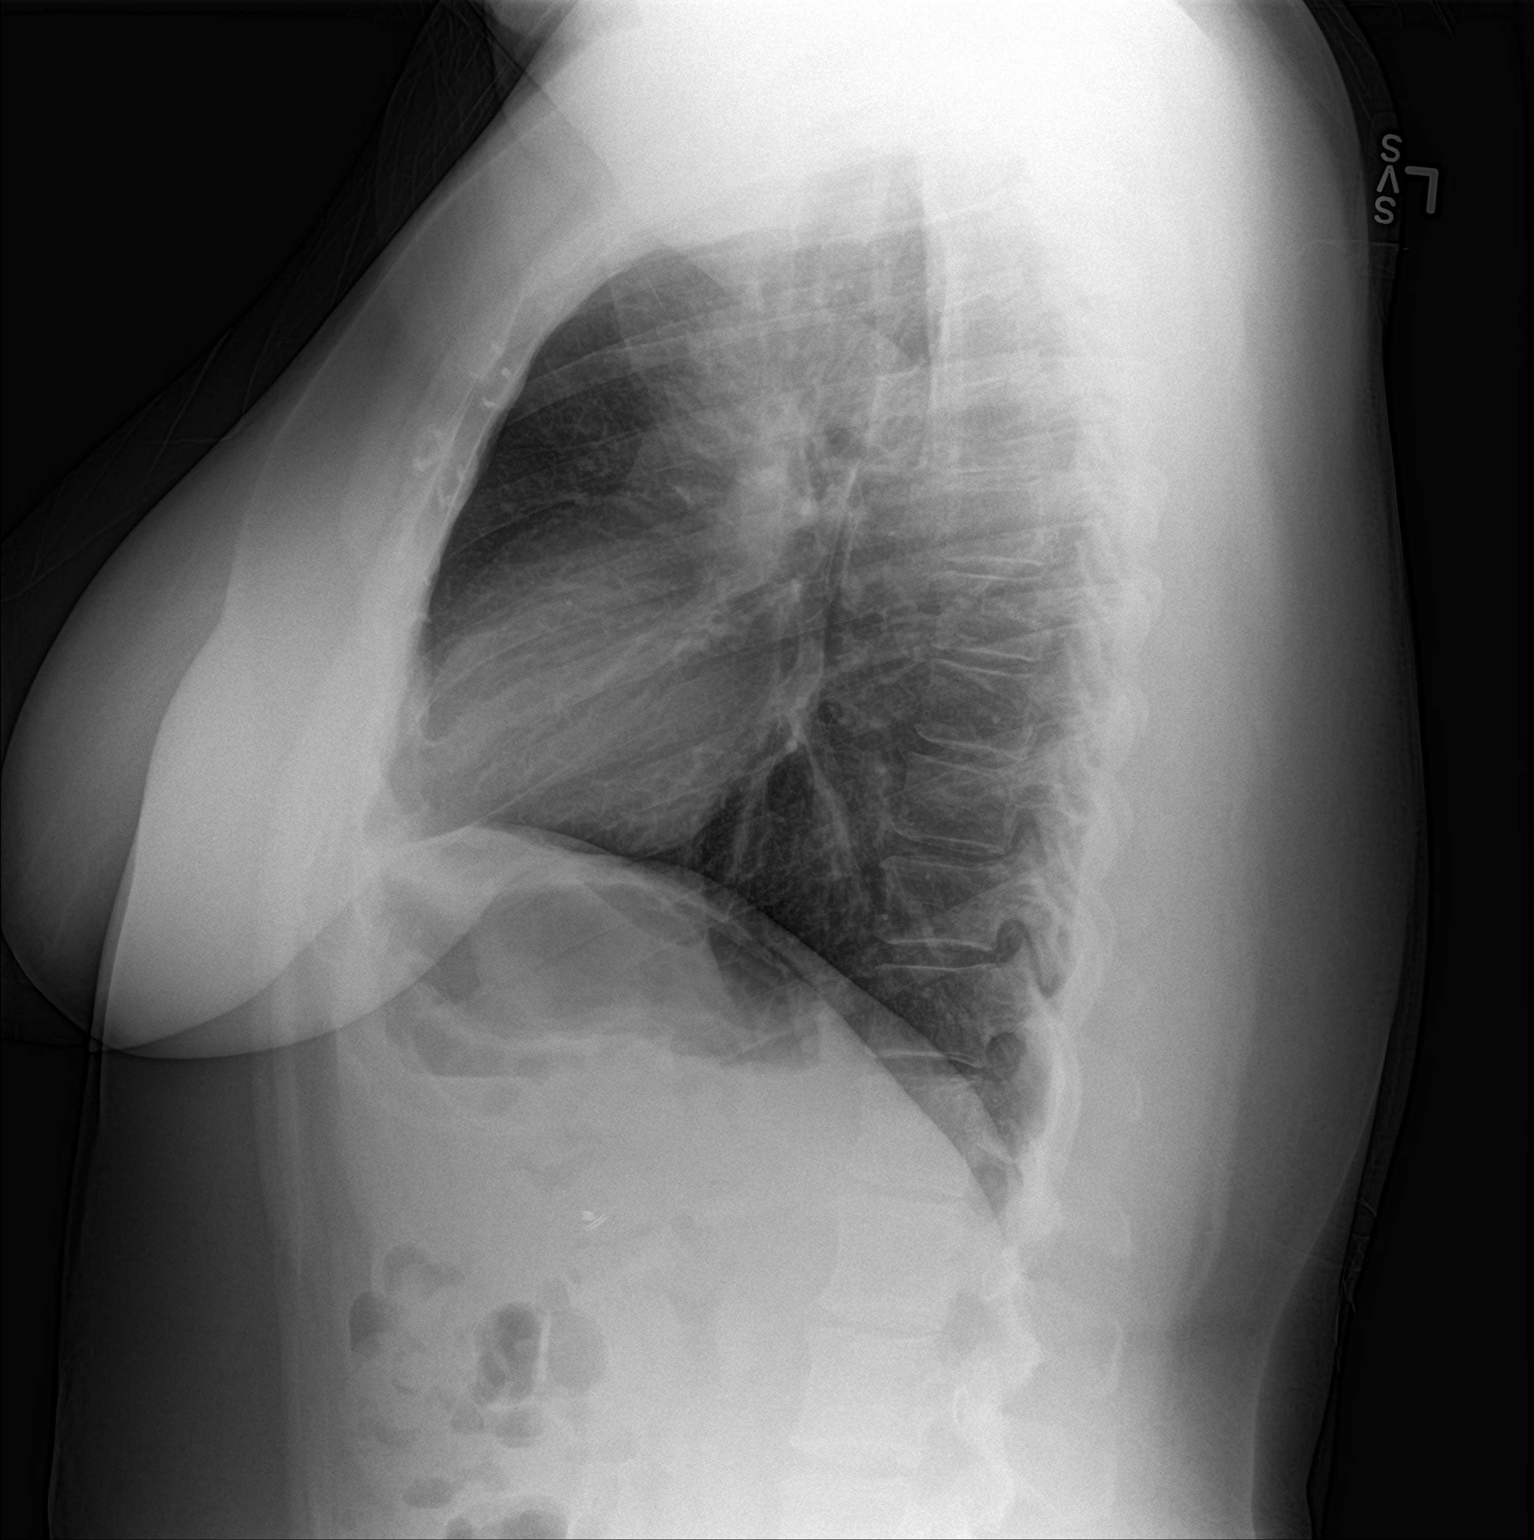

[2 of 2 positions shown; findings below may reference images not displayed]

FINDINGS: The heart size and mediastinal contours are within normal limits.
Both lungs are clear. The visualized skeletal structures are
unremarkable.
IMPRESSION: No active cardiopulmonary disease.

## 2022-06-03 NOTE — Telephone Encounter (Signed)
Per Liliane Channel with [employer] [facility] Patient medication is shipped and set to arrive tomorrow to her house

## 2022-06-05 ENCOUNTER — Ambulatory Visit: Payer: Self-pay

## 2022-06-13 DIAGNOSIS — M533 Sacrococcygeal disorders, not elsewhere classified: Secondary | ICD-10-CM | POA: Diagnosis not present

## 2022-06-13 DIAGNOSIS — M47816 Spondylosis without myelopathy or radiculopathy, lumbar region: Secondary | ICD-10-CM | POA: Diagnosis not present

## 2022-06-13 DIAGNOSIS — M5136 Other intervertebral disc degeneration, lumbar region: Secondary | ICD-10-CM | POA: Diagnosis not present

## 2022-06-13 DIAGNOSIS — M545 Low back pain, unspecified: Secondary | ICD-10-CM | POA: Diagnosis not present

## 2022-06-13 DIAGNOSIS — R1031 Right lower quadrant pain: Secondary | ICD-10-CM | POA: Diagnosis not present

## 2022-06-13 DIAGNOSIS — M25551 Pain in right hip: Secondary | ICD-10-CM | POA: Diagnosis not present

## 2022-06-13 DIAGNOSIS — R7982 Elevated C-reactive protein (CRP): Secondary | ICD-10-CM | POA: Diagnosis not present

## 2022-06-13 DIAGNOSIS — R768 Other specified abnormal immunological findings in serum: Secondary | ICD-10-CM | POA: Diagnosis not present

## 2022-07-23 ENCOUNTER — Ambulatory Visit: Payer: Medicaid Other | Admitting: Dermatology

## 2022-08-01 ENCOUNTER — Ambulatory Visit: Payer: Medicaid Other | Admitting: Family Medicine

## 2022-08-12 ENCOUNTER — Encounter: Payer: Self-pay | Admitting: Family Medicine

## 2022-08-12 ENCOUNTER — Ambulatory Visit: Payer: Medicaid Other | Admitting: Family Medicine

## 2022-08-14 ENCOUNTER — Ambulatory Visit: Payer: Medicaid Other | Admitting: Family Medicine

## 2022-08-18 ENCOUNTER — Other Ambulatory Visit: Payer: Self-pay

## 2022-08-18 ENCOUNTER — Ambulatory Visit: Payer: Medicaid Other | Admitting: Gastroenterology

## 2022-10-13 ENCOUNTER — Other Ambulatory Visit: Payer: Self-pay

## 2022-10-13 ENCOUNTER — Ambulatory Visit: Payer: Medicaid Other | Admitting: Gastroenterology

## 2022-10-15 ENCOUNTER — Ambulatory Visit: Payer: Medicaid Other | Admitting: Gastroenterology

## 2022-10-15 ENCOUNTER — Telehealth: Payer: Self-pay

## 2022-10-15 ENCOUNTER — Encounter: Payer: Self-pay | Admitting: Gastroenterology

## 2022-10-15 VITALS — BP 139/85 | HR 98 | Temp 97.9°F | Ht 64.5 in | Wt 229.0 lb

## 2022-10-15 DIAGNOSIS — Z23 Encounter for immunization: Secondary | ICD-10-CM

## 2022-10-15 DIAGNOSIS — K50012 Crohn's disease of small intestine with intestinal obstruction: Secondary | ICD-10-CM

## 2022-10-15 MED ORDER — SHINGRIX 50 MCG/0.5ML IM SUSR: 0.5000 mL | Freq: Once | INTRAMUSCULAR | 0 refills | Status: AC

## 2022-10-15 NOTE — Telephone Encounter (Signed)
Faxed Humira new start to [facility]. Also Sent email to rick letting him know it was coming

## 2022-10-15 NOTE — Progress Notes (Signed)
Cheryl Darby, MD [address]  [place], [place] [address]  Main: [phone]  Fax: [phone]    Gastroenterology Consultation  Referring Provider:     [name], DO Primary Care Physician:  [name], DO Primary Gastroenterologist:  Dr. Cephas Flowers Reason for Consultation: Ileocolonic Crohn's disease        HPI:   Cheryl Flowers is a 41 y.o. female referred by Dr. Wynetta Emery, [name], DO  for consultation & management of small bowel Crohn's  Patient is here for his postoperative management of Crohn's disease.  During last visit, I started patient on Rinvoq.  She started with 45 mg induction dose in August 2023 and in about 4 weeks, she started experiencing symptoms of headache, body aches, had bad cold and chronic cough.  When she went to see physiatry, she was informed that her blood pressure was elevated with systolic in 387F.  She was concerned if her symptoms are secondary to Rinvoq and self discontinued it.  She did not inform me about her symptoms and discontinuation of Rinvoq.  Today, she is here for follow-up.  Patient has not informed her PCP regarding high blood pressure.  She is currently on metoprolol.  She reports that since discontinuation of Rinvoq, she felt significantly better.  Currently, denies any headaches, denies any cold symptoms or cough.  Her blood pressure in the office today was 139/85.  She reports intermittent constipation otherwise denies any GI symptoms.  Her weight has been stable.  Although she gained weight since TI resection.  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  She was initially on budesonide for about a month, later on prednisone followed by initiation of Humira in July 2021.  Repeat colonoscopy revealed persistent stricture of the terminal ileum and therefore Humira was stopped and switched to Entyvio.  Prior to stopping Humira, patient was started on combination therapy, Imuran was added and was managed by her rheumatologist due to diagnosis of sacroiliitis.  Patient developed nausea, headache on Imuran and therefore discontinued it.  Patient was then switched to [facility] since June 2022, she was concerned about mood changes if these are associated with [facility], therefore this was stopped and switched to Humira again since January 2023.  Patient underwent colonoscopy every 6 to 8 months to assess response to biologic, she did have persistent TI stricture, however, MR enterography since her diagnosis showed mild wall thickening with mucosal enhancement involving the distal ileum and cecum including the ileocecal valve.  She underwent 3 MR enterography studies to date which revealed consistent findings as above.  Patient was on Humira monotherapy every other week until she underwent a laparoscopic ileocolonic resection on 01/29/2022 by Dr. Victorio Palm at [facility], 6.6 cm of the TI and 6.3 cm of the colon were removed with primary anastomosis.  Patient's postop recovery was uneventful.  EGD and colonoscopy in 03/2022 did not reveal any postop recurrence of Crohn's disease. Patient tried Rinvoq 45 mg daily for 1 month in October and self discontinued due to symptoms of headache, high blood pressure and chronic cough.  Fecal calprotectin levels was 227 in 07/2020, improved to 289 in 09/2020, 37 in 4/22. CRP has been normal.  Extra intestinal manifestations: Sacroiliitis   IBD surgical history: None   Imaging:   MRE   04/02/2020 IMPRESSION: 1. Mild wall thickening and  mucosal enhancement involving the distal ileum and cecum. This is consistent with Crohn's disease. 2. No evidence of abscess or other complication. 3. Small uterine fibroids. 4. Uterine fibroids.   02/17/2021 IMPRESSION: 1. Short segment of terminal ileum with mild wall thickening and mucosal enhancement noted at the level of the ileal cecal valve. There is also mild wall thickening and enhancement involving the cecum. Findings consistent with Crohn's inflammation. No complications identified. Specifically, there is no abscess, obstruction or sign of penetrating disease. No evidence for penetrating disease or abscess. 2. Mildly increased caliber and enhancement of the proximal appendix which tapers to a normal caliber mid and distal appendix. Favor secondary inflammation. 3. Uterine fibroids.   12/30/2021 IMPRESSION: No significant change in short-segment wall thickening and enhancement involving the terminal ileum and ileocecal valve, consistent with history of Crohn disease. No evidence of penetrating disease or other complication.   CTE none   SBFT none   Procedures:   Index colonoscopy 03/09/2020 - Mucosal ulceration. Biopsied. - Patchy moderate inflammation was found in the cecum, rule out Crohn's disease. Biopsied. - Patchy mild inflammation was found in the transverse colon and in the ascending colon, rule out Crohn's disease. Biopsied. - The descending colon is normal. - Erythematous mucosa in the rectum and in the sigmoid colon. Biopsied. - The distal rectum and anal verge are normal on retroflexion view. DIAGNOSIS:  A. COLON, CECUM; COLD BIOPSY:  - CHRONIC COLITIS WITH FOCAL MILD ACTIVITY.  - NEGATIVE FOR DYSPLASIA AND MALIGNANCY.   B. ILEOCECAL VALVE; COLD BIOPSY:  - CHRONIC ENTERITIS / COLITIS WITH MODERATE ACTIVITY AND ULCERATION.  - NEGATIVE FOR DYSPLASIA AND MALIGNANCY.   C. COLON, ASCENDING; COLD BIOPSY:  - CHRONIC COLITIS WITH MODERATE ACTIVITY.  -  NEGATIVE FOR DYSPLASIA AND MALIGNANCY.   D. COLON, TRANSVERSE; COLD BIOPSY:  - CHRONIC COLITIS WITH MODERATE ACTIVITY.  - NEGATIVE FOR DYSPLASIA AND MALIGNANCY.   E. COLON, DESCENDING; COLD BIOPSY:  - CHRONIC INACTIVE COLITIS.  - NEGATIVE FOR DYSPLASIA AND MALIGNANCY.   F. COLON, RECTOSIGMOID; COLD BIOPSY:  - CHRONIC INACTIVE COLITIS.  - NEGATIVE FOR DYSPLASIA AND MALIGNANCY.      Colonoscopy 07/27/2020 - The examined portion of the ileum was normal. Biopsied. - Crohn's disease. Inflammation was found in the transverse colon, in the ascending colon and at the cecum. This was moderate in severity, worsened compared to previous examinations. Biopsied. - The distal rectum and anal verge are normal on retroflexion view. DIAGNOSIS:  A. COLON, RIGHT; COLD BIOPSY:  - MILD AND FOCALLY MODERATE CHRONIC ACTIVE COLITIS INVOLVING ALL BIOPSY  FRAGMENTS, CONSISTENT WITH PATIENT'S KNOWN HISTORY OF CROHNS.  - FOCAL NON-NECROTIZING GRANULOMAS; AFB AND PASF STAINS ARE NEGATIVE;  STAIN CONTROLS WORKED APPROPRIATELY.  - NEGATIVE FOR DYSPLASIA AND MALIGNANCY.   B.  TERMINAL ILEUM; COLD BIOPSY:  - UNREMARKABLE SMALL INTESTINAL MUCOSA.  - NEGATIVE FOR ACTIVE ENTERITIS, FEATURES OF CHRONICITY, DYSPLASIA, AND  MALIGNANCY.   C.  COLON, LEFT; COLD BIOPSY:  - CHRONIC INACTIVE COLITIS INVOLVING A MINORITY OF THE BIOPSY FRAGMENTS,  CONSISTENT WITH PATIENT'S KNOWN HISTORY OF CROHNS.  - NEGATIVE FOR DYSPLASIA AND MALIGNANCY.    Colonoscopy 03/27/2021 - Stricture at the ileocecal valve. Biopsied. - Decreased mucosa vascular pattern in the  cecum. Biopsied. - One 5 mm polyp in the sigmoid colon, removed with a cold snare. Resected and retrieved. - The rectum, sigmoid colon, descending colon, transverse colon and ascending colon are normal. Biopsied. DIAGNOSIS:  A. TERMINAL ILEUM; COLD BIOPSY:  - MILD CHRONIC ACTIVE ENTERITIS CONSISTENT WITH PATIENT'S HISTORY OF  CROHN'S DISEASE.  - NEGATIVE FOR DYSPLASIA  AND MALIGNANCY.   B.  COLON, CECUM AND ASCENDING; COLD BIOPSY:  - MILD CHRONIC ACTIVE COLITIS CONSISTENT WITH PATIENT'S KNOWN HISTORY OF  CROHN'S.  - NEGATIVE FOR DYSPLASIA AND MALIGNANCY.   C.  COLON, TRANSVERSE; COLD BIOPSY:  - PATCHY CHRONIC INACTIVE COLITIS CONSISTENT WITH PATIENT'S HISTORY OF  CROHN'S.  - NEGATIVE FOR DYSPLASIA AND MALIGNANCY.   D.  COLON, DESCENDING; COLD BIOPSY:  - PATCHY CHRONIC INACTIVE COLITIS CONSISTENT WITH PATIENT'S HISTORY OF  CROHN'S.  - NEGATIVE FOR DYSPLASIA AND MALIGNANCY.   E.  COLON, RECTOSIGMOID; COLD BIOPSY:  - PATCHY CHRONIC INACTIVE COLITIS CONSISTENT WITH PATIENT'S HISTORY OF  CROHN'S.  - NEGATIVE FOR DYSPLASIA AND MALIGNANCY.    F.  COLON POLYP, SIGMOID; COLD SNARE:  - HYPERPLASTIC POLYP.  - NEGATIVE FOR DYSPLASIA AND MALIGNANCY.    Colonoscopy 08/28/2021 - Stricture in the terminal ileum. Biopsied. - Altered vascular mucosa in the ascending colon and in the cecum. Biopsied. - The rectum, sigmoid colon, descending colon and transverse colon are normal. Biopsied. DIAGNOSIS:  A. TERMINAL ILEUM; COLD BIOPSY:  - CHRONIC ILEITIS WITH MILD ACTIVITY.  - NEGATIVE FOR GRANULOMA, DYSPLASIA, AND MALIGNANCY.   B. COLON, ASCENDING AND CECUM; COLD BIOPSY:  - CHRONIC COLITIS WITH MINIMAL ACTIVITY (FOCAL SUPERFICIAL CRYPTITIS).  - NEGATIVE FOR GRANULOMA, DYSPLASIA, AND MALIGNANCY.   C. COLON, TRANSVERSE; COLD BIOPSY:  - CHRONIC COLITIS WITHOUT ACTIVITY.  - NEGATIVE FOR GRANULOMA, DYSPLASIA, AND MALIGNANCY.   D. COLON, DESCENDING; COLD BIOPSY:  - CHRONIC COLITIS WITHOUT ACTIVITY.  - NEGATIVE FOR GRANULOMA, DYSPLASIA, AND MALIGNANCY.   E. COLON, RECTOSIGMOID; COLD BIOPSY:  - CHRONIC COLITIS WITHOUT ACTIVITY.  - NEGATIVE FOR GRANULOMA, DYSPLASIA, AND MALIGNANCY.   Colonoscopy 04/22/2022 - Patent end-to-side ileo-colonic anastomosis, characterized by healthy appearing mucosa. - The entire examined colon is normal. - The distal rectum and  anal verge are normal on retroflexion view. - The examined portion of the ileum was normal. - No specimens collected.  Upper Endoscopy 04/22/2022 - Normal esophagus. - Normal stomach. - Normal examined duodenum. - No specimens collected.    VCE none   IBD medications:   Steroids: Prednisone, budesonide 5-ASA: None Immunomodulators: AZA, methotrexate TPMT status: normal Biologics:  Anti TNFs: Humira July 2021 to June 2022, restarted in January 2023, stopped in 12/2021 prior to ileocolonic resection Anti Integrins: Entyvio from June 2022 to October 2022, stopped secondary to mood changes Ustekinumab: Tofactinib: Upadacitinib: Initiated in August 2023, patient self discontinued after 4 weeks of taking Rinvoq due to high blood pressure, cough and headaches Clinical trial:   NSAIDs: None   Antiplts/Anticoagulants/Anti thrombotics: None  Past Medical History:  Diagnosis Date   Anxiety    Asthma    Crohn's disease ([place])    Depression    GERD (gastroesophageal reflux disease)    Joint pain    secondary to Crohn's   Low back pain    Marital conflict    Migraine headache    approx 2x/month   Panic disorder    Thyromegaly    Tobacco abuse 05/12/2018   Wears dentures    partial upper    Past Surgical History:  Procedure Laterality  Date   CESAREAN SECTION  2003 and 2010   CHOLECYSTECTOMY  2007   COLONOSCOPY WITH PROPOFOL N/A 03/07/2020   Procedure: COLONOSCOPY WITH PROPOFOL;  Surgeon: Virgel Manifold, MD;  Location: [facility] ENDOSCOPY;  Service: Endoscopy;  Laterality: N/A;   COLONOSCOPY WITH PROPOFOL N/A 07/27/2020   Procedure: COLONOSCOPY WITH PROPOFOL;  Surgeon: Virgel Manifold, MD;  Location: [facility] ENDOSCOPY;  Service: Endoscopy;  Laterality: N/A;   COLONOSCOPY WITH PROPOFOL N/A 03/27/2021   Procedure: COLONOSCOPY WITH PROPOFOL;  Surgeon: Virgel Manifold, MD;  Location: [facility] ENDOSCOPY;  Service: Endoscopy;  Laterality: N/A;   COLONOSCOPY WITH PROPOFOL N/A  08/28/2021   Procedure: COLONOSCOPY WITH PROPOFOL;  Surgeon: Virgel Manifold, MD;  Location: [facility] ENDOSCOPY;  Service: Endoscopy;  Laterality: N/A;   COLONOSCOPY WITH PROPOFOL N/A 04/22/2022   Procedure: COLONOSCOPY WITH PROPOFOL;  Surgeon: Lin Landsman, MD;  Location: [facility];  Service: Endoscopy;  Laterality: N/A;   ESOPHAGOGASTRODUODENOSCOPY (EGD) WITH PROPOFOL N/A 04/22/2022   Procedure: ESOPHAGOGASTRODUODENOSCOPY (EGD) WITH PROPOFOL;  Surgeon: Lin Landsman, MD;  Location: [facility];  Service: Endoscopy;  Laterality: N/A;   TUBAL LIGATION  2010     Current Outpatient Medications:    Zoster Vaccine Adjuvanted [facility]) injection, Inject 0.5 mLs into the muscle once for 1 dose., Disp: 1 each, Rfl: 0   acetaminophen (TYLENOL) 500 MG tablet, Take 1-2 tablets (500 mg - 1 g total) by mouth every 6 hours as needed for pain. Prescription not provided, medication is available over the counter. Take as directed on label, Disp: , Rfl:    Albuterol Sulfate, sensor, 108 (90 Base) MCG/ACT AEPB, , Disp: , Rfl:    amphetamine-dextroamphetamine (ADDERALL) 15 MG tablet, , Disp: , Rfl:    budesonide-formoterol (SYMBICORT) 160-4.5 MCG/ACT inhaler, Inhale into the lungs., Disp: , Rfl:    methocarbamol (ROBAXIN) 500 MG tablet, , Disp: , Rfl:    Metoprolol Succinate 25 MG CS24, , Disp: , Rfl:    norethindrone (AYGESTIN) 5 MG tablet, Take 1 tablet by mouth daily., Disp: , Rfl:    pantoprazole (PROTONIX) 40 MG tablet, Take 40 mg by mouth., Disp: , Rfl:    RINVOQ 15 MG TB24, Take 1 tablet by mouth daily., Disp: , Rfl:    RINVOQ 45 MG TB24, Take 1 tablet by mouth daily., Disp: , Rfl:    tiZANidine (ZANAFLEX) 4 MG tablet, Take 1 tablet every 6 hours by oral route., Disp: , Rfl:    Vitamin D, Ergocalciferol, (DRISDOL) 1.25 MG (50000 UNIT) CAPS capsule, Take by mouth., Disp: , Rfl:    Family History  Problem Relation Age of Onset   Kidney disease Mother    Hypertension  Mother    Rheum arthritis Mother    Ankylosing spondylitis Mother    Hypertension Father    Diabetes Father    Breast cancer Maternal Aunt        early 8   Crohn's disease Maternal Aunt    Lupus Maternal Aunt    Heart attack Maternal Grandmother    Stroke Maternal Grandfather      Social History   Tobacco Use   Smoking status: Former    Packs/day: 0.25    Years: 19.00    Total pack years: 4.75    Types: Cigarettes    Quit date: 01/25/2022    Years since quitting: 0.7   Smokeless tobacco: Never  Vaping Use   Vaping Use: Former  Substance Use Topics   Alcohol use: Not Currently  Drug use: No    Allergies as of 10/15/2022 - Review Complete 10/15/2022  Allergen Reaction Noted   Morphine and related Itching 12/15/2015   Sulfa antibiotics Hives 12/15/2015    Review of Systems:    All systems reviewed and negative except where noted in HPI.   Physical Exam:  BP 139/85 (BP Location: Right Leg, Patient Position: Sitting, Cuff Size: Large)   Pulse 98   Temp 97.9 F (36.6 C) (Oral)   Ht 5' 4.5" (1.638 m)   Wt 229 lb (103.9 kg)   BMI 38.70 kg/m  No LMP recorded. (Menstrual status: Oral contraceptives).  General:   Alert,  Well-developed, well-nourished, pleasant and cooperative in NAD Head:  Normocephalic and atraumatic. Eyes:  Sclera clear, no icterus.   Conjunctiva pink. Ears:  Normal auditory acuity. Nose:  No deformity, discharge, or lesions. Mouth:  No deformity or lesions,oropharynx pink & moist. Neck:  Supple; no masses or thyromegaly. Lungs:  Respirations even and unlabored.  Clear throughout to auscultation.   No wheezes, crackles, or rhonchi. No acute distress. Heart:  Regular rate and rhythm; no murmurs, clicks, rubs, or gallops. Abdomen:  Normal bowel sounds. Soft, non-tender and nondistended without masses, hepatosplenomegaly or hernias noted.  No guarding or rebound tenderness.   Rectal: Not performed Msk:  Symmetrical without gross deformities. Good,  equal movement & strength bilaterally. Pulses:  Normal pulses noted. Extremities:  No clubbing or edema.  No cyanosis. Neurologic:  Alert and oriented x3;  grossly normal neurologically. Skin:  Intact without significant lesions or rashes. No jaundice. Psych:  Alert and cooperative. Normal mood and affect.  Imaging Studies: Reviewed  Assessment and Plan:   Cheryl Flowers is a 41 y.o. African-American female with history of ileocolonic Crohn's disease, stricturing phenotype diagnosed in May 2021, treated with budesonide, prednisone followed by Humira and temporarily on combination therapy with Imuran, later switched to [facility], switched back to Humira s/p laparoscopic ileocolic resection on 04/03/6719, with primary anastomosis.  Ileocolonic Crohn's disease with stricturing phenotype: S/p ileocolic resection in 06/4708 Patient is currently not on any biologic therapy.  Patient developed worsening of arthralgias and headaches on Humira as well as Entyvio and she did not want to go back on Entyvio or Humira. Colonoscopy in 03/2022 revealed no postop recurrence of Crohn's disease Today, patient is interested to try Humira again.  Will initiate paperwork today   IBD Health Maintenance   1.TB status: QuantiFERON gold in 09/2021 negative 2. Anemia: None 3.Immunizations: Hep A antibody negative and B immune/reactive, Influenza recommend annual influenza vaccine, prevnar received, pneumovax received, Varicella unknown, Zoster recommend Shingrix vaccine 4.Cancer screening I) Colon cancer/dysplasia surveillance: None II) Cervical cancer: Pap smear up-to-date III) Skin cancer - counseled about annual skin exam by dermatology and skin protection in summer using sun screen SPF > 50, clothing 5.Bone health Vitamin D status: Mild vitamin D deficiency, continue vitamin D supplements Bone density testing: Not performed 5. Labs: Today and every 3 months 6. Smoking: None 7. NSAIDs and Antibiotics  use: None     Follow up in 4 months   Cheryl Darby, MD

## 2022-10-28 ENCOUNTER — Ambulatory Visit: Payer: Medicaid Other | Admitting: Family Medicine

## 2022-10-28 ENCOUNTER — Encounter: Payer: Self-pay | Admitting: Family Medicine

## 2022-10-28 VITALS — BP 142/80 | HR 91 | Ht 64.5 in | Wt 230.9 lb

## 2022-10-28 DIAGNOSIS — I10 Essential (primary) hypertension: Secondary | ICD-10-CM | POA: Diagnosis not present

## 2022-10-28 DIAGNOSIS — F410 Panic disorder [episodic paroxysmal anxiety] without agoraphobia: Secondary | ICD-10-CM

## 2022-10-28 DIAGNOSIS — Z13220 Encounter for screening for lipoid disorders: Secondary | ICD-10-CM

## 2022-10-28 DIAGNOSIS — F332 Major depressive disorder, recurrent severe without psychotic features: Secondary | ICD-10-CM | POA: Diagnosis not present

## 2022-10-28 DIAGNOSIS — F988 Other specified behavioral and emotional disorders with onset usually occurring in childhood and adolescence: Secondary | ICD-10-CM | POA: Diagnosis not present

## 2022-10-28 DIAGNOSIS — E010 Iodine-deficiency related diffuse (endemic) goiter: Secondary | ICD-10-CM | POA: Diagnosis not present

## 2022-10-28 DIAGNOSIS — F411 Generalized anxiety disorder: Secondary | ICD-10-CM | POA: Diagnosis not present

## 2022-10-28 LAB — URINALYSIS, ROUTINE W REFLEX MICROSCOPIC
Bilirubin, UA: NEGATIVE
Glucose, UA: NEGATIVE
Ketones, UA: NEGATIVE
Leukocytes,UA: NEGATIVE
Nitrite, UA: NEGATIVE
Protein,UA: NEGATIVE
Specific Gravity, UA: 1.02 (ref 1.005–1.030)
Urobilinogen, Ur: 1 mg/dL (ref 0.2–1.0)
pH, UA: 7.5 (ref 5.0–7.5)

## 2022-10-28 LAB — MICROSCOPIC EXAMINATION: Bacteria, UA: NONE SEEN

## 2022-10-28 LAB — MICROALBUMIN, URINE WAIVED
Creatinine, Urine Waived: 50 mg/dL (ref 10–300)
Microalb, Ur Waived: 30 mg/L — ABNORMAL HIGH (ref 0–19)

## 2022-10-28 MED ORDER — BUPROPION HCL ER (SR) 150 MG PO TB12: ORAL_TABLET | ORAL | 2 refills | Status: DC

## 2022-10-28 MED ORDER — QUETIAPINE FUMARATE 25 MG PO TABS: 12.5000 mg | ORAL_TABLET | Freq: Every day | ORAL | 2 refills | Status: DC

## 2022-10-28 MED ORDER — LISINOPRIL 5 MG PO TABS: 5.0000 mg | ORAL_TABLET | Freq: Every day | ORAL | 3 refills | Status: DC

## 2022-10-28 NOTE — Assessment & Plan Note (Signed)
Has been off medicine. Will get her records from psychiatry and consider restarting.

## 2022-10-28 NOTE — Assessment & Plan Note (Signed)
Has been off medicine. Not doing well. Will restart her wellbutrin and seroquel and recheck 1 month. Call with any concerns.

## 2022-10-28 NOTE — Assessment & Plan Note (Signed)
Has been getting bigger- will get her set up for repeat US as she hasn't had one in 3 years. Await results.

## 2022-10-28 NOTE — Assessment & Plan Note (Signed)
Has been off medicine. Will start low dose lisinopril and recheck 1 month. Call with any concerns.

## 2022-10-28 NOTE — Progress Notes (Signed)
BP (!) 142/80   Pulse 91   Ht 5' 4.5" (1.638 m)   Wt 230 lb 14.4 oz (104.7 kg)   SpO2 99%   BMI 39.02 kg/m    Subjective:    Patient ID: Cheryl Flowers, female    DOB: June 27, 1981, 42 y.o.   MRN: [id number]  HPI: Cheryl Flowers is a 42 y.o. female who presents today after being lost to follow up for 10 months.   Chief Complaint  Patient presents with   ADHD    Wants to discuss getting back on Adderal   Has been off of all her medicine for months. She notes that her psychiatrist took her off all her medicine and put her on abilify which she states that she didn't tolerate well. She notes that this has been at least about a year.   ANXIETY/DEPRESSION Duration: chronic Status:uncontrolled Anxious mood: yes  Excessive worrying: yes Irritability: yes  Sweating: no Nausea: no Palpitations:no Hyperventilation: no Panic attacks: no Agoraphobia: no  Obscessions/compulsions: no Depressed mood: yes    10/28/2022    2:57 PM 01/23/2022   11:34 AM 09/26/2021    8:58 AM 04/22/2021    8:51 AM 11/05/2020    9:04 AM  Depression screen PHQ 2/9  Decreased Interest '2 2 2 3 '$ 0  Down, Depressed, Hopeless '1 2 2 3 1  '$ PHQ - 2 Score '3 4 4 6 1  '$ Altered sleeping '2 1 3 3 1  '$ Tired, decreased energy '3 2 3 3 '$ 0  Change in appetite 3 2 0 1 0  Feeling bad or failure about yourself  '1 2 2 1 '$ 0  Trouble concentrating '3 3 3 1 1  '$ Moving slowly or fidgety/restless '1 1 2 '$ 0 0  Suicidal thoughts 0 0 1 1 0  PHQ-9 Score '16 15 18 16 3  '$ Difficult doing work/chores Very difficult  Very difficult Extremely dIfficult Not difficult at all   Anhedonia: no Weight changes: no Insomnia: no   Hypersomnia: yes Fatigue/loss of energy: yes Feelings of worthlessness: no Feelings of guilt: no Impaired concentration/indecisiveness: no Suicidal ideations: no  Crying spells: no Recent Stressors/Life Changes: no   Relationship problems: no   Family stress: no     Financial stress: no    Job stress:  no    Recent death/loss: no  HYPERTENSION  Hypertension status: uncontrolled  Satisfied with current treatment? no Duration of hypertension: chronic BP monitoring frequency:  not checking BP medication side effects:  no Medication compliance: poor compliance Previous BP meds:metoprolol Aspirin: no Recurrent headaches: no Visual changes: no Palpitations: no Dyspnea: no Chest pain: no Lower extremity edema: no Dizzy/lightheaded: no   Relevant past medical, surgical, family and social history reviewed and updated as indicated. Interim medical history since our last visit reviewed. Allergies and medications reviewed and updated.  Review of Systems  Constitutional: Negative.   Respiratory: Negative.    Cardiovascular: Negative.   Gastrointestinal: Negative.   Musculoskeletal: Negative.   Skin: Negative.   Neurological: Negative.   Psychiatric/Behavioral:  Positive for decreased concentration, dysphoric mood and sleep disturbance. Negative for agitation, behavioral problems, confusion, hallucinations, self-injury and suicidal ideas. The patient is not nervous/anxious and is not hyperactive.     Per HPI unless specifically indicated above     Objective:    BP (!) 142/80   Pulse 91   Ht 5' 4.5" (1.638 m)   Wt 230 lb 14.4 oz (104.7 kg)   SpO2 99%   BMI 39.02  kg/m   Wt Readings from Last 3 Encounters:  10/28/22 230 lb 14.4 oz (104.7 kg)  10/15/22 229 lb (103.9 kg)  04/22/22 228 lb 9.6 oz (103.7 kg)    Physical Exam Vitals and nursing note reviewed.  Constitutional:      General: She is not in acute distress.    Appearance: Normal appearance. She is not ill-appearing, toxic-appearing or diaphoretic.  HENT:     Head: Normocephalic and atraumatic.     Right Ear: External ear normal.     Left Ear: External ear normal.     Nose: Nose normal.     Mouth/Throat:     Mouth: Mucous membranes are moist.     Pharynx: Oropharynx is clear.  Eyes:     General: No scleral  icterus.       Right eye: No discharge.        Left eye: No discharge.     Extraocular Movements: Extraocular movements intact.     Conjunctiva/sclera: Conjunctivae normal.     Pupils: Pupils are equal, round, and reactive to light.  Cardiovascular:     Rate and Rhythm: Normal rate and regular rhythm.     Pulses: Normal pulses.     Heart sounds: Normal heart sounds. No murmur heard.    No friction rub. No gallop.  Pulmonary:     Effort: Pulmonary effort is normal. No respiratory distress.     Breath sounds: Normal breath sounds. No stridor. No wheezing, rhonchi or rales.  Chest:     Chest wall: No tenderness.  Musculoskeletal:        General: Normal range of motion.     Cervical back: Normal range of motion and neck supple.  Skin:    General: Skin is warm and dry.     Capillary Refill: Capillary refill takes less than 2 seconds.     Coloration: Skin is not jaundiced or pale.     Findings: No bruising, erythema, lesion or rash.  Neurological:     General: No focal deficit present.     Mental Status: She is alert and oriented to person, place, and time. Mental status is at baseline.  Psychiatric:        Mood and Affect: Mood normal.        Behavior: Behavior normal.        Thought Content: Thought content normal.        Judgment: Judgment normal.     Results for orders placed or performed in visit on 08/12/22  HM PAP SMEAR  Result Value Ref Range   HM Pap smear see result scanned into chart       Assessment & Plan:   Problem List Items Addressed This Visit       Cardiovascular and Mediastinum   Essential hypertension - Primary    Has been off medicine. Will start low dose lisinopril and recheck 1 month. Call with any concerns.       Relevant Medications   lisinopril (ZESTRIL) 5 MG tablet   Other Relevant Orders   CBC with Differential/Platelet   Comprehensive metabolic panel   Urinalysis, Routine w reflex microscopic   TSH   Microalbumin, Urine Waived      Endocrine   Thyromegaly    Has been getting bigger- will get her set up for repeat US as she hasn't had one in 3 years. Await results.       Relevant Orders   US THYROID     Other  Generalized anxiety disorder with panic attacks    Has been off medicine. Not doing well. Will restart her wellbutrin and seroquel and recheck 1 month. Call with any concerns.       Relevant Medications   buPROPion (WELLBUTRIN SR) 150 MG 12 hr tablet   Other Relevant Orders   CBC with Differential/Platelet   Comprehensive metabolic panel   Attention deficit disorder (ADD) in adult    Has been off medicine. Will get her records from psychiatry and consider restarting.       Relevant Orders   CBC with Differential/Platelet   Comprehensive metabolic panel   Severe recurrent major depression without psychotic features ([place])    Has been off medicine. Not doing well. Will restart her wellbutrin and seroquel and recheck 1 month. Call with any concerns.       Relevant Medications   buPROPion (WELLBUTRIN SR) 150 MG 12 hr tablet   Other Relevant Orders   CBC with Differential/Platelet   Comprehensive metabolic panel   TSH   Other Visit Diagnoses     Screening for cholesterol level       Checking labs today. Await results. Treat as needed.   Relevant Orders   CBC with Differential/Platelet   Comprehensive metabolic panel   Lipid Panel w/o Chol/HDL Ratio        Follow up plan: Return in about 4 weeks (around 11/25/2022) for records release psychiatrist please.

## 2022-10-29 LAB — CBC WITH DIFFERENTIAL/PLATELET
Basophils Absolute: 0 10*3/uL (ref 0.0–0.2)
Basos: 0 %
EOS (ABSOLUTE): 0.1 10*3/uL (ref 0.0–0.4)
Eos: 1 %
Hematocrit: 38.7 % (ref 34.0–46.6)
Hemoglobin: 13.4 g/dL (ref 11.1–15.9)
Immature Grans (Abs): 0 10*3/uL (ref 0.0–0.1)
Immature Granulocytes: 0 %
Lymphocytes Absolute: 3.1 10*3/uL (ref 0.7–3.1)
Lymphs: 33 %
MCH: 31.9 pg (ref 26.6–33.0)
MCHC: 34.6 g/dL (ref 31.5–35.7)
MCV: 92 fL (ref 79–97)
Monocytes Absolute: 0.5 10*3/uL (ref 0.1–0.9)
Monocytes: 6 %
Neutrophils Absolute: 5.5 10*3/uL (ref 1.4–7.0)
Neutrophils: 60 %
Platelets: 365 10*3/uL (ref 150–450)
RBC: 4.2 x10E6/uL (ref 3.77–5.28)
RDW: 11.4 % — ABNORMAL LOW (ref 11.7–15.4)
WBC: 9.2 10*3/uL (ref 3.4–10.8)

## 2022-10-29 LAB — COMPREHENSIVE METABOLIC PANEL
ALT: 22 IU/L (ref 0–32)
AST: 15 IU/L (ref 0–40)
Albumin/Globulin Ratio: 1.5 (ref 1.2–2.2)
Albumin: 4.5 g/dL (ref 3.9–4.9)
Alkaline Phosphatase: 73 IU/L (ref 44–121)
BUN/Creatinine Ratio: 10 (ref 9–23)
BUN: 9 mg/dL (ref 6–24)
Bilirubin Total: <0.2 mg/dL (ref 0.0–1.2)
CO2: 19 mmol/L — ABNORMAL LOW (ref 20–29)
Calcium: 9.7 mg/dL (ref 8.7–10.2)
Chloride: 107 mmol/L — ABNORMAL HIGH (ref 96–106)
Creatinine, Ser: 0.89 mg/dL (ref 0.57–1.00)
Globulin, Total: 3 g/dL (ref 1.5–4.5)
Glucose: 81 mg/dL (ref 70–99)
Potassium: 4.1 mmol/L (ref 3.5–5.2)
Sodium: 143 mmol/L (ref 134–144)
Total Protein: 7.5 g/dL (ref 6.0–8.5)
eGFR: 84 mL/min/{1.73_m2} (ref >59)

## 2022-10-29 LAB — LIPID PANEL W/O CHOL/HDL RATIO
Cholesterol, Total: 166 mg/dL (ref 100–199)
HDL: 41 mg/dL (ref >39)
LDL Chol Calc (NIH): 106 mg/dL — ABNORMAL HIGH (ref 0–99)
Triglycerides: 106 mg/dL (ref 0–149)
VLDL Cholesterol Cal: 19 mg/dL (ref 5–40)

## 2022-10-29 LAB — TSH: TSH: 0.459 u[IU]/mL (ref 0.450–4.500)

## 2022-10-31 ENCOUNTER — Ambulatory Visit: Payer: Medicaid Other

## 2022-11-07 ENCOUNTER — Ambulatory Visit
Admission: RE | Admit: 2022-11-07 | Discharge: 2022-11-07 | Disposition: A | Discharge status: Home / Self Care | Payer: Medicaid Other | Source: Ambulatory Visit | Attending: Family Medicine | Admitting: Family Medicine

## 2022-11-07 DIAGNOSIS — E010 Iodine-deficiency related diffuse (endemic) goiter: Secondary | ICD-10-CM | POA: Diagnosis not present

## 2022-11-07 DIAGNOSIS — E049 Nontoxic goiter, unspecified: Secondary | ICD-10-CM | POA: Diagnosis not present

## 2022-11-23 ENCOUNTER — Other Ambulatory Visit: Payer: Self-pay | Admitting: Family Medicine

## 2022-11-24 NOTE — Telephone Encounter (Signed)
Unable to refill per protocol, Rx request was refilled 10/28/22 for 30 days and 2 refills.  Requested Prescriptions  Pending Prescriptions Disp Refills   buPROPion (WELLBUTRIN SR) 150 MG 12 hr tablet [Pharmacy Med Name: BUPROPION HCL SR 150 MG TABLET] 180 tablet 1    Sig: TAKE 1 TABLET BY MOUTH EACH MORNING FOR 2 WKS THEN INCREASE TO 1 TABLET TWICE DAILY     Psychiatry: Antidepressants - bupropion Failed - 11/23/2022 10:30 AM      Failed - Last BP in normal range    BP Readings from Last 1 Encounters:  10/28/22 (!) 142/80         Passed - Cr in normal range and within 360 days    Creatinine, Ser  Date Value Ref Range Status  10/28/2022 0.89 0.57 - 1.00 mg/dL Final         Passed - AST in normal range and within 360 days    AST  Date Value Ref Range Status  10/28/2022 15 0 - 40 IU/L Final         Passed - ALT in normal range and within 360 days    ALT  Date Value Ref Range Status  10/28/2022 22 0 - 32 IU/L Final         Passed - Completed PHQ-2 or PHQ-9 in the last 360 days      Passed - Valid encounter within last 6 months    Recent Outpatient Visits           3 weeks ago Essential hypertension   [place], Megan P, DO   10 months ago Moderate episode of recurrent major depressive disorder [facility])   [place], Megan P, DO   1 year ago Peripheral edema   [place], [place], DO   1 year ago [place], [place], DO   1 year ago Acute right-sided low back pain with right-sided sciatica   [place], [place], DO       Future Appointments             In 2 days [name], DO [place]   In 2 months Vanga, Tally Due, MD [facility]             QUEtiapine (SEROQUEL) 25 MG tablet [Pharmacy Med Name:  QUETIAPINE FUMARATE 25 MG TAB] 90 tablet 1    Sig: TAKE 0.5-1 TABLETS (12.5-25 MG TOTAL) BY MOUTH AT BEDTIME.     Not Delegated - Psychiatry:  Antipsychotics - Second Generation (Atypical) - quetiapine Failed - 11/23/2022 10:30 AM      Failed - This refill cannot be delegated      Failed - Last BP in normal range    BP Readings from Last 1 Encounters:  10/28/22 (!) 142/80         Failed - Lipid Panel in normal range within the last 12 months    Cholesterol, Total  Date Value Ref Range Status  10/28/2022 166 100 - 199 mg/dL Final   LDL Chol Calc (NIH)  Date Value Ref Range Status  10/28/2022 106 (H) 0 - 99 mg/dL Final   HDL  Date Value Ref Range Status  10/28/2022 41 >39 mg/dL Final   Triglycerides  Date Value Ref Range Status  10/28/2022 106 0 - 149  mg/dL Final         Passed - TSH in normal range and within 360 days    TSH  Date Value Ref Range Status  10/28/2022 0.459 0.450 - 4.500 uIU/mL Final         Passed - Completed PHQ-2 or PHQ-9 in the last 360 days      Passed - Last Heart Rate in normal range    Pulse Readings from Last 1 Encounters:  10/28/22 91         Passed - Valid encounter within last 6 months    Recent Outpatient Visits           3 weeks ago Essential hypertension   [place], Megan P, DO   10 months ago Moderate episode of recurrent major depressive disorder ([place])   [place], Megan P, DO   1 year ago Peripheral edema   [place], [place], DO   1 year ago [place], DO   1 year ago Acute right-sided low back pain with right-sided sciatica   [place], [place], DO       Future Appointments             In 2 days [name], DO [place] [place] [facility]   In 2 months Vanga, Tally Due, MD [place]  [facility] [place] within normal limits and completed in the last 12 months    WBC  Date Value Ref Range Status  10/28/2022 9.2 3.4 - 10.8 x10E3/uL Final  01/09/2021 14.4 (H) 4.0 - 10.5 K/uL Final   RBC  Date Value Ref Range Status  10/28/2022 4.20 3.77 - 5.28 x10E6/uL Final  01/09/2021 4.20 3.87 - 5.11 MIL/uL Final   Hemoglobin  Date Value Ref Range Status  10/28/2022 13.4 11.1 - 15.9 g/dL Final   Hematocrit  Date Value Ref Range Status  10/28/2022 38.7 34.0 - 46.6 % Final   MCHC  Date Value Ref Range Status  10/28/2022 34.6 31.5 - 35.7 g/dL Final  01/09/2021 34.0 30.0 - 36.0 g/dL Final   [facility]  Date Value Ref Range Status  10/28/2022 31.9 26.6 - 33.0 pg Final  01/09/2021 31.7 26.0 - 34.0 pg Final   MCV  Date Value Ref Range Status  10/28/2022 92 79 - 97 fL Final   No results found for: "PLTCOUNTKUC", "LABPLAT", "POCPLA" RDW  Date Value Ref Range Status  10/28/2022 11.4 (L) 11.7 - 15.4 % Final         Passed - CMP within normal limits and completed in the last 12 months    Albumin  Date Value Ref Range Status  10/28/2022 4.5 3.9 - 4.9 g/dL Final   Alkaline Phosphatase  Date Value Ref Range Status  10/28/2022 73 44 - 121 IU/L Final   ALT  Date Value Ref Range Status  10/28/2022 22 0 - 32 IU/L Final   AST  Date Value Ref Range Status  10/28/2022 15 0 - 40 IU/L Final   BUN  Date Value Ref Range Status  10/28/2022 9 6 - 24 mg/dL Final   Calcium  Date Value Ref Range Status  10/28/2022 9.7 8.7 - 10.2 mg/dL Final   CO2  Date Value Ref Range Status  10/28/2022  19 (L) 20 - 29 mmol/L Final   Bicarbonate  Date Value Ref Range Status  02/20/2018 19.2 (L) 20.0 - 28.0 mmol/L Final   Creatinine, Ser  Date Value Ref Range Status  10/28/2022 0.89 0.57 - 1.00 mg/dL Final   Glucose  Date Value Ref Range Status  10/28/2022 81 70 - 99 mg/dL Final   Glucose, Bld  Date Value Ref Range Status  01/09/2021 120 (H) 70 -  99 mg/dL Final    Comment:    Glucose reference range applies only to samples taken after fasting for at least 8 hours.   Potassium  Date Value Ref Range Status  10/28/2022 4.1 3.5 - 5.2 mmol/L Final   Sodium  Date Value Ref Range Status  10/28/2022 143 134 - 144 mmol/L Final   Bilirubin Total  Date Value Ref Range Status  10/28/2022 <0.2 0.0 - 1.2 mg/dL Final   Protein, ur  Date Value Ref Range Status  01/09/2021 NEGATIVE NEGATIVE mg/dL Final   Protein,UA  Date Value Ref Range Status  10/28/2022 Negative Negative/Trace Final   Total Protein  Date Value Ref Range Status  10/28/2022 7.5 6.0 - 8.5 g/dL Final   GFR calc Af Amer  Date Value Ref Range Status  10/16/2020 102 >59 mL/min/1.73 Final    Comment:    **In accordance with recommendations from the NKF-ASN Task force,**   [facility] is in the process of updating its eGFR calculation to the   2021 CKD-EPI creatinine equation that estimates kidney function   without a race variable.    eGFR  Date Value Ref Range Status  10/28/2022 84 >59 mL/min/1.73 Final   GFR, Estimated  Date Value Ref Range Status  01/09/2021 >60 >60 mL/min Final    Comment:    (NOTE) Calculated using the CKD-EPI Creatinine Equation (2021)

## 2022-11-26 ENCOUNTER — Ambulatory Visit (INDEPENDENT_AMBULATORY_CARE_PROVIDER_SITE_OTHER): Payer: Medicaid Other | Admitting: Family Medicine

## 2022-11-26 ENCOUNTER — Encounter: Payer: Self-pay | Admitting: Family Medicine

## 2022-11-26 VITALS — BP 106/71 | HR 81 | Temp 98.3°F | Wt 233.4 lb

## 2022-11-26 DIAGNOSIS — F411 Generalized anxiety disorder: Secondary | ICD-10-CM | POA: Diagnosis not present

## 2022-11-26 DIAGNOSIS — I10 Essential (primary) hypertension: Secondary | ICD-10-CM

## 2022-11-26 DIAGNOSIS — F410 Panic disorder [episodic paroxysmal anxiety] without agoraphobia: Secondary | ICD-10-CM | POA: Diagnosis not present

## 2022-11-26 DIAGNOSIS — F332 Major depressive disorder, recurrent severe without psychotic features: Secondary | ICD-10-CM | POA: Diagnosis not present

## 2022-11-26 MED ORDER — QUETIAPINE FUMARATE 25 MG PO TABS: 12.5000 mg | ORAL_TABLET | Freq: Every day | ORAL | 0 refills | Status: DC

## 2022-11-26 MED ORDER — BUPROPION HCL ER (XL) 300 MG PO TB24: 300.0000 mg | ORAL_TABLET | Freq: Every day | ORAL | 0 refills | Status: DC

## 2022-11-26 MED ORDER — LISINOPRIL 5 MG PO TABS: 5.0000 mg | ORAL_TABLET | Freq: Every day | ORAL | 1 refills | Status: DC

## 2022-11-26 NOTE — Assessment & Plan Note (Signed)
Under good control on current regimen. Continue current regimen. Continue to monitor. Call with any concerns. Refills given. Labs drawn today.

## 2022-11-26 NOTE — Progress Notes (Signed)
BP 106/71   Pulse 81   Temp 98.3 F (36.8 C) (Oral)   Wt 233 lb 6.4 oz (105.9 kg)   SpO2 99%   BMI 39.44 kg/m    Subjective:    Patient ID: Cheryl Flowers, female    DOB: [birth year], 42 y.o.   MRN: [id number]  HPI: Cheryl Flowers is a 42 y.o. female  Chief Complaint  Patient presents with   Depression   Hypertension   HYPERTENSION  Hypertension status: controlled  Satisfied with current treatment? yes Duration of hypertension: chronic BP monitoring frequency:  not checking BP medication side effects:  no Medication compliance: excellent compliance Previous BP meds:lisinopril Aspirin: no Recurrent headaches: no Visual changes: no Palpitations: no Dyspnea: no Chest pain: no Lower extremity edema: no Dizzy/lightheaded: no  ANXIETY/DEPRESSION Duration: chronic Status:stable Anxious mood: yes  Excessive worrying: yes Irritability: no  Sweating: no Nausea: no Palpitations:no Hyperventilation: no Panic attacks: yes Agoraphobia: no  Obscessions/compulsions: no Depressed mood: no    11/26/2022    9:08 AM 10/28/2022    2:57 PM 01/23/2022   11:34 AM 09/26/2021    8:58 AM 04/22/2021    8:51 AM  Depression screen PHQ 2/9  Decreased Interest '2 2 2 2 3  '$ Down, Depressed, Hopeless '1 1 2 2 3  '$ PHQ - 2 Score '3 3 4 4 6  '$ Altered sleeping '2 2 1 3 3  '$ Tired, decreased energy '3 3 2 3 3  '$ Change in appetite '1 3 2 '$ 0 1  Feeling bad or failure about yourself  '1 1 2 2 1  '$ Trouble concentrating '3 3 3 3 1  '$ Moving slowly or fidgety/restless '1 1 1 2 '$ 0  Suicidal thoughts 0 0 0 1 1  PHQ-9 Score '14 16 15 18 16  '$ Difficult doing work/chores Very difficult Very difficult  Very difficult Extremely dIfficult      11/26/2022    9:08 AM 10/28/2022    2:57 PM 01/23/2022   11:34 AM 09/26/2021    8:58 AM  GAD 7 : Generalized Anxiety Score  Nervous, Anxious, on Edge '3 2 2 3  '$ Control/stop worrying '1 1 2 2  '$ Worry too much - different things '1 1 2 3  '$ Trouble relaxing '2 1 1 2   '$ Restless 1 0 1 2  Easily annoyed or irritable '3 2 3 3  '$ Afraid - awful might happen '2 2 3 3  '$ Total GAD 7 Score '13 9 14 18  '$ Anxiety Difficulty  Very difficult Very difficult    Anhedonia: no Weight changes: no Insomnia: no   Hypersomnia: no Fatigue/loss of energy: no Feelings of worthlessness: no Feelings of guilt: no Impaired concentration/indecisiveness: no Suicidal ideations: no  Crying spells: no Recent Stressors/Life Changes: no   Relationship problems: no   Family stress: no     Financial stress: no    Job stress: no    Recent death/loss: no  Relevant past medical, surgical, family and social history reviewed and updated as indicated. Interim medical history since our last visit reviewed. Allergies and medications reviewed and updated.  Review of Systems  Constitutional: Negative.   Respiratory: Negative.    Cardiovascular: Negative.   Gastrointestinal: Negative.   Musculoskeletal: Negative.   Neurological: Negative.   Psychiatric/Behavioral: Negative.      Per HPI unless specifically indicated above     Objective:    BP 106/71   Pulse 81   Temp 98.3 F (36.8 C) (Oral)   Wt 233 lb 6.4  oz (105.9 kg)   SpO2 99%   BMI 39.44 kg/m   Wt Readings from Last 3 Encounters:  11/26/22 233 lb 6.4 oz (105.9 kg)  10/28/22 230 lb 14.4 oz (104.7 kg)  10/15/22 229 lb (103.9 kg)    Physical Exam Vitals and nursing note reviewed.  Constitutional:      General: She is not in acute distress.    Appearance: Normal appearance. She is obese. She is not ill-appearing, toxic-appearing or diaphoretic.  HENT:     Head: Normocephalic and atraumatic.     Right Ear: External ear normal.     Left Ear: External ear normal.     Nose: Nose normal.     Mouth/Throat:     Mouth: Mucous membranes are moist.     Pharynx: Oropharynx is clear.  Eyes:     General: No scleral icterus.       Right eye: No discharge.        Left eye: No discharge.     Extraocular Movements: Extraocular  movements intact.     Conjunctiva/sclera: Conjunctivae normal.     Pupils: Pupils are equal, round, and reactive to light.  Cardiovascular:     Rate and Rhythm: Normal rate and regular rhythm.     Pulses: Normal pulses.     Heart sounds: Normal heart sounds. No murmur heard.    No friction rub. No gallop.  Pulmonary:     Effort: Pulmonary effort is normal. No respiratory distress.     Breath sounds: Normal breath sounds. No stridor. No wheezing, rhonchi or rales.  Chest:     Chest wall: No tenderness.  Musculoskeletal:        General: Normal range of motion.     Cervical back: Normal range of motion and neck supple.  Skin:    General: Skin is warm and dry.     Capillary Refill: Capillary refill takes less than 2 seconds.     Coloration: Skin is not jaundiced or pale.     Findings: No bruising, erythema, lesion or rash.  Neurological:     General: No focal deficit present.     Mental Status: She is alert and oriented to person, place, and time. Mental status is at baseline.  Psychiatric:        Mood and Affect: Mood normal.        Behavior: Behavior normal.        Thought Content: Thought content normal.        Judgment: Judgment normal.     Results for orders placed or performed in visit on 10/28/22  Microscopic Examination   Urine  Result Value Ref Range   WBC, UA 0-5 0 - 5 /hpf   RBC, Urine 0-2 0 - 2 /hpf   Epithelial Cells (non renal) 0-10 0 - 10 /hpf   Bacteria, UA None seen None seen/Few  CBC with Differential/Platelet  Result Value Ref Range   WBC 9.2 3.4 - 10.8 x10E3/uL   RBC 4.20 3.77 - 5.28 x10E6/uL   Hemoglobin 13.4 11.1 - 15.9 g/dL   Hematocrit 38.7 34.0 - 46.6 %   MCV 92 79 - 97 fL   MCH 31.9 26.6 - 33.0 pg   MCHC 34.6 31.5 - 35.7 g/dL   RDW 11.4 (L) 11.7 - 15.4 %   Platelets 365 150 - 450 x10E3/uL   Neutrophils 60 Not Estab. %   Lymphs 33 Not Estab. %   Monocytes 6 Not Estab. %   Eos  1 Not Estab. %   Basos 0 Not Estab. %   Neutrophils Absolute 5.5  1.4 - 7.0 x10E3/uL   Lymphocytes Absolute 3.1 0.7 - 3.1 x10E3/uL   Monocytes Absolute 0.5 0.1 - 0.9 x10E3/uL   EOS (ABSOLUTE) 0.1 0.0 - 0.4 x10E3/uL   Basophils Absolute 0.0 0.0 - 0.2 x10E3/uL   Immature Granulocytes 0 Not Estab. %   Immature Grans (Abs) 0.0 0.0 - 0.1 x10E3/uL  Comprehensive metabolic panel  Result Value Ref Range   Glucose 81 70 - 99 mg/dL   BUN 9 6 - 24 mg/dL   Creatinine, Ser 0.89 0.57 - 1.00 mg/dL   eGFR 84 >59 mL/min/1.73   BUN/Creatinine Ratio 10 9 - 23   Sodium 143 134 - 144 mmol/L   Potassium 4.1 3.5 - 5.2 mmol/L   Chloride 107 (H) 96 - 106 mmol/L   CO2 19 (L) 20 - 29 mmol/L   Calcium 9.7 8.7 - 10.2 mg/dL   Total Protein 7.5 6.0 - 8.5 g/dL   Albumin 4.5 3.9 - 4.9 g/dL   Globulin, Total 3.0 1.5 - 4.5 g/dL   Albumin/Globulin Ratio 1.5 1.2 - 2.2   Bilirubin Total <0.2 0.0 - 1.2 mg/dL   Alkaline Phosphatase 73 44 - 121 IU/L   AST 15 0 - 40 IU/L   ALT 22 0 - 32 IU/L  Lipid Panel w/o Chol/HDL Ratio  Result Value Ref Range   Cholesterol, Total 166 100 - 199 mg/dL   Triglycerides 106 0 - 149 mg/dL   HDL 41 >39 mg/dL   VLDL Cholesterol Cal 19 5 - 40 mg/dL   LDL Chol Calc (NIH) 106 (H) 0 - 99 mg/dL  Urinalysis, Routine w reflex microscopic  Result Value Ref Range   Specific Gravity, UA 1.020 1.005 - 1.030   pH, UA 7.5 5.0 - 7.5   Color, UA Yellow Yellow   Appearance Ur Clear Clear   Leukocytes,UA Negative Negative   Protein,UA Negative Negative/Trace   Glucose, UA Negative Negative   Ketones, UA Negative Negative   RBC, UA 1+ (A) Negative   Bilirubin, UA Negative Negative   Urobilinogen, Ur 1.0 0.2 - 1.0 mg/dL   Nitrite, UA Negative Negative   Microscopic Examination See below:   TSH  Result Value Ref Range   TSH 0.459 0.450 - 4.500 uIU/mL  Microalbumin, Urine Waived  Result Value Ref Range   Microalb, Ur Waived 30 (H) 0 - 19 mg/L   Creatinine, Urine Waived 50 10 - 300 mg/dL   Microalb/Creat Ratio 30-300 (H) <30 mg/g      Assessment & Plan:    Problem List Items Addressed This Visit       Cardiovascular and Mediastinum   Essential hypertension - Primary    Under good control on current regimen. Continue current regimen. Continue to monitor. Call with any concerns. Refills given. Labs drawn today.       Relevant Medications   lisinopril (ZESTRIL) 5 MG tablet   Other Relevant Orders   Basic metabolic panel     Other   Generalized anxiety disorder with panic attacks    Doing better, but not quite there. Will give her another 2 weeks to see how well she does. If not doing better will add trintellix as she has failed cymbalta, lexapro and sertraline in the past.       Relevant Medications   buPROPion (WELLBUTRIN XL) 300 MG 24 hr tablet   Severe recurrent major depression without psychotic features ([place])  Doing better, but not quite there. Will give her another 2 weeks to see how well she does. If not doing better will add trintellix as she has failed cymbalta, lexapro and sertraline in the past.       Relevant Medications   buPROPion (WELLBUTRIN XL) 300 MG 24 hr tablet     Follow up plan: Return in about 2 months (around 01/25/2023).

## 2022-11-26 NOTE — Assessment & Plan Note (Signed)
Doing better, but not quite there. Will give her another 2 weeks to see how well she does. If not doing better will add trintellix as she has failed cymbalta, lexapro and sertraline in the past.

## 2022-11-27 LAB — BASIC METABOLIC PANEL
BUN/Creatinine Ratio: 11 (ref 9–23)
BUN: 9 mg/dL (ref 6–24)
CO2: 20 mmol/L (ref 20–29)
Calcium: 9.1 mg/dL (ref 8.7–10.2)
Chloride: 105 mmol/L (ref 96–106)
Creatinine, Ser: 0.8 mg/dL (ref 0.57–1.00)
Glucose: 88 mg/dL (ref 70–99)
Potassium: 4.1 mmol/L (ref 3.5–5.2)
Sodium: 139 mmol/L (ref 134–144)
eGFR: 95 mL/min/{1.73_m2} (ref >59)

## 2022-12-10 ENCOUNTER — Encounter: Payer: Self-pay | Admitting: Family Medicine

## 2022-12-11 ENCOUNTER — Telehealth: Payer: Self-pay

## 2022-12-11 ENCOUNTER — Other Ambulatory Visit: Payer: Self-pay | Admitting: Family Medicine

## 2022-12-11 MED ORDER — VORTIOXETINE HBR 5 MG PO TABS: 5.0000 mg | ORAL_TABLET | Freq: Every day | ORAL | 1 refills | Status: DC

## 2022-12-11 NOTE — Telephone Encounter (Signed)
Prior authorization was initiated via CoverMyMeds for Trintellix 5 MG. Awaiting for determination from patient's insurance.  Patient was approved via CoverMyMeds. Patient notified via [place].   KEY: [id number]

## 2022-12-15 DIAGNOSIS — R928 Other abnormal and inconclusive findings on diagnostic imaging of breast: Secondary | ICD-10-CM | POA: Diagnosis not present

## 2022-12-24 DIAGNOSIS — N6489 Other specified disorders of breast: Secondary | ICD-10-CM | POA: Diagnosis not present

## 2022-12-24 DIAGNOSIS — Z1231 Encounter for screening mammogram for malignant neoplasm of breast: Secondary | ICD-10-CM | POA: Diagnosis not present

## 2022-12-24 DIAGNOSIS — N6459 Other signs and symptoms in breast: Secondary | ICD-10-CM | POA: Diagnosis not present

## 2022-12-24 LAB — HM PAP SMEAR

## 2023-01-02 ENCOUNTER — Telehealth: Payer: Self-pay | Admitting: Family Medicine

## 2023-01-02 NOTE — Telephone Encounter (Signed)
OK- we can get that filled out.

## 2023-01-02 NOTE — Telephone Encounter (Signed)
The patients husband is the caregiver and he needs the paperwork completed for his job to take care of her.

## 2023-01-02 NOTE — Telephone Encounter (Signed)
Pt's spouse brought in FMLA paperwork for his job as he is her caregiver.  He stated that one was filled out last year and that the provider knows the situation.  Put in provider's folder.

## 2023-01-02 NOTE — Telephone Encounter (Signed)
Husband needs appt

## 2023-01-05 NOTE — Telephone Encounter (Signed)
FMLA Paperwork has been completed and placed in provider's folder for signature. Spoke with patient to inform her that there is a place for the employer to sign. Patient husband Cheryl Flowers says he will stop by on Wednesday for pick up as well.

## 2023-01-26 ENCOUNTER — Ambulatory Visit: Payer: Medicaid Other | Admitting: Family Medicine

## 2023-01-26 VITALS — BP 121/80 | HR 84 | Temp 98.5°F | Ht 64.5 in | Wt 229.4 lb

## 2023-01-26 DIAGNOSIS — I10 Essential (primary) hypertension: Secondary | ICD-10-CM

## 2023-01-26 DIAGNOSIS — F332 Major depressive disorder, recurrent severe without psychotic features: Secondary | ICD-10-CM | POA: Diagnosis not present

## 2023-01-26 DIAGNOSIS — F411 Generalized anxiety disorder: Secondary | ICD-10-CM

## 2023-01-26 DIAGNOSIS — F410 Panic disorder [episodic paroxysmal anxiety] without agoraphobia: Secondary | ICD-10-CM | POA: Diagnosis not present

## 2023-01-26 MED ORDER — QUETIAPINE FUMARATE 25 MG PO TABS: 12.5000 mg | ORAL_TABLET | Freq: Two times a day (BID) | ORAL | 1 refills | Status: DC

## 2023-01-26 MED ORDER — LISINOPRIL 5 MG PO TABS: 5.0000 mg | ORAL_TABLET | Freq: Every day | ORAL | 1 refills | Status: DC

## 2023-01-26 NOTE — Assessment & Plan Note (Signed)
Under good control on current regimen. Continue current regimen. Continue to monitor. Call with any concerns. Refills given. Labs drawn today.   

## 2023-01-26 NOTE — Assessment & Plan Note (Addendum)
Doing pretty well not on her trintellix and wellbutrin. Continue seroquel, can take additional dose as needed during the day to help with agoraphobia. Call with any concerns. Continue to monitor.

## 2023-01-26 NOTE — Progress Notes (Signed)
BP 121/80   Pulse 84   Temp 98.5 F (36.9 C) (Oral)   Ht 5' 4.5" (1.638 m)   Wt 229 lb 6.4 oz (104.1 kg)   SpO2 99%   BMI 38.77 kg/m    Subjective:    Patient ID: Cheryl Flowers, female    DOB: [birth year], 42 y.o.   MRN: [id number]  HPI: Cheryl Flowers is a 42 y.o. female  Chief Complaint  Patient presents with   Depression   Hypertension   Anxiety   HYPERTENSION  Hypertension status: controlled  Satisfied with current treatment? yes Duration of hypertension: chronic BP medication side effects:  no Medication compliance: excellent compliance Previous BP meds: lisinopril Aspirin: no Recurrent headaches: no Visual changes: no Palpitations: no Dyspnea: no Chest pain: no Lower extremity edema: no Dizzy/lightheaded: no  ANXIETY/DEPRESSION- stopped her wellbutrin and her trintellix. Has been off the trintellix about a couple of months and the wellbutrin about a week Duration: chronic Status:better Anxious mood: yes  Excessive worrying: yes Irritability: no  Sweating: no Nausea: no Palpitations:no Hyperventilation: no Panic attacks: no Agoraphobia: yes  Obscessions/compulsions: no Depressed mood: no    01/26/2023    8:51 AM 11/26/2022    9:08 AM 10/28/2022    2:57 PM 01/23/2022   11:34 AM 09/26/2021    8:58 AM  Depression screen PHQ 2/9  Decreased Interest 1 2 2 2 2   Down, Depressed, Hopeless 0 1 1 2 2   PHQ - 2 Score 1 3 3 4 4   Altered sleeping 1 2 2 1 3   Tired, decreased energy 2 3 3 2 3   Change in appetite 0 1 3 2  0  Feeling bad or failure about yourself  0 1 1 2 2   Trouble concentrating 2 3 3 3 3   Moving slowly or fidgety/restless 0 1 1 1 2   Suicidal thoughts 0 0 0 0 1  PHQ-9 Score 6 14 16 15 18   Difficult doing work/chores Somewhat difficult Very difficult Very difficult  Very difficult   Anhedonia: no Weight changes: no Insomnia: no   Hypersomnia: no Fatigue/loss of energy: yes Feelings of worthlessness: no Feelings of guilt:  no Impaired concentration/indecisiveness: no Suicidal ideations: no  Crying spells: no Recent Stressors/Life Changes: no   Relationship problems: no   Family stress: no     Financial stress: no    Job stress: no    Recent death/loss: no  Relevant past medical, surgical, family and social history reviewed and updated as indicated. Interim medical history since our last visit reviewed. Allergies and medications reviewed and updated.  Review of Systems  Constitutional: Negative.   Respiratory: Negative.    Cardiovascular: Negative.   Musculoskeletal: Negative.   Neurological: Negative.   Psychiatric/Behavioral: Negative.      Per HPI unless specifically indicated above     Objective:    BP 121/80   Pulse 84   Temp 98.5 F (36.9 C) (Oral)   Ht 5' 4.5" (1.638 m)   Wt 229 lb 6.4 oz (104.1 kg)   SpO2 99%   BMI 38.77 kg/m   Wt Readings from Last 3 Encounters:  01/26/23 229 lb 6.4 oz (104.1 kg)  11/26/22 233 lb 6.4 oz (105.9 kg)  10/28/22 230 lb 14.4 oz (104.7 kg)    Physical Exam Vitals and nursing note reviewed.  Constitutional:      General: She is not in acute distress.    Appearance: Normal appearance. She is obese. She is not ill-appearing, toxic-appearing  or diaphoretic.  HENT:     Head: Normocephalic and atraumatic.     Right Ear: External ear normal.     Left Ear: External ear normal.     Nose: Nose normal.     Mouth/Throat:     Mouth: Mucous membranes are moist.     Pharynx: Oropharynx is clear.  Eyes:     General: No scleral icterus.       Right eye: No discharge.        Left eye: No discharge.     Extraocular Movements: Extraocular movements intact.     Conjunctiva/sclera: Conjunctivae normal.     Pupils: Pupils are equal, round, and reactive to light.  Cardiovascular:     Rate and Rhythm: Normal rate and regular rhythm.     Pulses: Normal pulses.     Heart sounds: Normal heart sounds. No murmur heard.    No friction rub. No gallop.  Pulmonary:      Effort: Pulmonary effort is normal. No respiratory distress.     Breath sounds: Normal breath sounds. No stridor. No wheezing, rhonchi or rales.  Chest:     Chest wall: No tenderness.  Musculoskeletal:        General: Normal range of motion.     Cervical back: Normal range of motion and neck supple.  Skin:    General: Skin is warm and dry.     Capillary Refill: Capillary refill takes less than 2 seconds.     Coloration: Skin is not jaundiced or pale.     Findings: No bruising, erythema, lesion or rash.  Neurological:     General: No focal deficit present.     Mental Status: She is alert and oriented to person, place, and time. Mental status is at baseline.  Psychiatric:        Mood and Affect: Mood normal.        Behavior: Behavior normal.        Thought Content: Thought content normal.        Judgment: Judgment normal.     Results for orders placed or performed in visit on 12/24/22  HM PAP SMEAR  Result Value Ref Range   HM Pap smear See Report Attached in Chart       Assessment & Plan:   Problem List Items Addressed This Visit       Cardiovascular and Mediastinum   Essential hypertension - Primary    Under good control on current regimen. Continue current regimen. Continue to monitor. Call with any concerns. Refills given. Labs drawn today.       Relevant Medications   lisinopril (ZESTRIL) 5 MG tablet   Other Relevant Orders   Basic metabolic panel     Other   Generalized anxiety disorder with panic attacks    Doing pretty well not on her trintellix and wellbutrin. Continue seroquel, can take additional dose as needed during the day to help with agoraphobia. Call with any concerns. Continue to monitor.       Severe recurrent major depression without psychotic features    Doing pretty well not on her trintellix and wellbutrin. Continue seroquel, can take additional dose as needed during the day to help with agoraphobia. Call with any concerns. Continue to  monitor.         Follow up plan: Return in about 6 months (around 07/28/2023) for physical.

## 2023-01-26 NOTE — Assessment & Plan Note (Signed)
Doing pretty well not on her trintellix and wellbutrin. Continue seroquel, can take additional dose as needed during the day to help with agoraphobia. Call with any concerns. Continue to monitor.

## 2023-01-27 LAB — BASIC METABOLIC PANEL
BUN/Creatinine Ratio: 10 (ref 9–23)
BUN: 8 mg/dL (ref 6–24)
CO2: 21 mmol/L (ref 20–29)
Calcium: 9.3 mg/dL (ref 8.7–10.2)
Chloride: 106 mmol/L (ref 96–106)
Creatinine, Ser: 0.82 mg/dL (ref 0.57–1.00)
Glucose: 96 mg/dL (ref 70–99)
Potassium: 4.1 mmol/L (ref 3.5–5.2)
Sodium: 140 mmol/L (ref 134–144)
eGFR: 92 mL/min/{1.73_m2} (ref >59)

## 2023-01-28 ENCOUNTER — Encounter: Payer: Self-pay | Admitting: Family Medicine

## 2023-02-02 ENCOUNTER — Encounter: Payer: Self-pay | Admitting: Family Medicine

## 2023-02-16 ENCOUNTER — Ambulatory Visit: Payer: Medicaid Other | Admitting: Gastroenterology

## 2023-02-16 ENCOUNTER — Encounter: Payer: Self-pay | Admitting: Gastroenterology

## 2023-02-16 VITALS — BP 124/78 | HR 80 | Temp 98.7°F | Ht 64.5 in | Wt 227.2 lb

## 2023-02-16 DIAGNOSIS — K50012 Crohn's disease of small intestine with intestinal obstruction: Secondary | ICD-10-CM

## 2023-02-16 NOTE — Progress Notes (Signed)
Arlyss Repress, MD [address]  [address]  [place], [place] [address]  Main: [phone]  Fax: [phone]    Gastroenterology Consultation  Referring Provider:     Dorcas Carrow, DO Primary Care Physician:  Dorcas Carrow, DO Primary Gastroenterologist:  Dr. Arlyss Repress Reason for Consultation: Ileocolonic Crohn's disease        HPI:   Cheryl Flowers is a 42 y.o. female referred by Dr. Laural Benes, Oralia Rud, DO  for consultation & management of small bowel Crohn's  Patient is here for his postoperative management of Crohn's disease.  During last visit, I started patient on Rinvoq.  She started with 45 mg induction dose in August 2023 and in about 4 weeks, she started experiencing symptoms of headache, body aches, had bad cold and chronic cough.  When she went to see physiatry, she was informed that her blood pressure was elevated with systolic in 170s.  She was concerned if her symptoms are secondary to Rinvoq and self discontinued it.  She did not inform me about her symptoms and discontinuation of Rinvoq.  Today, she is here for follow-up.  Patient has not informed her PCP regarding high blood pressure.  She is currently on metoprolol.  She reports that since discontinuation of Rinvoq, she felt significantly better.  Currently, denies any headaches, denies any cold symptoms or cough.  Her blood pressure in the office today was 139/85.  She reports intermittent constipation otherwise denies any GI symptoms.  Her weight has been stable.  Although she gained weight since TI resection.  Follow-up visit 02/16/2023  Patient is here for follow-up of small bowel Crohn's.  She has started Humira in January, completed induction, currently on biweekly monotherapy.  Patient noticed that she has been having mild headaches sometimes on the day of Humira injection or few days after.  She had severe headache on Saturday, took Tylenol and Motrin.  Usually, her headaches are mild and  not associated with any other symptoms.  She is also noticing stiffness in her hands that she used to experience in the past when she was on Humira.  She denies any GI symptoms.  She admits to not following healthy diet.   Crohn's disease classification:   Age: 35 to 89 Location: Ileocolonic Behavior: stricturing Perianal: no   IBD diagnosis: Ileocolonic Crohn's disease, stricturing phenotype diagnosed based on the index colonoscopy in May 2021   Disease course:Patient reports that she has been longstanding symptoms of blood in the stool, right lower quadrant pain, abdominal distention, diagnosed with ileocolonic Crohn's disease with a terminal ileal stricture based on the colonoscopy in May 2021.  She was initially on budesonide for about a month, later on prednisone followed by initiation of Humira in July 2021.  Repeat colonoscopy revealed persistent stricture of the terminal ileum and therefore Humira was stopped and switched to Entyvio.  Prior to stopping Humira, patient was started on combination therapy, Imuran was added and was managed by her rheumatologist due to diagnosis of sacroiliitis.  Patient developed nausea, headache on Imuran and therefore discontinued it.  Patient was then switched to [facility] since June 2022, she was concerned about mood changes if these are associated with [facility], therefore this was stopped and switched to Humira again since January 2023.  Patient underwent colonoscopy every 6 to 8 months to assess response to biologic, she did have persistent TI stricture, however, MR enterography since her diagnosis showed mild wall thickening with mucosal enhancement involving  the distal ileum and cecum including the ileocecal valve.  She underwent 3 MR enterography studies to date which revealed consistent findings as above.  Patient was on Humira monotherapy every other week until she underwent a laparoscopic ileocolonic resection on 01/29/2022 by Dr. Milbert Coulter at [facility], 6.6 cm of the  TI and 6.3 cm of the colon were removed with primary anastomosis.  Patient's postop recovery was uneventful.  EGD and colonoscopy in 03/2022 did not reveal any postop recurrence of Crohn's disease. Patient tried Rinvoq 45 mg daily for 1 month in October and self discontinued due to symptoms of headache, high blood pressure and chronic cough.   Fecal calprotectin levels was 227 in 07/2020, improved to 289 in 09/2020, 37 in 4/22. CRP has been normal. Humira restarted in January 2024 with induction followed by maintenance biweekly  Extra intestinal manifestations: Sacroiliitis   IBD surgical history: laparoscopic ileocolonic resection on 01/29/2022 by Dr. Milbert Coulter at [facility], 6.6 cm of the TI and 6.3 cm of the colon were removed with primary anastomosis.   Imaging:   MRE   04/02/2020 IMPRESSION: 1. Mild wall thickening and mucosal enhancement involving the distal ileum and cecum. This is consistent with Crohn's disease. 2. No evidence of abscess or other complication. 3. Small uterine fibroids. 4. Uterine fibroids.   02/17/2021 IMPRESSION: 1. Short segment of terminal ileum with mild wall thickening and mucosal enhancement noted at the level of the ileal cecal valve. There is also mild wall thickening and enhancement involving the cecum. Findings consistent with Crohn's inflammation. No complications identified. Specifically, there is no abscess, obstruction or sign of penetrating disease. No evidence for penetrating disease or abscess. 2. Mildly increased caliber and enhancement of the proximal appendix which tapers to a normal caliber mid and distal appendix. Favor secondary inflammation. 3. Uterine fibroids.   12/30/2021 IMPRESSION: No significant change in short-segment wall thickening and enhancement involving the terminal ileum and ileocecal valve, consistent with history of Crohn disease. No evidence of penetrating disease or other complication.   CTE none   SBFT none    Procedures:   Index colonoscopy 03/09/2020 - Mucosal ulceration. Biopsied. - Patchy moderate inflammation was found in the cecum, rule out Crohn's disease. Biopsied. - Patchy mild inflammation was found in the transverse colon and in the ascending colon, rule out Crohn's disease. Biopsied. - The descending colon is normal. - Erythematous mucosa in the rectum and in the sigmoid colon. Biopsied. - The distal rectum and anal verge are normal on retroflexion view. DIAGNOSIS:  A. COLON, CECUM; COLD BIOPSY:  - CHRONIC COLITIS WITH FOCAL MILD ACTIVITY.  - NEGATIVE FOR DYSPLASIA AND MALIGNANCY.   B. ILEOCECAL VALVE; COLD BIOPSY:  - CHRONIC ENTERITIS / COLITIS WITH MODERATE ACTIVITY AND ULCERATION.  - NEGATIVE FOR DYSPLASIA AND MALIGNANCY.   C. COLON, ASCENDING; COLD BIOPSY:  - CHRONIC COLITIS WITH MODERATE ACTIVITY.  - NEGATIVE FOR DYSPLASIA AND MALIGNANCY.   D. COLON, TRANSVERSE; COLD BIOPSY:  - CHRONIC COLITIS WITH MODERATE ACTIVITY.  - NEGATIVE FOR DYSPLASIA AND MALIGNANCY.   E. COLON, DESCENDING; COLD BIOPSY:  - CHRONIC INACTIVE COLITIS.  - NEGATIVE FOR DYSPLASIA AND MALIGNANCY.   F. COLON, RECTOSIGMOID; COLD BIOPSY:  - CHRONIC INACTIVE COLITIS.  - NEGATIVE FOR DYSPLASIA AND MALIGNANCY.      Colonoscopy 07/27/2020 - The examined portion of the ileum was normal. Biopsied. - Crohn's disease. Inflammation was found in the transverse colon, in the ascending colon and at the cecum. This was moderate in severity, worsened compared to  previous examinations. Biopsied. - The distal rectum and anal verge are normal on retroflexion view. DIAGNOSIS:  A. COLON, RIGHT; COLD BIOPSY:  - MILD AND FOCALLY MODERATE CHRONIC ACTIVE COLITIS INVOLVING ALL BIOPSY  FRAGMENTS, CONSISTENT WITH PATIENT'S KNOWN HISTORY OF CROHNS.  - FOCAL NON-NECROTIZING GRANULOMAS; AFB AND PASF STAINS ARE NEGATIVE;  STAIN CONTROLS WORKED APPROPRIATELY.  - NEGATIVE FOR DYSPLASIA AND MALIGNANCY.   B.  TERMINAL ILEUM;  COLD BIOPSY:  - UNREMARKABLE SMALL INTESTINAL MUCOSA.  - NEGATIVE FOR ACTIVE ENTERITIS, FEATURES OF CHRONICITY, DYSPLASIA, AND  MALIGNANCY.   C.  COLON, LEFT; COLD BIOPSY:  - CHRONIC INACTIVE COLITIS INVOLVING A MINORITY OF THE BIOPSY FRAGMENTS,  CONSISTENT WITH PATIENT'S KNOWN HISTORY OF CROHNS.  - NEGATIVE FOR DYSPLASIA AND MALIGNANCY.    Colonoscopy 03/27/2021 - Stricture at the ileocecal valve. Biopsied. - Decreased mucosa vascular pattern in the cecum. Biopsied. - One 5 mm polyp in the sigmoid colon, removed with a cold snare. Resected and retrieved. - The rectum, sigmoid colon, descending colon, transverse colon and ascending colon are normal. Biopsied. DIAGNOSIS:  A. TERMINAL ILEUM; COLD BIOPSY:  - MILD CHRONIC ACTIVE ENTERITIS CONSISTENT WITH PATIENT'S HISTORY OF  CROHN'S DISEASE.  - NEGATIVE FOR DYSPLASIA AND MALIGNANCY.   B.  COLON, CECUM AND ASCENDING; COLD BIOPSY:  - MILD CHRONIC ACTIVE COLITIS CONSISTENT WITH PATIENT'S KNOWN HISTORY OF  CROHN'S.  - NEGATIVE FOR DYSPLASIA AND MALIGNANCY.   C.  COLON, TRANSVERSE; COLD BIOPSY:  - PATCHY CHRONIC INACTIVE COLITIS CONSISTENT WITH PATIENT'S HISTORY OF  CROHN'S.  - NEGATIVE FOR DYSPLASIA AND MALIGNANCY.   D.  COLON, DESCENDING; COLD BIOPSY:  - PATCHY CHRONIC INACTIVE COLITIS CONSISTENT WITH PATIENT'S HISTORY OF  CROHN'S.  - NEGATIVE FOR DYSPLASIA AND MALIGNANCY.   E.  COLON, RECTOSIGMOID; COLD BIOPSY:  - PATCHY CHRONIC INACTIVE COLITIS CONSISTENT WITH PATIENT'S HISTORY OF  CROHN'S.  - NEGATIVE FOR DYSPLASIA AND MALIGNANCY.    F.  COLON POLYP, SIGMOID; COLD SNARE:  - HYPERPLASTIC POLYP.  - NEGATIVE FOR DYSPLASIA AND MALIGNANCY.    Colonoscopy 08/28/2021 - Stricture in the terminal ileum. Biopsied. - Altered vascular mucosa in the ascending colon and in the cecum. Biopsied. - The rectum, sigmoid colon, descending colon and transverse colon are normal. Biopsied. DIAGNOSIS:  A. TERMINAL ILEUM; COLD BIOPSY:  - CHRONIC  ILEITIS WITH MILD ACTIVITY.  - NEGATIVE FOR GRANULOMA, DYSPLASIA, AND MALIGNANCY.   B. COLON, ASCENDING AND CECUM; COLD BIOPSY:  - CHRONIC COLITIS WITH MINIMAL ACTIVITY (FOCAL SUPERFICIAL CRYPTITIS).  - NEGATIVE FOR GRANULOMA, DYSPLASIA, AND MALIGNANCY.   C. COLON, TRANSVERSE; COLD BIOPSY:  - CHRONIC COLITIS WITHOUT ACTIVITY.  - NEGATIVE FOR GRANULOMA, DYSPLASIA, AND MALIGNANCY.   D. COLON, DESCENDING; COLD BIOPSY:  - CHRONIC COLITIS WITHOUT ACTIVITY.  - NEGATIVE FOR GRANULOMA, DYSPLASIA, AND MALIGNANCY.   E. COLON, RECTOSIGMOID; COLD BIOPSY:  - CHRONIC COLITIS WITHOUT ACTIVITY.  - NEGATIVE FOR GRANULOMA, DYSPLASIA, AND MALIGNANCY.   Colonoscopy 04/22/2022 - Patent end-to-side ileo-colonic anastomosis, characterized by healthy appearing mucosa. - The entire examined colon is normal. - The distal rectum and anal verge are normal on retroflexion view. - The examined portion of the ileum was normal. - No specimens collected.  Upper Endoscopy 04/22/2022 - Normal esophagus. - Normal stomach. - Normal examined duodenum. - No specimens collected.    VCE none   IBD medications:   Steroids: Prednisone, budesonide 5-ASA: None Immunomodulators: AZA, methotrexate TPMT status: normal Biologics:  Anti TNFs: Humira July 2021 to June 2022, restarted in January 2023, stopped in 12/2021  prior to ileocolonic resection Restarted Humira in 10/2022 Anti Integrins: Entyvio from June 2022 to October 2022, stopped secondary to mood changes Ustekinumab: Tofactinib: Upadacitinib: Initiated in August 2023, patient self discontinued after 4 weeks of taking Rinvoq due to high blood pressure, cough and headaches Clinical trial:   NSAIDs: None   Antiplts/Anticoagulants/Anti thrombotics: None  Past Medical History:  Diagnosis Date   Anxiety    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   Asthma    Crohn's disease    Depression    GERD (gastroesophageal reflux disease)    Joint pain     secondary to Crohn's   Low back pain    Marital conflict    Migraine headache    approx 2x/month   Panic disorder    Thyromegaly    Tobacco abuse 05/12/2018   Wears dentures    partial upper    Past Surgical History:  Procedure Laterality Date   CESAREAN SECTION  2003 and 2010   CHOLECYSTECTOMY  2007   COLONOSCOPY WITH PROPOFOL N/A 03/07/2020   Procedure: COLONOSCOPY WITH PROPOFOL;  Surgeon: Pasty Spillers, MD;  Location: [facility];  Service: Endoscopy;  Laterality: N/A;   COLONOSCOPY WITH PROPOFOL N/A 07/27/2020   Procedure: COLONOSCOPY WITH PROPOFOL;  Surgeon: Pasty Spillers, MD;  Location: [facility];  Service: Endoscopy;  Laterality: N/A;   COLONOSCOPY WITH PROPOFOL N/A 03/27/2021   Procedure: COLONOSCOPY WITH PROPOFOL;  Surgeon: Pasty Spillers, MD;  Location: [facility];  Service: Endoscopy;  Laterality: N/A;   COLONOSCOPY WITH PROPOFOL N/A 08/28/2021   Procedure: COLONOSCOPY WITH PROPOFOL;  Surgeon: Pasty Spillers, MD;  Location: [facility];  Service: Endoscopy;  Laterality: N/A;   COLONOSCOPY WITH PROPOFOL N/A 04/22/2022   Procedure: COLONOSCOPY WITH PROPOFOL;  Surgeon: Toney Reil, MD;  Location[facility];  Service: Endoscopy;  Laterality: N/A;   ESOPHAGOGASTRODUODENOSCOPY (EGD) WITH PROPOFOL N/A 04/22/2022   Procedure: ESOPHAGOGASTRODUODENOSCOPY (EGD) WITH PROPOFOL;  Surgeon: Toney Reil, MD;  Location: [facility];  Service: Endoscopy;  Laterality: N/A;   TUBAL LIGATION  2010     Current Outpatient Medications:    acetaminophen (TYLENOL) 500 MG tablet, Take 1-2 tablets (500 mg - 1 g total) by mouth every 6 hours as needed for pain. Prescription not provided, medication is available over the counter. Take as directed on label, Disp: , Rfl:    Albuterol Sulfate, sensor, 108 (90 Base) MCG/ACT AEPB, , Disp: , Rfl:    HUMIRA, 2 PEN, 40 MG/0.4ML PNKT, SMARTSIG:40 Milligram(s) SUB-Q Every 2 Weeks, Disp: , Rfl:     lisinopril (ZESTRIL) 5 MG tablet, Take 1 tablet (5 mg total) by mouth daily., Disp: 90 tablet, Rfl: 1   norethindrone (AYGESTIN) 5 MG tablet, Take 1 tablet by mouth daily., Disp: , Rfl:    QUEtiapine (SEROQUEL) 25 MG tablet, Take 0.5-1 tablets (12.5-25 mg total) by mouth 2 (two) times daily., Disp: 180 tablet, Rfl: 1   Family History  Problem Relation Age of Onset   Kidney disease Mother    Hypertension Mother    Rheum arthritis Mother    Ankylosing spondylitis Mother    Hypertension Father    Diabetes Father    Breast cancer Maternal Aunt        early 25   Crohn's disease Maternal Aunt    Lupus Maternal Aunt    Heart attack Maternal Grandmother    Stroke Maternal Grandfather      Social History   Tobacco Use   Smoking  status: Former    Packs/day: 0.25    Years: 19.00    Additional pack years: 0.00    Total pack years: 4.75    Types: Cigarettes    Quit date: 01/25/2022    Years since quitting: 1.0   Smokeless tobacco: Never  Vaping Use   Vaping Use: Former  Substance Use Topics   Alcohol use: Not Currently   Drug use: No    Allergies as of 02/16/2023 - Review Complete 02/16/2023  Allergen Reaction Noted   Morphine and related Itching 12/15/2015   Sulfa antibiotics Hives 12/15/2015    Review of Systems:    All systems reviewed and negative except where noted in HPI.   Physical Exam:  BP 124/78 (BP Location: Left Arm, Patient Position: Sitting, Cuff Size: Normal)   Pulse 80   Temp 98.7 F (37.1 C) (Oral)   Ht 5' 4.5" (1.638 m)   Wt 227 lb 4 oz (103.1 kg)   BMI 38.40 kg/m  No LMP recorded. (Menstrual status: Oral contraceptives).  General:   Alert,  Well-developed, well-nourished, pleasant and cooperative in NAD Head:  Normocephalic and atraumatic. Eyes:  Sclera clear, no icterus.   Conjunctiva pink. Ears:  Normal auditory acuity. Nose:  No deformity, discharge, or lesions. Mouth:  No deformity or lesions,oropharynx pink & moist. Neck:  Supple; no  masses or thyromegaly. Lungs:  Respirations even and unlabored.  Clear throughout to auscultation.   No wheezes, crackles, or rhonchi. No acute distress. Heart:  Regular rate and rhythm; no murmurs, clicks, rubs, or gallops. Abdomen:  Normal bowel sounds. Soft, non-tender and nondistended without masses, hepatosplenomegaly or hernias noted.  No guarding or rebound tenderness.   Rectal: Not performed Msk:  Symmetrical without gross deformities. Good, equal movement & strength bilaterally. Pulses:  Normal pulses noted. Extremities:  No clubbing or edema.  No cyanosis. Neurologic:  Alert and oriented x3;  grossly normal neurologically. Skin:  Intact without significant lesions or rashes. No jaundice. Psych:  Alert and cooperative. Normal mood and affect.  Imaging Studies: Reviewed  Assessment and Plan:   ROSALEEN MAZER is a 42 y.o. African-American female with history of ileocolonic Crohn's disease, stricturing phenotype diagnosed in May 2021, treated with budesonide, prednisone followed by Humira and temporarily on combination therapy with Imuran, later switched to [facility], switched back to Humira s/p laparoscopic ileocolic resection on 01/31/2022, with primary anastomosis.  Ileocolonic Crohn's disease with stricturing phenotype: S/p ileocolic resection in 01/2022 Colonoscopy in 03/2022 revealed no postop recurrence of Crohn's disease Humira reinitiated in January 2024, experiencing mild intermittent headaches and arthralgias.  Patient had neurologic workup by [facility] neurologist, Dr. Sherryll Burger in the past for headaches, no evidence of multiple sclerosis based on MRI in 01/2018.  She has not seen the neurologist since 11/2020.  Her headaches were thought to be secondary to intractable migraine.  EEG was also normal in 12/2020 Recommend to check Humira trough levels and antibodies.  If her symptoms are persistent and worsening, will discontinue Humira.  Second line therapy would be most likely  antiinterleukin agents Recheck CBC, CMP and CRP today  IBD Health Maintenance   1.TB status: QuantiFERON gold in 09/2021 negative 2. Anemia: None 3.Immunizations: Hep A antibody negative and B immune/reactive, Influenza recommend annual influenza vaccine, prevnar received, pneumovax received, Varicella unknown, Zoster recommend Shingrix vaccine 4.Cancer screening I) Colon cancer/dysplasia surveillance: None II) Cervical cancer: Pap smear up-to-date III) Skin cancer - counseled about annual skin exam by dermatology and skin protection in summer using  sun screen SPF > 50, clothing 5.Bone health Vitamin D status: Mild vitamin D deficiency, continue vitamin D supplements Bone density testing: Not performed 5. Labs: Today and every 3 months 6. Smoking: None 7. NSAIDs and Antibiotics use: None     Follow up in 4 months   Arlyss Repress, MD

## 2023-02-16 NOTE — Patient Instructions (Signed)
our lab is open on Mondays 1pm to 4pm, Tuesdays 8:30am to 4pm. Wednesdays 8:30am to 4pm and thursdays 1pm to 4pm.   

## 2023-02-17 ENCOUNTER — Encounter: Payer: Self-pay | Admitting: Gastroenterology

## 2023-02-23 DIAGNOSIS — K50012 Crohn's disease of small intestine with intestinal obstruction: Secondary | ICD-10-CM | POA: Diagnosis not present

## 2023-02-26 ENCOUNTER — Encounter: Payer: Self-pay | Admitting: Gastroenterology

## 2023-03-21 LAB — STATUS REPORT

## 2023-03-25 ENCOUNTER — Telehealth: Payer: Self-pay

## 2023-03-25 DIAGNOSIS — K50012 Crohn's disease of small intestine with intestinal obstruction: Secondary | ICD-10-CM

## 2023-03-25 LAB — COMPREHENSIVE METABOLIC PANEL
ALT: 31 IU/L (ref 0–32)
AST: 19 IU/L (ref 0–40)
Albumin/Globulin Ratio: 1.5 (ref 1.2–2.2)
Albumin: 4.1 g/dL (ref 3.9–4.9)
Alkaline Phosphatase: 64 IU/L (ref 44–121)
BUN/Creatinine Ratio: 8 — ABNORMAL LOW (ref 9–23)
BUN: 7 mg/dL (ref 6–24)
Bilirubin Total: 0.2 mg/dL (ref 0.0–1.2)
CO2: 21 mmol/L (ref 20–29)
Calcium: 9 mg/dL (ref 8.7–10.2)
Chloride: 106 mmol/L (ref 96–106)
Creatinine, Ser: 0.84 mg/dL (ref 0.57–1.00)
Globulin, Total: 2.8 g/dL (ref 1.5–4.5)
Glucose: 81 mg/dL (ref 70–99)
Potassium: 4 mmol/L (ref 3.5–5.2)
Sodium: 142 mmol/L (ref 134–144)
Total Protein: 6.9 g/dL (ref 6.0–8.5)
eGFR: 89 mL/min/{1.73_m2} (ref >59)

## 2023-03-25 LAB — CBC
Hematocrit: 39.5 % (ref 34.0–46.6)
Hemoglobin: 12.9 g/dL (ref 11.1–15.9)
MCH: 30.6 pg (ref 26.6–33.0)
MCHC: 32.7 g/dL (ref 31.5–35.7)
MCV: 94 fL (ref 79–97)
Platelets: 335 10*3/uL (ref 150–450)
RBC: 4.22 x10E6/uL (ref 3.77–5.28)
RDW: 12.4 % (ref 11.7–15.4)
WBC: 8.3 10*3/uL (ref 3.4–10.8)

## 2023-03-25 LAB — C-REACTIVE PROTEIN: CRP: 8 mg/L (ref 0–10)

## 2023-03-25 LAB — ADALIMUMAB+AB (SERIAL MONITOR)

## 2023-03-25 NOTE — Telephone Encounter (Signed)
Called and left a message for call back  

## 2023-03-25 NOTE — Telephone Encounter (Signed)
-----   Message from Toney Reil, MD sent at 03/25/2023  4:24 PM EDT ----- Morrie Sheldon  Please inform patient that the Humira blood test has been canceled.  She has to go to the lab right before her next dose  RV

## 2023-03-26 NOTE — Telephone Encounter (Signed)
Patient verbalized understanding she will have lab done again right before her next does

## 2023-06-22 ENCOUNTER — Other Ambulatory Visit: Payer: Self-pay

## 2023-06-22 ENCOUNTER — Ambulatory Visit: Payer: Medicaid Other | Admitting: Gastroenterology

## 2023-06-22 ENCOUNTER — Encounter: Payer: Self-pay | Admitting: Gastroenterology

## 2023-06-22 VITALS — BP 118/75 | HR 82 | Temp 98.4°F | Ht 64.5 in | Wt 226.2 lb

## 2023-06-22 DIAGNOSIS — K50012 Crohn's disease of small intestine with intestinal obstruction: Secondary | ICD-10-CM

## 2023-06-22 DIAGNOSIS — E559 Vitamin D deficiency, unspecified: Secondary | ICD-10-CM | POA: Diagnosis not present

## 2023-06-22 MED ORDER — SHINGRIX 50 MCG/0.5ML IM SUSR: 0.5000 mL | Freq: Once | INTRAMUSCULAR | 0 refills | Status: AC

## 2023-06-22 NOTE — Progress Notes (Signed)
Arlyss Repress, MD [address]  [address]  [place], [place] [address]  Main: [phone]  Fax: [phone]    Gastroenterology Consultation  Referring Provider:     Dorcas Carrow, DO Primary Care Physician:  Cheryl Carrow, DO Primary Gastroenterologist:  Dr. Arlyss Repress Reason for Consultation: Ileocolonic Crohn's disease        HPI:   Cheryl Flowers is a 42 y.o. female referred by Dr. Laural Benes, Oralia Rud, DO  for consultation & management of small bowel Crohn's  Patient is here for his postoperative management of Crohn's disease.  During last visit, I started patient on Rinvoq.  She started with 45 mg induction dose in August 2023 and in about 4 weeks, she started experiencing symptoms of headache, body aches, had bad cold and chronic cough.  When she went to see physiatry, she was informed that her blood pressure was elevated with systolic in 170s.  She was concerned if her symptoms are secondary to Rinvoq and self discontinued it.  She did not inform me about her symptoms and discontinuation of Rinvoq.  Today, she is here for follow-up.  Patient has not informed her PCP regarding high blood pressure.  She is currently on metoprolol.  She reports that since discontinuation of Rinvoq, she felt significantly better.  Currently, denies any headaches, denies any cold symptoms or cough.  Her blood pressure in the office today was 139/85.  She reports intermittent constipation otherwise denies any GI symptoms.  Her weight has been stable.  Although she gained weight since TI resection.  Follow-up visit 02/16/2023  Patient is here for follow-up of small bowel Crohn's.  She has started Humira in January, completed induction, currently on biweekly monotherapy.  Patient noticed that she has been having mild headaches sometimes on the day of Humira injection or few days after.  She had severe headache on Saturday, took Tylenol and Motrin.  Usually, her headaches are mild and  not associated with any other symptoms.  She is also noticing stiffness in her hands that she used to experience in the past when she was on Humira.  She denies any GI symptoms.  She admits to not following healthy diet.  Follow-up visit 06/22/2023 Cheryl Flowers is here for follow-up of small bowel Crohn's.  She has been doing well on Humira, currently on Humira every 2 weeks.  She is trying to eat healthy, following her daughter to lead healthy living.  She is still working on making healthy choices.  She leads sedentary lifestyle.  She reports having a bowel movement every other day, sometimes feels constipated.  Metamucil helps significantly for her.   Crohn's disease classification:   Age: 47 to 2 Location: Ileocolonic Behavior: stricturing Perianal: no   IBD diagnosis: Ileocolonic Crohn's disease, stricturing phenotype diagnosed based on the index colonoscopy in May 2021   Disease course:Patient reports that she has been longstanding symptoms of blood in the stool, right lower quadrant pain, abdominal distention, diagnosed with ileocolonic Crohn's disease with a terminal ileal stricture based on the colonoscopy in May 2021.  She was initially on budesonide for about a month, later on prednisone followed by initiation of Humira in July 2021.  Repeat colonoscopy revealed persistent stricture of the terminal ileum and therefore Humira was stopped and switched to Entyvio.  Prior to stopping Humira, patient was started on combination therapy, Imuran was added and was managed by her rheumatologist due to diagnosis of sacroiliitis.  Patient developed nausea, headache  on Imuran and therefore discontinued it.  Patient was then switched to [facility] since June 2022, she was concerned about mood changes if these are associated with [facility], therefore this was stopped and switched to Humira again since January 2023.  Patient underwent colonoscopy every 6 to 8 months to assess response to biologic, she did have  persistent TI stricture, however, MR enterography since her diagnosis showed mild wall thickening with mucosal enhancement involving the distal ileum and cecum including the ileocecal valve.  She underwent 3 MR enterography studies to date which revealed consistent findings as above.  Patient was on Humira monotherapy every other week until she underwent a laparoscopic ileocolonic resection on 01/29/2022 by Dr. Milbert Coulter at [facility], 6.6 cm of the TI and 6.3 cm of the colon were removed with primary anastomosis.  Patient's postop recovery was uneventful.  EGD and colonoscopy in 03/2022 did not reveal any postop recurrence of Crohn's disease. Patient tried Rinvoq 45 mg daily for 1 month in October and self discontinued due to symptoms of headache, high blood pressure and chronic cough.   Fecal calprotectin levels was 227 in 07/2020, improved to 289 in 09/2020, 37 in 4/22. CRP has been normal. Humira restarted in January 2024 with induction followed by maintenance biweekly  Extra intestinal manifestations: Sacroiliitis   IBD surgical history: laparoscopic ileocolonic resection on 01/29/2022 by Dr. Milbert Coulter at [facility], 6.6 cm of the TI and 6.3 cm of the colon were removed with primary anastomosis.   Imaging:   MRE   04/02/2020 IMPRESSION: 1. Mild wall thickening and mucosal enhancement involving the distal ileum and cecum. This is consistent with Crohn's disease. 2. No evidence of abscess or other complication. 3. Small uterine fibroids. 4. Uterine fibroids.   02/17/2021 IMPRESSION: 1. Short segment of terminal ileum with mild wall thickening and mucosal enhancement noted at the level of the ileal cecal valve. There is also mild wall thickening and enhancement involving the cecum. Findings consistent with Crohn's inflammation. No complications identified. Specifically, there is no abscess, obstruction or sign of penetrating disease. No evidence for penetrating disease or abscess. 2. Mildly increased caliber  and enhancement of the proximal appendix which tapers to a normal caliber mid and distal appendix. Favor secondary inflammation. 3. Uterine fibroids.   12/30/2021 IMPRESSION: No significant change in short-segment wall thickening and enhancement involving the terminal ileum and ileocecal valve, consistent with history of Crohn disease. No evidence of penetrating disease or other complication.   CTE none   SBFT none   Procedures:   Index colonoscopy 03/09/2020 - Mucosal ulceration. Biopsied. - Patchy moderate inflammation was found in the cecum, rule out Crohn's disease. Biopsied. - Patchy mild inflammation was found in the transverse colon and in the ascending colon, rule out Crohn's disease. Biopsied. - The descending colon is normal. - Erythematous mucosa in the rectum and in the sigmoid colon. Biopsied. - The distal rectum and anal verge are normal on retroflexion view. DIAGNOSIS:  A. COLON, CECUM; COLD BIOPSY:  - CHRONIC COLITIS WITH FOCAL MILD ACTIVITY.  - NEGATIVE FOR DYSPLASIA AND MALIGNANCY.   B. ILEOCECAL VALVE; COLD BIOPSY:  - CHRONIC ENTERITIS / COLITIS WITH MODERATE ACTIVITY AND ULCERATION.  - NEGATIVE FOR DYSPLASIA AND MALIGNANCY.   C. COLON, ASCENDING; COLD BIOPSY:  - CHRONIC COLITIS WITH MODERATE ACTIVITY.  - NEGATIVE FOR DYSPLASIA AND MALIGNANCY.   D. COLON, TRANSVERSE; COLD BIOPSY:  - CHRONIC COLITIS WITH MODERATE ACTIVITY.  - NEGATIVE FOR DYSPLASIA AND MALIGNANCY.   E. COLON, DESCENDING; COLD BIOPSY:  -  CHRONIC INACTIVE COLITIS.  - NEGATIVE FOR DYSPLASIA AND MALIGNANCY.   F. COLON, RECTOSIGMOID; COLD BIOPSY:  - CHRONIC INACTIVE COLITIS.  - NEGATIVE FOR DYSPLASIA AND MALIGNANCY.      Colonoscopy 07/27/2020 - The examined portion of the ileum was normal. Biopsied. - Crohn's disease. Inflammation was found in the transverse colon, in the ascending colon and at the cecum. This was moderate in severity, worsened compared to previous examinations.  Biopsied. - The distal rectum and anal verge are normal on retroflexion view. DIAGNOSIS:  A. COLON, RIGHT; COLD BIOPSY:  - MILD AND FOCALLY MODERATE CHRONIC ACTIVE COLITIS INVOLVING ALL BIOPSY  FRAGMENTS, CONSISTENT WITH PATIENT'S KNOWN HISTORY OF CROHNS.  - FOCAL NON-NECROTIZING GRANULOMAS; AFB AND PASF STAINS ARE NEGATIVE;  STAIN CONTROLS WORKED APPROPRIATELY.  - NEGATIVE FOR DYSPLASIA AND MALIGNANCY.   B.  TERMINAL ILEUM; COLD BIOPSY:  - UNREMARKABLE SMALL INTESTINAL MUCOSA.  - NEGATIVE FOR ACTIVE ENTERITIS, FEATURES OF CHRONICITY, DYSPLASIA, AND  MALIGNANCY.   C.  COLON, LEFT; COLD BIOPSY:  - CHRONIC INACTIVE COLITIS INVOLVING A MINORITY OF THE BIOPSY FRAGMENTS,  CONSISTENT WITH PATIENT'S KNOWN HISTORY OF CROHNS.  - NEGATIVE FOR DYSPLASIA AND MALIGNANCY.    Colonoscopy 03/27/2021 - Stricture at the ileocecal valve. Biopsied. - Decreased mucosa vascular pattern in the cecum. Biopsied. - One 5 mm polyp in the sigmoid colon, removed with a cold snare. Resected and retrieved. - The rectum, sigmoid colon, descending colon, transverse colon and ascending colon are normal. Biopsied. DIAGNOSIS:  A. TERMINAL ILEUM; COLD BIOPSY:  - MILD CHRONIC ACTIVE ENTERITIS CONSISTENT WITH PATIENT'S HISTORY OF  CROHN'S DISEASE.  - NEGATIVE FOR DYSPLASIA AND MALIGNANCY.   B.  COLON, CECUM AND ASCENDING; COLD BIOPSY:  - MILD CHRONIC ACTIVE COLITIS CONSISTENT WITH PATIENT'S KNOWN HISTORY OF  CROHN'S.  - NEGATIVE FOR DYSPLASIA AND MALIGNANCY.   C.  COLON, TRANSVERSE; COLD BIOPSY:  - PATCHY CHRONIC INACTIVE COLITIS CONSISTENT WITH PATIENT'S HISTORY OF  CROHN'S.  - NEGATIVE FOR DYSPLASIA AND MALIGNANCY.   D.  COLON, DESCENDING; COLD BIOPSY:  - PATCHY CHRONIC INACTIVE COLITIS CONSISTENT WITH PATIENT'S HISTORY OF  CROHN'S.  - NEGATIVE FOR DYSPLASIA AND MALIGNANCY.   E.  COLON, RECTOSIGMOID; COLD BIOPSY:  - PATCHY CHRONIC INACTIVE COLITIS CONSISTENT WITH PATIENT'S HISTORY OF  CROHN'S.  - NEGATIVE  FOR DYSPLASIA AND MALIGNANCY.    F.  COLON POLYP, SIGMOID; COLD SNARE:  - HYPERPLASTIC POLYP.  - NEGATIVE FOR DYSPLASIA AND MALIGNANCY.    Colonoscopy 08/28/2021 - Stricture in the terminal ileum. Biopsied. - Altered vascular mucosa in the ascending colon and in the cecum. Biopsied. - The rectum, sigmoid colon, descending colon and transverse colon are normal. Biopsied. DIAGNOSIS:  A. TERMINAL ILEUM; COLD BIOPSY:  - CHRONIC ILEITIS WITH MILD ACTIVITY.  - NEGATIVE FOR GRANULOMA, DYSPLASIA, AND MALIGNANCY.   B. COLON, ASCENDING AND CECUM; COLD BIOPSY:  - CHRONIC COLITIS WITH MINIMAL ACTIVITY (FOCAL SUPERFICIAL CRYPTITIS).  - NEGATIVE FOR GRANULOMA, DYSPLASIA, AND MALIGNANCY.   C. COLON, TRANSVERSE; COLD BIOPSY:  - CHRONIC COLITIS WITHOUT ACTIVITY.  - NEGATIVE FOR GRANULOMA, DYSPLASIA, AND MALIGNANCY.   D. COLON, DESCENDING; COLD BIOPSY:  - CHRONIC COLITIS WITHOUT ACTIVITY.  - NEGATIVE FOR GRANULOMA, DYSPLASIA, AND MALIGNANCY.   E. COLON, RECTOSIGMOID; COLD BIOPSY:  - CHRONIC COLITIS WITHOUT ACTIVITY.  - NEGATIVE FOR GRANULOMA, DYSPLASIA, AND MALIGNANCY.   Colonoscopy 04/22/2022 - Patent end-to-side ileo-colonic anastomosis, characterized by healthy appearing mucosa. - The entire examined colon is normal. - The distal rectum and anal verge are normal on  retroflexion view. - The examined portion of the ileum was normal. - No specimens collected.  Upper Endoscopy 04/22/2022 - Normal esophagus. - Normal stomach. - Normal examined duodenum. - No specimens collected.    VCE none   IBD medications:   Steroids: Prednisone, budesonide 5-ASA: None Immunomodulators: AZA, methotrexate TPMT status: normal Biologics:  Anti TNFs: Humira July 2021 to June 2022, restarted in January 2023, stopped in 12/2021 prior to ileocolonic resection Restarted Humira in 10/2022 Anti Integrins: Entyvio from June 2022 to October 2022, stopped secondary to mood  changes Ustekinumab: Tofactinib: Upadacitinib: Initiated in August 2023, patient self discontinued after 4 weeks of taking Rinvoq due to high blood pressure, cough and headaches Clinical trial:   NSAIDs: None   Antiplts/Anticoagulants/Anti thrombotics: None  Past Medical History:  Diagnosis Date   Anxiety    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   Asthma    Crohn's disease (HCC)    Depression    GERD (gastroesophageal reflux disease)    Joint pain    secondary to Crohn's   Low back pain    Marital conflict    Migraine headache    approx 2x/month   Panic disorder    Thyromegaly    Tobacco abuse 05/12/2018   Wears dentures    partial upper    Past Surgical History:  Procedure Laterality Date   CESAREAN SECTION  2003 and 2010   CHOLECYSTECTOMY  2007   COLONOSCOPY WITH PROPOFOL N/A 03/07/2020   Procedure: COLONOSCOPY WITH PROPOFOL;  Surgeon: Pasty Spillers, MD;  Location: [facility];  Service: Endoscopy;  Laterality: N/A;   COLONOSCOPY WITH PROPOFOL N/A 07/27/2020   Procedure: COLONOSCOPY WITH PROPOFOL;  Surgeon: Pasty Spillers, MD;  Location: [facility];  Service: Endoscopy;  Laterality: N/A;   COLONOSCOPY WITH PROPOFOL N/A 03/27/2021   Procedure: COLONOSCOPY WITH PROPOFOL;  Surgeon: Pasty Spillers, MD;  Location: [facility];  Service: Endoscopy;  Laterality: N/A;   COLONOSCOPY WITH PROPOFOL N/A 08/28/2021   Procedure: COLONOSCOPY WITH PROPOFOL;  Surgeon: Pasty Spillers, MD;  Location: [facility];  Service: Endoscopy;  Laterality: N/A;   COLONOSCOPY WITH PROPOFOL N/A 04/22/2022   Procedure: COLONOSCOPY WITH PROPOFOL;  Surgeon: Toney Reil, MD;  Location: [facility];  Service: Endoscopy;  Laterality: N/A;   ESOPHAGOGASTRODUODENOSCOPY (EGD) WITH PROPOFOL N/A 04/22/2022   Procedure: ESOPHAGOGASTRODUODENOSCOPY (EGD) WITH PROPOFOL;  Surgeon: Toney Reil, MD;  Location: [facility];  Service: Endoscopy;   Laterality: N/A;   TUBAL LIGATION  2010     Current Outpatient Medications:    acetaminophen (TYLENOL) 500 MG tablet, Take 1-2 tablets (500 mg - 1 g total) by mouth every 6 hours as needed for pain. Prescription not provided, medication is available over the counter. Take as directed on label, Disp: , Rfl:    HUMIRA, 2 PEN, 40 MG/0.4ML PNKT, SMARTSIG:40 Milligram(s) SUB-Q Every 2 Weeks, Disp: , Rfl:    lisinopril (ZESTRIL) 5 MG tablet, Take 1 tablet (5 mg total) by mouth daily., Disp: 90 tablet, Rfl: 1   norethindrone (AYGESTIN) 5 MG tablet, Take 1 tablet by mouth daily., Disp: , Rfl:    QUEtiapine (SEROQUEL) 25 MG tablet, Take 0.5-1 tablets (12.5-25 mg total) by mouth 2 (two) times daily., Disp: 180 tablet, Rfl: 1   Zoster Vaccine Adjuvanted (SHINGRIX) injection, Inject 0.5 mLs into the muscle once for 1 dose., Disp: 1 each, Rfl: 0   Family History  Problem Relation Age of Onset   Kidney disease Mother  Hypertension Mother    Rheum arthritis Mother    Ankylosing spondylitis Mother    Hypertension Father    Diabetes Father    Breast cancer Maternal Aunt        early 13   Crohn's disease Maternal Aunt    Lupus Maternal Aunt    Heart attack Maternal Grandmother    Stroke Maternal Grandfather      Social History   Tobacco Use   Smoking status: Former    Current packs/day: 0.00    Average packs/day: 0.3 packs/day for 19.0 years (4.8 ttl pk-yrs)    Types: Cigarettes    Start date: 01/26/2003    Quit date: 01/25/2022    Years since quitting: 1.4   Smokeless tobacco: Never  Vaping Use   Vaping status: Former  Substance Use Topics   Alcohol use: Not Currently   Drug use: No    Allergies as of 06/22/2023 - Review Complete 02/16/2023  Allergen Reaction Noted   Morphine and codeine Itching 12/15/2015   Sulfa antibiotics Hives 12/15/2015    Review of Systems:    All systems reviewed and negative except where noted in HPI.   Physical Exam:  BP 118/75 (BP Location: Left Arm,  Patient Position: Sitting, Cuff Size: Large)   Pulse 82   Temp 98.4 F (36.9 C) (Oral)   Ht 5' 4.5" (1.638 m)   Wt 226 lb 4 oz (102.6 kg)   BMI 38.24 kg/m  No LMP recorded. (Menstrual status: Oral contraceptives).  General:   Alert,  Well-developed, well-nourished, pleasant and cooperative in NAD Head:  Normocephalic and atraumatic. Eyes:  Sclera clear, no icterus.   Conjunctiva pink. Ears:  Normal auditory acuity. Nose:  No deformity, discharge, or lesions. Mouth:  No deformity or lesions,oropharynx pink & moist. Neck:  Supple; no masses or thyromegaly. Lungs:  Respirations even and unlabored.  Clear throughout to auscultation.   No wheezes, crackles, or rhonchi. No acute distress. Heart:  Regular rate and rhythm; no murmurs, clicks, rubs, or gallops. Abdomen:  Normal bowel sounds. Soft, non-tender and nondistended without masses, hepatosplenomegaly or hernias noted.  No guarding or rebound tenderness.   Rectal: Not performed Msk:  Symmetrical without gross deformities. Good, equal movement & strength bilaterally. Pulses:  Normal pulses noted. Extremities:  No clubbing or edema.  No cyanosis. Neurologic:  Alert and oriented x3;  grossly normal neurologically. Skin:  Intact without significant lesions or rashes. No jaundice. Psych:  Alert and cooperative. Normal mood and affect.  Imaging Studies: Reviewed  Assessment and Plan:   ELIANAH BURROWES is a 42 y.o. African-American female with history of ileocolonic Crohn's disease, stricturing phenotype diagnosed in May 2021, treated with budesonide, prednisone followed by Humira and temporarily on combination therapy with Imuran, later switched to [facility], switched back to Humira s/p laparoscopic ileocolic resection on 01/31/2022, with primary anastomosis.  Ileocolonic Crohn's disease with stricturing phenotype: S/p ileocolic resection in 01/2022 Colonoscopy in 03/2022 revealed no postop recurrence of Crohn's disease Humira  reinitiated in January 2024, experienced mild intermittent headaches and arthralgias.  Patient had neurologic workup by [facility] neurologist, Dr. Sherryll Burger in the past for headaches, no evidence of multiple sclerosis based on MRI in 01/2018.  She has not seen the neurologist since 11/2020.  Her headaches were thought to be secondary to intractable migraine.  EEG was also normal in 12/2020 Headaches have resolved Recommend to check Humira trough levels and antibodies.   Recheck CBC, CMP and CRP today Check fecal calprotectin levels  IBD Health Maintenance   1.TB status: QuantiFERON gold in 09/2021 negative 2. Anemia: None 3.Immunizations: Hep A antibody negative and B immune/reactive, Influenza recommend annual influenza vaccine, prevnar received, pneumovax received, Varicella unknown, Zoster recommend Shingrix vaccine, prescription sent 4.Cancer screening I) Colon cancer/dysplasia surveillance: None II) Cervical cancer: Pap smear up-to-date III) Skin cancer - counseled about annual skin exam by dermatology and skin protection in summer using sun screen SPF > 50, clothing 5.Bone health Vitamin D status: Mild vitamin D deficiency, continue vitamin D supplements Bone density testing: Not performed 5. Labs: Today and every 3 months 6. Smoking: None 7. NSAIDs and Antibiotics use: None     Follow up in 6 months   Arlyss Repress, MD

## 2023-06-29 LAB — SERIAL MONITORING

## 2023-06-30 DIAGNOSIS — K50012 Crohn's disease of small intestine with intestinal obstruction: Secondary | ICD-10-CM | POA: Diagnosis not present

## 2023-06-30 LAB — HEPATIC FUNCTION PANEL
ALT: 21 IU/L (ref 0–32)
AST: 18 [IU]/L (ref 0–40)
Albumin: 4.2 g/dL (ref 3.9–4.9)
Alkaline Phosphatase: 64 IU/L (ref 44–121)
Bilirubin Total: 0.3 mg/dL (ref 0.0–1.2)
Bilirubin, Direct: 0.11 mg/dL (ref 0.00–0.40)
Total Protein: 6.9 g/dL (ref 6.0–8.5)

## 2023-06-30 LAB — CBC
Hematocrit: 41.2 % (ref 34.0–46.6)
Hemoglobin: 13.2 g/dL (ref 11.1–15.9)
MCH: 31 pg (ref 26.6–33.0)
MCHC: 32 g/dL (ref 31.5–35.7)
MCV: 97 fL (ref 79–97)
Platelets: 312 10*3/uL (ref 150–450)
RBC: 4.26 x10E6/uL (ref 3.77–5.28)
RDW: 12.1 % (ref 11.7–15.4)
WBC: 6.6 10*3/uL (ref 3.4–10.8)

## 2023-06-30 LAB — ADALIMUMAB+AB (SERIAL MONITOR)
Adalimumab Drug Level: 4.9 ug/mL
Anti-Adalimumab Antibody: 327 ng/mL

## 2023-06-30 LAB — C-REACTIVE PROTEIN: CRP: 13 mg/L — ABNORMAL HIGH (ref 0–10)

## 2023-07-01 LAB — CALPROTECTIN, FECAL: Calprotectin, Fecal: 509 ug/g — ABNORMAL HIGH (ref 0–120)

## 2023-07-07 ENCOUNTER — Telehealth: Payer: Self-pay

## 2023-07-07 ENCOUNTER — Other Ambulatory Visit: Payer: Self-pay

## 2023-07-07 DIAGNOSIS — K50012 Crohn's disease of small intestine with intestinal obstruction: Secondary | ICD-10-CM

## 2023-07-07 MED ORDER — NA SULFATE-K SULFATE-MG SULF 17.5-3.13-1.6 GM/177ML PO SOLN: 354.0000 mL | Freq: Once | ORAL | 0 refills | Status: AC

## 2023-07-07 NOTE — Telephone Encounter (Signed)
Patient verbalized understanding of results. Schedule colonoscopy for 07/23/2023 in [place]. Sent prep to the pharmacy. Went over instructions, mailed them and sent to [employer]

## 2023-07-07 NOTE — Telephone Encounter (Signed)
-----   Message from [facility] sent at 07/07/2023 12:47 PM EDT ----- Please inform patient that her stool studies continue to show inflammation indicating active Crohn's disease.  Also, she developed antibodies against Humira and her Humira drug levels are suboptimal.  I recommend repeat colonoscopy to assess if Crohn's has recurred and if so, we have to know the severity of active Crohn's disease before discussing next options.  Humira will no longer work for her long-term.  She can continue Humira for now  [employer]

## 2023-07-16 ENCOUNTER — Encounter: Payer: Self-pay | Admitting: Family Medicine

## 2023-07-16 ENCOUNTER — Other Ambulatory Visit: Payer: Self-pay

## 2023-07-16 ENCOUNTER — Encounter: Payer: Self-pay | Admitting: Pharmacist

## 2023-07-16 ENCOUNTER — Other Ambulatory Visit: Payer: Self-pay | Admitting: Family Medicine

## 2023-07-16 ENCOUNTER — Encounter: Payer: Self-pay | Admitting: Gastroenterology

## 2023-07-16 MED ORDER — ALBUTEROL SULFATE HFA 108 (90 BASE) MCG/ACT IN AERS: 1.0000 | INHALATION_SPRAY | Freq: Four times a day (QID) | RESPIRATORY_TRACT | 1 refills | Status: DC | PRN

## 2023-07-16 MED ORDER — ALBUTEROL SULFATE HFA 108 (90 BASE) MCG/ACT IN AERS
1.0000 | INHALATION_SPRAY | Freq: Four times a day (QID) | RESPIRATORY_TRACT | 1 refills | Status: DC | PRN
  Filled 2023-07-16: qty 18, fill #0

## 2023-07-16 NOTE — Anesthesia Preprocedure Evaluation (Addendum)
Anesthesia Evaluation  Patient identified by MRN, date of birth, ID band Patient awake    Reviewed: Allergy & Precautions, H&P , NPO status , Patient's Chart, lab work & pertinent test results  Airway Mallampati: III  TM Distance: >3 FB Neck ROM: Full    Dental no notable dental hx.  Patient denies any dentures or partials:   Pulmonary asthma , former smoker   Pulmonary exam normal breath sounds clear to auscultation       Cardiovascular hypertension, Normal cardiovascular exam Rhythm:Regular Rate:Normal     Neuro/Psych  Headaches PSYCHIATRIC DISORDERS Anxiety Depression     Neuromuscular disease  negative psych ROS   GI/Hepatic negative GI ROS, Neg liver ROS,GERD  ,,  Endo/Other  negative endocrine ROS    Renal/GU negative Renal ROS  negative genitourinary   Musculoskeletal  (+) Arthritis ,    Abdominal   Peds negative pediatric ROS (+)  Hematology negative hematology ROS (+)   Anesthesia Other Findings Anxiety  Depression Panic disorder  Low back pain Marital conflict  Thyromegaly Crohn's disease (HCC)  Tobacco abuse Joint pain  Asthma  Migraine headache GERD (gastroesophageal reflux disease)  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) Hypertension  Patient denies any dentures or partials   Reproductive/Obstetrics negative OB ROS                             Anesthesia Physical Anesthesia Plan  ASA: 2  Anesthesia Plan: General   Post-op Pain Management:    Induction: Intravenous  PONV Risk Score and Plan:   Airway Management Planned: Natural Airway and Nasal Cannula  Additional Equipment:   Intra-op Plan:   Post-operative Plan:   Informed Consent: I have reviewed the patients History and Physical, chart, labs and discussed the procedure including the risks, benefits and alternatives for the proposed anesthesia with the patient or authorized representative who has  indicated his/her understanding and acceptance.     Dental Advisory Given  Plan Discussed with: Anesthesiologist, CRNA and Surgeon  Anesthesia Plan Comments: (Patient consented for risks of anesthesia including but not limited to:  - adverse reactions to medications - risk of airway placement if required - damage to eyes, teeth, lips or other oral mucosa - nerve damage due to positioning  - sore throat or hoarseness - Damage to heart, brain, nerves, lungs, other parts of body or loss of life  Patient voiced understanding.)        Anesthesia Quick Evaluation

## 2023-07-23 ENCOUNTER — Other Ambulatory Visit: Payer: Self-pay | Admitting: Gastroenterology

## 2023-07-23 ENCOUNTER — Encounter
Admission: RE | Disposition: A | Discharge status: Home / Self Care | Payer: Self-pay | Source: Home / Self Care | Attending: Gastroenterology

## 2023-07-23 ENCOUNTER — Encounter: Payer: Self-pay | Admitting: Gastroenterology

## 2023-07-23 ENCOUNTER — Ambulatory Visit: Payer: Medicaid Other | Admitting: Anesthesiology

## 2023-07-23 ENCOUNTER — Telehealth: Payer: Self-pay

## 2023-07-23 ENCOUNTER — Other Ambulatory Visit: Payer: Self-pay

## 2023-07-23 ENCOUNTER — Ambulatory Visit
Admission: RE | Admit: 2023-07-23 | Discharge: 2023-07-23 | Disposition: A | Discharge status: Home / Self Care | Payer: Medicaid Other | Attending: Gastroenterology | Admitting: Gastroenterology

## 2023-07-23 DIAGNOSIS — K50012 Crohn's disease of small intestine with intestinal obstruction: Secondary | ICD-10-CM

## 2023-07-23 DIAGNOSIS — K9189 Other postprocedural complications and disorders of digestive system: Secondary | ICD-10-CM | POA: Diagnosis not present

## 2023-07-23 DIAGNOSIS — Z87891 Personal history of nicotine dependence: Secondary | ICD-10-CM | POA: Insufficient documentation

## 2023-07-23 DIAGNOSIS — K219 Gastro-esophageal reflux disease without esophagitis: Secondary | ICD-10-CM | POA: Insufficient documentation

## 2023-07-23 DIAGNOSIS — Z980 Intestinal bypass and anastomosis status: Secondary | ICD-10-CM | POA: Insufficient documentation

## 2023-07-23 DIAGNOSIS — K6389 Other specified diseases of intestine: Secondary | ICD-10-CM | POA: Insufficient documentation

## 2023-07-23 DIAGNOSIS — I10 Essential (primary) hypertension: Secondary | ICD-10-CM | POA: Diagnosis not present

## 2023-07-23 DIAGNOSIS — K639 Disease of intestine, unspecified: Secondary | ICD-10-CM | POA: Diagnosis not present

## 2023-07-23 DIAGNOSIS — K5080 Crohn's disease of both small and large intestine without complications: Secondary | ICD-10-CM

## 2023-07-23 HISTORY — DX: Essential (primary) hypertension: I10

## 2023-07-23 HISTORY — PX: COLONOSCOPY WITH PROPOFOL: SHX5780

## 2023-07-23 LAB — POCT PREGNANCY, URINE: Preg Test, Ur: NEGATIVE

## 2023-07-23 SURGERY — COLONOSCOPY WITH PROPOFOL
Anesthesia: General | Site: Rectum

## 2023-07-23 MED ORDER — LACTATED RINGERS IV SOLN: INTRAVENOUS | Status: DC

## 2023-07-23 MED ORDER — LIDOCAINE HCL (CARDIAC) PF 100 MG/5ML IV SOSY
PREFILLED_SYRINGE | INTRAVENOUS | Status: DC | PRN
  Administered 2023-07-23: 60 mg via INTRAVENOUS

## 2023-07-23 MED ORDER — SODIUM CHLORIDE 0.9 % IV SOLN: INTRAVENOUS | Status: DC

## 2023-07-23 MED ORDER — STERILE WATER FOR IRRIGATION IR SOLN
Status: DC | PRN
  Administered 2023-07-23: 1

## 2023-07-23 MED ORDER — FENTANYL CITRATE (PF) 100 MCG/2ML IJ SOLN
INTRAMUSCULAR | Status: AC
  Filled 2023-07-23: qty 2

## 2023-07-23 MED ORDER — PHENYLEPHRINE HCL (PRESSORS) 10 MG/ML IV SOLN
INTRAVENOUS | Status: DC | PRN
Start: 2023-07-23 — End: 2023-07-23
  Administered 2023-07-23 (×2): 80 ug via INTRAVENOUS

## 2023-07-23 MED ORDER — PROPOFOL 10 MG/ML IV BOLUS
INTRAVENOUS | Status: AC
  Filled 2023-07-23: qty 20

## 2023-07-23 MED ORDER — ROCURONIUM BROMIDE 10 MG/ML (PF) SYRINGE
PREFILLED_SYRINGE | INTRAVENOUS | Status: AC
  Filled 2023-07-23: qty 10

## 2023-07-23 MED ORDER — PHENYLEPHRINE 80 MCG/ML (10ML) SYRINGE FOR IV PUSH (FOR BLOOD PRESSURE SUPPORT)
PREFILLED_SYRINGE | INTRAVENOUS | Status: AC
  Filled 2023-07-23: qty 10

## 2023-07-23 MED ORDER — SUCCINYLCHOLINE CHLORIDE 200 MG/10ML IV SOSY
PREFILLED_SYRINGE | INTRAVENOUS | Status: AC
  Filled 2023-07-23: qty 10

## 2023-07-23 MED ORDER — PROPOFOL 1000 MG/100ML IV EMUL
INTRAVENOUS | Status: AC
  Filled 2023-07-23: qty 100

## 2023-07-23 MED ORDER — PROPOFOL 10 MG/ML IV BOLUS
INTRAVENOUS | Status: DC | PRN
  Administered 2023-07-23: 40 mg via INTRAVENOUS
  Administered 2023-07-23: 30 mg via INTRAVENOUS
  Administered 2023-07-23: 20 mg via INTRAVENOUS
  Administered 2023-07-23 (×2): 40 mg via INTRAVENOUS
  Administered 2023-07-23: 80 mg via INTRAVENOUS
  Administered 2023-07-23 (×4): 40 mg via INTRAVENOUS

## 2023-07-23 MED ORDER — ETOMIDATE 2 MG/ML IV SOLN
INTRAVENOUS | Status: AC
  Filled 2023-07-23: qty 10

## 2023-07-23 SURGICAL SUPPLY — 25 items

## 2023-07-23 NOTE — H&P (Signed)
Arlyss Repress, MD [address]  [address]  [place], [place] [address]  Main: [phone]  Fax: [phone] Pager: [phone]  Primary Care Physician:  Dorcas Carrow, DO Primary Gastroenterologist:  Dr. Arlyss Repress  Pre-Procedure History & Physical: HPI:  Cheryl Flowers is a 42 y.o. female is here for an colonoscopy.   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   Asthma    Used albuterol inhaler last Sunday   Crohn's disease (HCC)    Depression    GERD (gastroesophageal reflux disease)    Hypertension    Joint pain    secondary to Crohn's   Low back pain    Marital conflict    Migraine headache    approx 2x/month   Panic disorder    Thyromegaly    enlarged thyroid   Tobacco abuse 05/12/2018   Wears dentures    partial upper    Past Surgical History:  Procedure Laterality Date   CESAREAN SECTION  2003 and 2010   CHOLECYSTECTOMY  2007   COLONOSCOPY WITH PROPOFOL N/A 03/07/2020   Procedure: COLONOSCOPY WITH PROPOFOL;  Surgeon: Pasty Spillers, MD;  Location: [facility];  Service: Endoscopy;  Laterality: N/A;   COLONOSCOPY WITH PROPOFOL N/A 07/27/2020   Procedure: COLONOSCOPY WITH PROPOFOL;  Surgeon: Pasty Spillers, MD;  Location: [facility];  Service: Endoscopy;  Laterality: N/A;   COLONOSCOPY WITH PROPOFOL N/A 03/27/2021   Procedure: COLONOSCOPY WITH PROPOFOL;  Surgeon: Pasty Spillers, MD;  Location: [facility];  Service: Endoscopy;  Laterality: N/A;   COLONOSCOPY WITH PROPOFOL N/A 08/28/2021   Procedure: COLONOSCOPY WITH PROPOFOL;  Surgeon: Pasty Spillers, MD;  Location: [facility];  Service: Endoscopy;  Laterality: N/A;   COLONOSCOPY WITH PROPOFOL N/A 04/22/2022   Procedure: COLONOSCOPY WITH PROPOFOL;  Surgeon: Toney Reil, MD;  Location: [facility];  Service: Endoscopy;  Laterality: N/A;   ESOPHAGOGASTRODUODENOSCOPY  (EGD) WITH PROPOFOL N/A 04/22/2022   Procedure: ESOPHAGOGASTRODUODENOSCOPY (EGD) WITH PROPOFOL;  Surgeon: Toney Reil, MD;  Location: [facility];  Service: Endoscopy;  Laterality: N/A;   SMALL INTESTINE SURGERY     removed portion of small intestine   TUBAL LIGATION  2010    Prior to Admission medications   Medication Sig Start Date End Date Taking? Authorizing Provider  acetaminophen (TYLENOL) 500 MG tablet Take 1-2 tablets (500 mg - 1 g total) by mouth every 6 hours as needed for pain. Prescription not provided, medication is available over the counter. Take as directed on label 01/30/22  Yes [provider]  albuterol (VENTOLIN HFA) 108 (90 Base) MCG/ACT inhaler Inhale 1-2 puffs into the lungs every 6 (six) hours as needed for wheezing or shortness of breath. 07/16/23  Yes Johnson, Megan P, DO  HUMIRA, 2 PEN, 40 MG/0.4ML PNKT SMARTSIG:40 Milligram(s) SUB-Q Every 2 Weeks 11/19/22  Yes [provider]  lisinopril (ZESTRIL) 5 MG tablet Take 1 tablet (5 mg total) by mouth daily. 01/26/23  Yes Johnson, Megan P, DO  QUEtiapine (SEROQUEL) 25 MG tablet Take 0.5-1 tablets (12.5-25 mg total) by mouth 2 (two) times daily. Patient taking differently: Take 12.5-25 mg by mouth as needed. 01/26/23  Yes Johnson, Megan P, DO  norethindrone (AYGESTIN) 5 MG tablet Take 1 tablet by mouth daily. 06/04/20 06/22/23  [provider]    Allergies as of 07/07/2023 - Review Complete 06/23/2023  Allergen Reaction Noted  Morphine and codeine Itching 12/15/2015   Sulfa antibiotics Hives 12/15/2015    Family History  Problem Relation Age of Onset   Kidney disease Mother    Hypertension Mother    Rheum arthritis Mother    Ankylosing spondylitis Mother    Hypertension Father    Diabetes Father    Breast cancer Maternal Aunt        early 50   Crohn's disease Maternal Aunt    Lupus Maternal Aunt    Heart attack Maternal Grandmother    Stroke Maternal Grandfather     Social  History   Socioeconomic History   Marital status: Married    Spouse name: Not on file   Number of children: Not on file   Years of education: Not on file   Highest education level: Associate degree: occupational, Scientist, product/process development, or vocational program  Occupational History   Not on file  Tobacco Use   Smoking status: Former    Current packs/day: 0.00    Average packs/day: 0.3 packs/day for 19.0 years (4.8 ttl pk-yrs)    Types: Cigarettes    Start date: 01/26/2003    Quit date: 01/25/2022    Years since quitting: 1.4   Smokeless tobacco: Never  Vaping Use   Vaping status: Former  Substance and Sexual Activity   Alcohol use: Not Currently   Drug use: No   Sexual activity: Yes    Birth control/protection: Surgical  Other Topics Concern   Not on file  Social History Narrative   Not on file   Social Determinants of Health   Financial Resource Strain: Low Risk  (01/26/2023)   Overall Financial Resource Strain (CARDIA)    Difficulty of Paying Living Expenses: Not very hard  Food Insecurity: No Food Insecurity (01/26/2023)   Hunger Vital Sign    Worried About Running Out of Food in the Last Year: Never true    Ran Out of Food in the Last Year: Never true  Transportation Needs: No Transportation Needs (01/26/2023)   PRAPARE - Administrator, Civil Service (Medical): No    Lack of Transportation (Non-Medical): No  Physical Activity: Unknown (01/26/2023)   Exercise Vital Sign    Days of Exercise per Week: Patient declined    Minutes of Exercise per Session: Not on file  Stress: Stress Concern Present (01/26/2023)   [employer] of Occupational Health - Occupational Stress Questionnaire    Feeling of Stress : Rather much  Social Connections: Moderately Integrated (01/26/2023)   Social Connection and Isolation Panel [NHANES]    Frequency of Communication with Friends and Family: Once a week    Frequency of Social Gatherings with Friends and Family: Once a week    Attends Religious  Services: More than 4 times per year    Active Member of [employer] or Organizations: Yes    Attends Engineer, structural: More than 4 times per year    Marital Status: Married  Catering manager Violence: Not on file    Review of Systems: See HPI, otherwise negative ROS  Physical Exam: BP 112/78   Pulse 88   Temp 97.7 F (36.5 C) (Temporal)   Ht 5' 4.49" (1.638 m)   Wt 101.8 kg   SpO2 98%   BMI 37.95 kg/m  General:   Alert,  pleasant and cooperative in NAD Head:  Normocephalic and atraumatic. Neck:  Supple; no masses or thyromegaly. Lungs:  Clear throughout to auscultation.    Heart:  Regular rate and rhythm.  Abdomen:  Soft, nontender and nondistended. Normal bowel sounds, without guarding, and without rebound.   Neurologic:  Alert and  oriented x4;  grossly normal neurologically.  Impression/Plan: Cheryl Flowers is here for an colonoscopy to be performed for ileocolonic crohn's disease  Risks, benefits, limitations, and alternatives regarding  colonoscopy have been reviewed with the patient.  Questions have been answered.  All parties agreeable.   Lannette Donath, MD  07/23/2023, 11:00 AM

## 2023-07-23 NOTE — Op Note (Signed)
[facility] Gastroenterology Patient Name: Cheryl Flowers Procedure Date: 07/23/2023 11:29 AM MRN: [id number] Account #: [id number] Date of Birth: [birth year] Admit Type: Outpatient Age: 42 Room: [facility] OR ROOM 01 Gender: Female Note Status: Finalized Instrument Name: [id number] Procedure:             Colonoscopy Indications:           Last colonoscopy: June 2023, Disease activity                         assessment of Crohn's disease of the small bowel and                         colon, Assess therapeutic response to therapy of                         Crohn's disease of the small bowel and colon Providers:             Toney Reil MD, MD Referring MD:          Dorcas Carrow (Referring MD) Medicines:             General Anesthesia Complications:         No immediate complications. Estimated blood loss: None. Procedure:             Pre-Anesthesia Assessment:                        - Prior to the procedure, a History and Physical was                         performed, and patient medications and allergies were                         reviewed. The patient is competent. The risks and                         benefits of the procedure and the sedation options and                         risks were discussed with the patient. All questions                         were answered and informed consent was obtained.                         Patient identification and proposed procedure were                         verified by the physician, the nurse, the                         anesthesiologist, the anesthetist and the technician                         in the pre-procedure area in the procedure room in the                         endoscopy suite. Mental Status Examination: alert and  oriented. Airway Examination: normal oropharyngeal                         airway and neck mobility. Respiratory Examination:                         clear to  auscultation. CV Examination: normal.                         Prophylactic Antibiotics: The patient does not require                         prophylactic antibiotics. Prior Anticoagulants: The                         patient has taken no anticoagulant or antiplatelet                         agents. ASA Grade Assessment: II - A patient with mild                         systemic disease. After reviewing the risks and                         benefits, the patient was deemed in satisfactory                         condition to undergo the procedure. The anesthesia                         plan was to use general anesthesia. Immediately prior                         to administration of medications, the patient was                         re-assessed for adequacy to receive sedatives. The                         heart rate, respiratory rate, oxygen saturations,                         blood pressure, adequacy of pulmonary ventilation, and                         response to care were monitored throughout the                         procedure. The physical status of the patient was                         re-assessed after the procedure.                        After obtaining informed consent, the colonoscope was                         passed under direct vision. Throughout the procedure,  the patient's blood pressure, pulse, and oxygen                         saturations were monitored continuously. The                         Colonoscope was introduced through the anus and                         advanced to the the ileocolonic anastomosis. The                         colonoscopy was performed without difficulty. The                         patient tolerated the procedure well. The quality of                         the bowel preparation was good. The terminal ileum,                         ileocecal valve, appendiceal orifice, and rectum were                          photographed. Findings:      The perianal and digital rectal examinations were normal. Pertinent       negatives include normal sphincter tone and no palpable rectal lesions.      The neo-terminal ileum appeared normal. Biopsies were taken with a cold       forceps for histology.      There was evidence of a prior end-to-side ileo-colonic anastomosis in       the ascending colon. This was patent and was characterized by       inflammation. The anastomosis was traversed. Biopsies were taken with a       cold forceps for histology.      Multiple scattered and patchy non-bleeding aphthae were found in the       descending colon and in the transverse colon. No stigmata of recent       bleeding were seen. Biopsies were taken with a cold forceps for       histology.      A diffuse area of mildly erythematous mucosa was found in the       recto-sigmoid colon. Biopsies were taken with a cold forceps for       histology.      The rectum appeared normal. Biopsies were taken with a cold forceps for       histology.      The retroflexed view of the distal rectum and anal verge was normal and       showed no anal or rectal abnormalities. Impression:            - The examined portion of the ileum was normal.                         Biopsied.                        - Patent end-to-side ileo-colonic anastomosis,  characterized by inflammation. Biopsied.                        - Aphtha in the descending colon and in the transverse                         colon. Biopsied.                        - Erythematous mucosa in the recto-sigmoid colon.                         Biopsied.                        - The rectum is normal. Biopsied.                        - The distal rectum and anal verge are normal on                         retroflexion view. Recommendation:        - Discharge patient to home (with escort).                        - Resume previous diet today.                         - Continue present medications.                        - Await pathology results.                        - Return to my office as previously scheduled. Procedure Code(s):     --- Professional ---                        [id number], Colonoscopy, flexible; with biopsy, single or                         multiple Diagnosis Code(s):     --- Professional ---                        Z98.0, Intestinal bypass and anastomosis status                        K63.89, Other specified diseases of intestine                        K50.80, Crohn's disease of both small and large                         intestine without complications CPT copyright 2022 American Medical Association. All rights reserved. The codes documented in this report are preliminary and upon coder review may  be revised to meet current compliance requirements. Dr. Libby Maw Toney Reil MD, MD 07/23/2023 12:07:04 PM This report has been signed electronically. Number of Addenda: 0 Note Initiated On: 07/23/2023 11:29 AM Scope Withdrawal Time: 0 hours 20 minutes 42 seconds  Total Procedure Duration: 0 hours 23 minutes 30 seconds  Estimated Blood Loss:  Estimated blood loss: none.      [facility]

## 2023-07-23 NOTE — Telephone Encounter (Signed)
Per Samara Snide from today needs to be seen, just scoped her to discuss about her crohn's. Per Dr. Allegra Lai can see her on 07/30/2023 either In person or mychart. Called and left a message for call back

## 2023-07-23 NOTE — Transfer of Care (Signed)
Immediate Anesthesia Transfer of Care Note  Patient: Cheryl Flowers Pulse  Procedure(s) Performed: COLONOSCOPY WITH PROPOFOL (Rectum)  Patient Location: PACU  Anesthesia Type: General  Level of Consciousness: awake, alert  and patient cooperative  Airway and Oxygen Therapy: Patient Spontanous Breathing and Patient connected to supplemental oxygen  Post-op Assessment: Post-op Vital signs reviewed, Patient's Cardiovascular Status Stable, Respiratory Function Stable, Patent Airway and No signs of Nausea or vomiting  Post-op Vital Signs: Reviewed and stable  Complications: No notable events documented.

## 2023-07-23 NOTE — Anesthesia Postprocedure Evaluation (Signed)
Anesthesia Post Note  Patient: Cheryl Flowers  Procedure(s) Performed: COLONOSCOPY WITH PROPOFOL (Rectum)  Patient location during evaluation: PACU Anesthesia Type: General Level of consciousness: awake and alert Pain management: pain level controlled Vital Signs Assessment: post-procedure vital signs reviewed and stable Respiratory status: spontaneous breathing, nonlabored ventilation, respiratory function stable and patient connected to nasal cannula oxygen Cardiovascular status: blood pressure returned to baseline and stable Postop Assessment: no apparent nausea or vomiting Anesthetic complications: no   No notable events documented.   Last Vitals:  Vitals:   07/23/23 1208 07/23/23 1221  BP:  111/72  Pulse: 88 97  Resp: 19 14  Temp: 36.8 C   SpO2: 99% 99%    Last Pain:  Vitals:   07/23/23 1221  TempSrc:   PainSc: 0-No pain                 [name]

## 2023-07-24 NOTE — Telephone Encounter (Signed)
Made appointment for 07/30/2023 at 1:00pm

## 2023-07-24 NOTE — Telephone Encounter (Signed)
Called and left a message for call back  

## 2023-07-24 NOTE — Telephone Encounter (Signed)
Patient called back and I transfer her to Dr. Allegra Lai nurse Morrie Sheldon.

## 2023-07-27 ENCOUNTER — Encounter: Payer: Self-pay | Admitting: Gastroenterology

## 2023-07-27 LAB — SURGICAL PATHOLOGY

## 2023-07-28 ENCOUNTER — Encounter: Payer: Self-pay | Admitting: Family Medicine

## 2023-07-28 ENCOUNTER — Ambulatory Visit: Payer: Medicaid Other | Admitting: Family Medicine

## 2023-07-28 VITALS — BP 138/82 | HR 87 | Temp 98.3°F | Wt 225.0 lb

## 2023-07-28 DIAGNOSIS — F411 Generalized anxiety disorder: Secondary | ICD-10-CM | POA: Diagnosis not present

## 2023-07-28 DIAGNOSIS — Z23 Encounter for immunization: Secondary | ICD-10-CM | POA: Diagnosis not present

## 2023-07-28 DIAGNOSIS — Z0000 Encounter for general adult medical examination without abnormal findings: Secondary | ICD-10-CM

## 2023-07-28 DIAGNOSIS — I10 Essential (primary) hypertension: Secondary | ICD-10-CM | POA: Diagnosis not present

## 2023-07-28 DIAGNOSIS — Z1231 Encounter for screening mammogram for malignant neoplasm of breast: Secondary | ICD-10-CM

## 2023-07-28 DIAGNOSIS — F988 Other specified behavioral and emotional disorders with onset usually occurring in childhood and adolescence: Secondary | ICD-10-CM

## 2023-07-28 DIAGNOSIS — F332 Major depressive disorder, recurrent severe without psychotic features: Secondary | ICD-10-CM

## 2023-07-28 DIAGNOSIS — F410 Panic disorder [episodic paroxysmal anxiety] without agoraphobia: Secondary | ICD-10-CM | POA: Diagnosis not present

## 2023-07-28 LAB — URINALYSIS, ROUTINE W REFLEX MICROSCOPIC
Bilirubin, UA: NEGATIVE
Glucose, UA: NEGATIVE
Ketones, UA: NEGATIVE
Leukocytes,UA: NEGATIVE
Nitrite, UA: NEGATIVE
Protein,UA: NEGATIVE
Specific Gravity, UA: 1.015 (ref 1.005–1.030)
Urobilinogen, Ur: 0.2 mg/dL (ref 0.2–1.0)
pH, UA: 6 (ref 5.0–7.5)

## 2023-07-28 LAB — MICROSCOPIC EXAMINATION: Bacteria, UA: NONE SEEN

## 2023-07-28 LAB — MICROALBUMIN, URINE WAIVED
Creatinine, Urine Waived: 300 mg/dL (ref 10–300)
Microalb, Ur Waived: 30 mg/L — ABNORMAL HIGH (ref 0–19)
Microalb/Creat Ratio: <30 mg/g (ref <30)

## 2023-07-28 MED ORDER — LISINOPRIL 5 MG PO TABS: 5.0000 mg | ORAL_TABLET | Freq: Every day | ORAL | 1 refills | Status: DC

## 2023-07-28 NOTE — Assessment & Plan Note (Signed)
Referral to psychiatry placed today. Await their input.

## 2023-07-28 NOTE — Progress Notes (Signed)
BP 138/82   Pulse 87   Temp 98.3 F (36.8 C) (Oral)   Wt 225 lb (102.1 kg)   SpO2 98%   BMI 38.04 kg/m    Subjective:    Patient ID: Cheryl Flowers, female    DOB: [birth year], 42 y.o.   MRN: [id number]  HPI: Cheryl Flowers is a 42 y.o. female presenting on 07/28/2023 for comprehensive medical examination. Current medical complaints include:  HYPERTENSION  Hypertension status: controlled  Satisfied with current treatment? yes Duration of hypertension: chronic BP monitoring frequency:  rarely BP medication side effects:  no Medication compliance: excellent compliance Previous BP meds:lisinopril Aspirin: no Recurrent headaches: no Visual changes: no Palpitations: no Dyspnea: no Chest pain: no Lower extremity edema: no Dizzy/lightheaded: no  ???ADHD  ADHD status: untreated. Was on medicine 2-3 years ago, but then it was stopped Satisfied with current therapy: no Medication compliance:   not on anything Previous psychiatry evaluation: yes Previous medications: yes adderall   Work/school performance:  fair Difficulty sustaining attention/completing tasks: yes Distracted by extraneous stimuli: yes Does not listen when spoken to: no  Fidgets with hands or feet: yes Unable to stay in seat: no Blurts out/interrupts others: no ADHD Medication Side Effects: no    Decreased appetite: no    Headache: no    Sleeping disturbance pattern: no    Irritability: no    Rebound effects (worse than baseline) off medication: no    Anxiousness: no    Dizziness: no    Tics: no  Menopausal Symptoms: no  Depression Screen done today and results listed below:     07/28/2023    8:34 AM 01/26/2023    8:51 AM 11/26/2022    9:08 AM 10/28/2022    2:57 PM 01/23/2022   11:34 AM  Depression screen PHQ 2/9  Decreased Interest 1 1 2 2 2   Down, Depressed, Hopeless 2 0 1 1 2   PHQ - 2 Score 3 1 3 3 4   Altered sleeping 3 1 2 2 1   Tired, decreased energy 2 2 3 3 2   Change in  appetite 1 0 1 3 2   Feeling bad or failure about yourself  3 0 1 1 2   Trouble concentrating 2 2 3 3 3   Moving slowly or fidgety/restless 2 0 1 1 1   Suicidal thoughts 0 0 0 0 0  PHQ-9 Score 16 6 14 16 15   Difficult doing work/chores Very difficult Somewhat difficult Very difficult Very difficult    Past Medical History:  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   Asthma    Used albuterol inhaler last Sunday   Crohn's disease (HCC)    Depression    GERD (gastroesophageal reflux disease)    Hypertension    Joint pain    secondary to Crohn's   Low back pain    Marital conflict    Migraine headache    approx 2x/month   Panic disorder    Thyromegaly    enlarged thyroid   Tobacco abuse 05/12/2018   Wears dentures    partial upper    Surgical History:  Past Surgical History:  Procedure Laterality Date   CESAREAN SECTION  2003 and 2010   CHOLECYSTECTOMY  2007   COLONOSCOPY WITH PROPOFOL N/A 03/07/2020   Procedure: COLONOSCOPY WITH PROPOFOL;  Surgeon: Pasty Spillers, MD;  Location: [facility];  Service: Endoscopy;  Laterality: N/A;  COLONOSCOPY WITH PROPOFOL N/A 07/27/2020   Procedure: COLONOSCOPY WITH PROPOFOL;  Surgeon: Pasty Spillers, MD;  Location: [facility];  Service: Endoscopy;  Laterality: N/A;   COLONOSCOPY WITH PROPOFOL N/A 03/27/2021   Procedure: COLONOSCOPY WITH PROPOFOL;  Surgeon: Pasty Spillers, MD;  Location: [facility];  Service: Endoscopy;  Laterality: N/A;   COLONOSCOPY WITH PROPOFOL N/A 08/28/2021   Procedure: COLONOSCOPY WITH PROPOFOL;  Surgeon: Pasty Spillers, MD;  Location: [facility];  Service: Endoscopy;  Laterality: N/A;   COLONOSCOPY WITH PROPOFOL N/A 04/22/2022   Procedure: COLONOSCOPY WITH PROPOFOL;  Surgeon: Toney Reil, MD;  Location: [facility];  Service: Endoscopy;  Laterality: N/A;   COLONOSCOPY WITH PROPOFOL N/A  07/23/2023   Procedure: COLONOSCOPY WITH PROPOFOL;  Surgeon: Toney Reil, MD;  Location: [facility];  Service: Endoscopy;  Laterality: N/A;   ESOPHAGOGASTRODUODENOSCOPY (EGD) WITH PROPOFOL N/A 04/22/2022   Procedure: ESOPHAGOGASTRODUODENOSCOPY (EGD) WITH PROPOFOL;  Surgeon: Toney Reil, MD;  [facility];  Service: Endoscopy;  Laterality: N/A;   SMALL INTESTINE SURGERY     removed portion of small intestine   TUBAL LIGATION  2010    Medications:  Current Outpatient Medications on File Prior to Visit  Medication Sig   acetaminophen (TYLENOL) 500 MG tablet Take 1-2 tablets (500 mg - 1 g total) by mouth every 6 hours as needed for pain. Prescription not provided, medication is available over the counter. Take as directed on label   albuterol (VENTOLIN HFA) 108 (90 Base) MCG/ACT inhaler Inhale 1-2 puffs into the lungs every 6 (six) hours as needed for wheezing or shortness of breath.   HUMIRA, 2 PEN, 40 MG/0.4ML PNKT SMARTSIG:40 Milligram(s) SUB-Q Every 2 Weeks   QUEtiapine (SEROQUEL) 25 MG tablet Take 0.5-1 tablets (12.5-25 mg total) by mouth 2 (two) times daily. (Patient taking differently: Take 12.5-25 mg by mouth as needed.)   norethindrone (AYGESTIN) 5 MG tablet Take 1 tablet by mouth daily.   No current facility-administered medications on file prior to visit.    Allergies:  Allergies  Allergen Reactions   Morphine And Codeine Itching   Sulfa Antibiotics Hives    Social History:  Social History   Socioeconomic History   Marital status: Married    Spouse name: Not on file   Number of children: Not on file   Years of education: Not on file   Highest education level: Associate degree: occupational, Scientist, product/process development, or vocational program  Occupational History   Not on file  Tobacco Use   Smoking status: Former    Current packs/day: 0.00    Average packs/day: 0.3 packs/day for 19.0 years (4.8 ttl pk-yrs)    Types: Cigarettes    Start date:  01/26/2003    Quit date: 01/25/2022    Years since quitting: 1.5   Smokeless tobacco: Never  Vaping Use   Vaping status: Former  Substance and Sexual Activity   Alcohol use: Not Currently   Drug use: No   Sexual activity: Yes    Birth control/protection: Surgical  Other Topics Concern   Not on file  Social History Narrative   Not on file   Social Determinants of Health   Financial Resource Strain: Low Risk  (01/26/2023)   Overall Financial Resource Strain (CARDIA)    Difficulty of Paying Living Expenses: Not very hard  Food Insecurity: No Food Insecurity (01/26/2023)   Hunger Vital Sign    Worried About Running Out of Food in the Last Year: Never true  Ran Out of Food in the Last Year: Never true  Transportation Needs: No Transportation Needs (01/26/2023)   PRAPARE - Administrator, Civil Service (Medical): No    Lack of Transportation (Non-Medical): No  Physical Activity: Unknown (01/26/2023)   Exercise Vital Sign    Days of Exercise per Week: Patient declined    Minutes of Exercise per Session: Not on file  Stress: Stress Concern Present (01/26/2023)   [employer] of Occupational Health - Occupational Stress Questionnaire    Feeling of Stress : Rather much  Social Connections: Moderately Integrated (01/26/2023)   Social Connection and Isolation Panel [NHANES]    Frequency of Communication with Friends and Family: Once a week    Frequency of Social Gatherings with Friends and Family: Once a week    Attends Religious Services: More than 4 times per year    Active Member of [employer] or Organizations: Yes    Attends Engineer, structural: More than 4 times per year    Marital Status: Married  Catering manager Violence: Not on file   Social History   Tobacco Use  Smoking Status Former   Current packs/day: 0.00   Average packs/day: 0.3 packs/day for 19.0 years (4.8 ttl pk-yrs)   Types: Cigarettes   Start date: 01/26/2003   Quit date: 01/25/2022   Years since  quitting: 1.5  Smokeless Tobacco Never   Social History   Substance and Sexual Activity  Alcohol Use Not Currently    Family History:  Family History  Problem Relation Age of Onset   Kidney disease Mother    Hypertension Mother    Rheum arthritis Mother    Ankylosing spondylitis Mother    Hypertension Father    Diabetes Father    Breast cancer Maternal Aunt        early 10   Crohn's disease Maternal Aunt    Lupus Maternal Aunt    Heart attack Maternal Grandmother    Stroke Maternal Grandfather     Past medical history, surgical history, medications, allergies, family history and social history reviewed with patient today and changes made to appropriate areas of the chart.   Review of Systems  Constitutional: Negative.   HENT: Negative.    Eyes: Negative.   Respiratory:  Positive for shortness of breath. Negative for cough, hemoptysis, sputum production and wheezing.   Cardiovascular:  Positive for leg swelling. Negative for chest pain, palpitations, orthopnea, claudication and PND.  Gastrointestinal:  Positive for blood in stool and diarrhea. Negative for abdominal pain, constipation, heartburn, melena, nausea and vomiting.  Genitourinary: Negative.   Musculoskeletal: Negative.   Skin: Negative.   Neurological:  Positive for dizziness. Negative for tingling, tremors, sensory change, speech change, focal weakness, seizures, loss of consciousness, weakness and headaches.  Endo/Heme/Allergies: Negative.   Psychiatric/Behavioral:  Negative for depression, hallucinations, memory loss, substance abuse and suicidal ideas. The patient is nervous/anxious. The patient does not have insomnia.    All other ROS negative except what is listed above and in the HPI.      Objective:    BP 138/82   Pulse 87   Temp 98.3 F (36.8 C) (Oral)   Wt 225 lb (102.1 kg)   SpO2 98%   BMI 38.04 kg/m   Wt Readings from Last 3 Encounters:  07/28/23 225 lb (102.1 kg)  07/23/23 224 lb 8 oz  (101.8 kg)  06/22/23 226 lb 4 oz (102.6 kg)    Physical Exam Vitals and nursing note reviewed.  Constitutional:      General: She is not in acute distress.    Appearance: Normal appearance. She is obese. She is not ill-appearing, toxic-appearing or diaphoretic.  HENT:     Head: Normocephalic and atraumatic.     Right Ear: Tympanic membrane, ear canal and external ear normal. There is no impacted cerumen.     Left Ear: Tympanic membrane, ear canal and external ear normal. There is no impacted cerumen.     Nose: Nose normal. No congestion or rhinorrhea.     Mouth/Throat:     Mouth: Mucous membranes are moist.     Pharynx: Oropharynx is clear. No oropharyngeal exudate or posterior oropharyngeal erythema.  Eyes:     General: No scleral icterus.       Right eye: No discharge.        Left eye: No discharge.     Extraocular Movements: Extraocular movements intact.     Conjunctiva/sclera: Conjunctivae normal.     Pupils: Pupils are equal, round, and reactive to light.  Neck:     Vascular: No carotid bruit.  Cardiovascular:     Rate and Rhythm: Normal rate and regular rhythm.     Pulses: Normal pulses.     Heart sounds: No murmur heard.    No friction rub. No gallop.  Pulmonary:     Effort: Pulmonary effort is normal. No respiratory distress.     Breath sounds: Normal breath sounds. No stridor. No wheezing, rhonchi or rales.  Chest:     Chest wall: No tenderness.  Abdominal:     General: Abdomen is flat. Bowel sounds are normal. There is no distension.     Palpations: Abdomen is soft. There is no mass.     Tenderness: There is no abdominal tenderness. There is no right CVA tenderness, left CVA tenderness, guarding or rebound.     Hernia: No hernia is present.  Genitourinary:    Comments: Breast and pelvic exams deferred with shared decision making Musculoskeletal:        General: No swelling, tenderness, deformity or signs of injury.     Cervical back: Normal range of motion and  neck supple. No rigidity. No muscular tenderness.     Right lower leg: No edema.     Left lower leg: No edema.  Lymphadenopathy:     Cervical: No cervical adenopathy.  Skin:    General: Skin is warm and dry.     Capillary Refill: Capillary refill takes less than 2 seconds.     Coloration: Skin is not jaundiced or pale.     Findings: No bruising, erythema, lesion or rash.  Neurological:     General: No focal deficit present.     Mental Status: She is alert and oriented to person, place, and time. Mental status is at baseline.     Cranial Nerves: No cranial nerve deficit.     Sensory: No sensory deficit.     Motor: No weakness.     Coordination: Coordination normal.     Gait: Gait normal.     Deep Tendon Reflexes: Reflexes normal.  Psychiatric:        Mood and Affect: Mood normal.        Behavior: Behavior normal.        Thought Content: Thought content normal.        Judgment: Judgment normal.     Results for orders placed or performed in visit on 07/23/23  Surgical pathology  Result Value Ref Range  SURGICAL PATHOLOGY      SURGICAL PATHOLOGY [facility] [address] [place], [place] [address] Telephone [phone] or [phone] Fax [phone]  REPORT OF SURGICAL PATHOLOGY   Accession #: [id number] Patient Name: SHANITRA, PHILLIPPI Visit # :   MRN: [id number] Physician: Lannette Donath DOB/Age [birth year] (Age: 74) Gender: F Collected Date: 07/23/2023 Received Date: 07/23/2023  FINAL DIAGNOSIS       1. Terminal ileum, biopsy, Neo :       -  SMALL INTESTINAL MUCOSA WITH NO SIGNIFICANT PATHOLOGY.       2. Colon, biopsy, ileo colon anastomosis :       -  INTESTINAL (PREDOMINANTLY COLONIC TYPE) MUCOSA WITH REACTIVE/REPARATIVE      CHANGE AND FOCAL MINIMAL ACTIVITY CONSISTENT WITH ANASTOMOTIC SITE.       3. Transverse Colon Biopsy,  :       -  COLONIC MUCOSA WITH NO SIGNIFICANT PATHOLOGY.       4. Descending Colon Biopsy,   :       -  COLONIC MUCOSA WITH NO SIGNIFICANT PATHOLOGY.       5. Sigmoid Colon Biopsy,  :       -  COLONIC MUCOSA WI TH FOCAL MINIMAL ACTIVITY AND REACTIVE/REPARATIVE CHANGE IN      THE BACKGROUND OF SEPARATE FRAGMENTS OF COLONIC MUCOSA WITH NO SIGNIFICANT      PATHOLOGY.       6. Sigmoid Colon Biopsy, Recto :       -  COLONIC MUCOSA WITH NO SIGNIFICANT PATHOLOGY.       7. Rectum, polyp(s),  :       -  COLONIC MUCOSA WITH NO SIGNIFICANT PATHOLOGY.      NOTE: THE CLINICAL HISTORY OF CROHN'S DISEASE IS NOTED.  WHILE THERE IS FOCAL      MINIMAL ACTIVITY IN THE ABOVE BIOPSIES, THERE IS NO DEFINITIVE EVIDENCE OF      CHRONICITY, GRANULOMAS, MORPHOLOGIC EVIDENCE OF VIRAL CYTOPATHIC EFFECT OR      DYSPLASIA.       ELECTRONIC SIGNATURE : Francene Castle, Mark, Pathologist, Electronic Signature  MICROSCOPIC DESCRIPTION  CASE COMMENTS STAINS USED IN DIAGNOSIS: H&E H&E H&E-2 H&E H&E H&E H&E H&E    CLINICAL HISTORY  SPECIMEN(S) OBTAINED 1. Terminal ileum, biopsy, Neo 2. Colon, biopsy, Ileo Colon Anastomosis 3. Transverse Colon Biopsy, 4. Descending Colon Biopsy, 5. Sigmoid  Colon Biopsy, 6. Sigmoid Colon Biopsy, Recto 7. Rectum, polyp(s),  SPECIMEN COMMENTS: SPECIMEN CLINICAL INFORMATION: 1. Crohn's disease of small intestine with intestinal obstruction 3. Crohn's disease of small intestine with intestinal obstruction 4. Crohn's disease of small intestine with intestinal obstruction 5. Crohn's disease of small intestine with intestinal obstruction 6. Crohn's disease of small intestine with intestinal obstruction 7. Crohn's disease of small intestine with intestinal obstruction    Gross Description 1. "Neo terminal ileum", received in formalin is a 0.8 x 0.5 x 0.2 cm aggregate of 4 tan-gray tissue fragments.The specimen is submitted in toto in 1 block (1A). 2. "Ileo colon anastomosis-cold biopsy", received in formalin is a 0.9 x 0.6 x 0.1 cm aggregate of multiple  tan-gray tissue fragments.The specimen is filtered and submitted in toto in 1 block (2A). 3. 'Transverse colon-cold biopsy", received in formalin is a 0.5 x 0.5 x 0.1 cm agg regate of 5 gray-tan tissue fragments.The specimen is submitted in toto in 1 block (3A). 4. "Descending colon-cold biopsy", received in formalin is a 1.3 x 0.5 x 0.1 cm aggregate  of multiple gray-tan tissue fragments.The specimen is filtered and submitted in toto in 1 block (4A). 5. "Sigmoid colon-cold biopsies", received in formalin is a 1.5 x 0.5 x 0.1 cm aggregate of multiple gray-tan tissue fragments.The specimen is filtered and submitted in toto in 1 block (5A). 6. "Rectosigmoid colon - cold biopsies", received in formalin is a 0.8 x 0.4 x 0.1 cm aggregate of 3 gray-tan tissue fragments.The specimen is submitted in toto in 1 block (6A). 7. "Rectum - cold biopsies", received in formalin is a 0.9 x 0.4 x 0.1 cm aggregate of 4 gray-tan tissue fragments.The specimen is submitted in toto in 1 block (7A).      [facility] 07/24/2023        Report signed out from the following location(s) . [facility] [address], [place] [address] CLIA #: [id number]  [facility] [address] [place], [place] [address] CLIA #: [id number]       Assessment & Plan:   Problem List Items Addressed This Visit       Cardiovascular and Mediastinum   Essential hypertension    Under good control on current regimen. Continue current regimen. Continue to monitor. Call with any concerns. Refills given. Labs drawn today.       Relevant Medications   lisinopril (ZESTRIL) 5 MG tablet     Other   Generalized anxiety disorder with panic attacks    Referral to psychiatry placed today. Await their input.       Relevant Orders   Ambulatory referral to Psychiatry   Attention deficit disorder (ADD) in adult    Referral to psychiatry placed today. Await their input.       Relevant Orders    Ambulatory referral to Psychiatry   Severe recurrent major depression without psychotic features [facility])    Referral to psychiatry placed today. Await their input.       Relevant Orders   Ambulatory referral to Psychiatry   Other Visit Diagnoses     Routine general medical examination at a health care facility    -  Primary   Vaccines up to date. Screening labs checked today. Pap N/A. Mammo ordered. Continue diet and exercise. Call with any concerns.   Relevant Orders   CBC with Differential/Platelet   Comprehensive metabolic panel   Lipid Panel w/o Chol/HDL Ratio   Urinalysis, Routine w reflex microscopic   TSH   Microalbumin, Urine Waived   Needs flu shot       Flu shot given today.   Relevant Orders   Flu vaccine trivalent PF, 6mos and older(Flulaval,Afluria,Fluarix,Fluzone) (Completed)   Encounter for screening mammogram for malignant neoplasm of breast       Mammo ordered today.   Relevant Orders   MM 3D SCREENING MAMMOGRAM BILATERAL BREAST        Follow up plan: Return in about 6 months (around 01/26/2024).   LABORATORY TESTING:  - Pap smear: not applicable  IMMUNIZATIONS:   - Tdap: Tetanus vaccination status reviewed: last tetanus booster within 10 years. - Influenza: Administered today - Pneumovax: Up to date - Prevnar: Up to date - COVID: Refused - HPV: Refused - Shingrix vaccine: Not applicable  SCREENING: -Mammogram: Ordered today  - Colonoscopy: Up to date   PATIENT COUNSELING:   Advised to take 1 mg of folate supplement per day if capable of pregnancy.   Sexuality: Discussed sexually transmitted diseases, partner selection, use of condoms, avoidance of unintended pregnancy  and contraceptive  alternatives.   Advised to avoid cigarette smoking.  I discussed with the patient that most people either abstain from alcohol or drink within safe limits (<=14/week and <=4 drinks/occasion for males, <=7/weeks and <= 3 drinks/occasion for females) and that the  risk for alcohol disorders and other health effects rises proportionally with the number of drinks per week and how often a drinker exceeds daily limits.  Discussed cessation/primary prevention of drug use and availability of treatment for abuse.   Diet: Encouraged to adjust caloric intake to maintain  or achieve ideal body weight, to reduce intake of dietary saturated fat and total fat, to limit sodium intake by avoiding high sodium foods and not adding table salt, and to maintain adequate dietary potassium and calcium preferably from fresh fruits, vegetables, and low-fat dairy products.    stressed the importance of regular exercise  Injury prevention: Discussed safety belts, safety helmets, smoke detector, smoking near bedding or upholstery.   Dental health: Discussed importance of regular tooth brushing, flossing, and dental visits.    NEXT PREVENTATIVE PHYSICAL DUE IN 1 YEAR. Return in about 6 months (around 01/26/2024).

## 2023-07-28 NOTE — Assessment & Plan Note (Signed)
Under good control on current regimen. Continue current regimen. Continue to monitor. Call with any concerns. Refills given. Labs drawn today.   

## 2023-07-29 LAB — CBC WITH DIFFERENTIAL/PLATELET
Basophils Absolute: 0.1 10*3/uL (ref 0.0–0.2)
Basos: 1 %
EOS (ABSOLUTE): 0.1 10*3/uL (ref 0.0–0.4)
Eos: 2 %
Hematocrit: 43 % (ref 34.0–46.6)
Hemoglobin: 14.1 g/dL (ref 11.1–15.9)
Immature Grans (Abs): 0 10*3/uL (ref 0.0–0.1)
Immature Granulocytes: 0 %
Lymphocytes Absolute: 3.6 10*3/uL — ABNORMAL HIGH (ref 0.7–3.1)
Lymphs: 43 %
MCH: 31.7 pg (ref 26.6–33.0)
MCHC: 32.8 g/dL (ref 31.5–35.7)
MCV: 97 fL (ref 79–97)
Monocytes Absolute: 0.5 10*3/uL (ref 0.1–0.9)
Monocytes: 6 %
Neutrophils Absolute: 4.2 10*3/uL (ref 1.4–7.0)
Neutrophils: 48 %
Platelets: 341 10*3/uL (ref 150–450)
RBC: 4.45 x10E6/uL (ref 3.77–5.28)
RDW: 11.8 % (ref 11.7–15.4)
WBC: 8.6 10*3/uL (ref 3.4–10.8)

## 2023-07-29 LAB — COMPREHENSIVE METABOLIC PANEL
ALT: 20 [IU]/L (ref 0–32)
AST: 16 [IU]/L (ref 0–40)
Albumin: 4.3 g/dL (ref 3.9–4.9)
Alkaline Phosphatase: 63 [IU]/L (ref 44–121)
BUN/Creatinine Ratio: 7 — ABNORMAL LOW (ref 9–23)
BUN: 7 mg/dL (ref 6–24)
Bilirubin Total: 0.3 mg/dL (ref 0.0–1.2)
CO2: 21 mmol/L (ref 20–29)
Calcium: 9.6 mg/dL (ref 8.7–10.2)
Chloride: 107 mmol/L — ABNORMAL HIGH (ref 96–106)
Creatinine, Ser: 0.98 mg/dL (ref 0.57–1.00)
Globulin, Total: 2.9 g/dL (ref 1.5–4.5)
Glucose: 92 mg/dL (ref 70–99)
Potassium: 3.9 mmol/L (ref 3.5–5.2)
Sodium: 140 mmol/L (ref 134–144)
Total Protein: 7.2 g/dL (ref 6.0–8.5)
eGFR: 74 mL/min/{1.73_m2} (ref >59)

## 2023-07-29 LAB — LIPID PANEL W/O CHOL/HDL RATIO
Cholesterol, Total: 165 mg/dL (ref 100–199)
HDL: 44 mg/dL (ref >39)
LDL Chol Calc (NIH): 107 mg/dL — ABNORMAL HIGH (ref 0–99)
Triglycerides: 71 mg/dL (ref 0–149)
VLDL Cholesterol Cal: 14 mg/dL (ref 5–40)

## 2023-07-29 LAB — TSH: TSH: 1.43 u[IU]/mL (ref 0.450–4.500)

## 2023-07-30 ENCOUNTER — Encounter: Payer: Self-pay | Admitting: Gastroenterology

## 2023-07-30 ENCOUNTER — Ambulatory Visit: Payer: Medicaid Other | Admitting: Gastroenterology

## 2023-07-30 VITALS — BP 122/80 | HR 87 | Temp 98.6°F | Wt 226.5 lb

## 2023-07-30 DIAGNOSIS — K5080 Crohn's disease of both small and large intestine without complications: Secondary | ICD-10-CM

## 2023-07-30 DIAGNOSIS — K50812 Crohn's disease of both small and large intestine with intestinal obstruction: Secondary | ICD-10-CM | POA: Diagnosis not present

## 2023-07-30 NOTE — Progress Notes (Signed)
Arlyss Repress, MD [address]  [address]  [place], [place] [address]  Main: [phone]  Fax: [phone]    Gastroenterology Consultation  Referring Provider:     Dorcas Carrow, DO Primary Care Physician:  Dorcas Carrow, DO Primary Gastroenterologist:  Dr. Arlyss Repress Reason for Consultation: Ileocolonic Crohn's disease        HPI:   Cheryl Flowers is a 42 y.o. female referred by Dr. Laural Benes, Oralia Rud, DO  for consultation & management of small bowel Crohn's.  She is here to discuss about postop recurrence of Crohn's disease based on recent colonoscopy on 07/23/2023.  She also developed high titer antibodies against Humira and subtherapeutic drug levels.  Her fecal calprotectin levels were elevated.  She continues to have abdominal bloating worse postprandial as well as left-sided upper abdominal discomfort.  She reports inch loss after cutting down on processed foods, sodas, sugary drinks.  She does consume rice on a regular basis.  She tries to walk on treadmill, does not exercise regularly.  She is not able to lose any more weight  Crohn's disease classification:   Age: 89 to 35 Location: Ileocolonic Behavior: stricturing Perianal: no   IBD diagnosis: Ileocolonic Crohn's disease, stricturing phenotype diagnosed based on the index colonoscopy in May 2021   Disease course:Patient reports that she has been longstanding symptoms of blood in the stool, right lower quadrant pain, abdominal distention, diagnosed with ileocolonic Crohn's disease with a terminal ileal stricture based on the colonoscopy in May 2021.  She was initially on budesonide for about a month, later on prednisone followed by initiation of Humira in July 2021.  Repeat colonoscopy revealed persistent stricture of the terminal ileum and therefore Humira was stopped and switched to Entyvio.  Prior to stopping Humira, patient was started on combination therapy, Imuran was added and was managed by  her rheumatologist due to diagnosis of sacroiliitis.  Patient developed nausea, headache on Imuran and therefore discontinued it.  Patient was then switched to [facility] since June 2022, she was concerned about mood changes if these are associated with [place], therefore this was stopped and switched to Humira again since January 2023.  Patient underwent colonoscopy every 6 to 8 months to assess response to biologic, she did have persistent TI stricture, however, MR enterography since her diagnosis showed mild wall thickening with mucosal enhancement involving the distal ileum and cecum including the ileocecal valve.  She underwent 3 MR enterography studies to date which revealed consistent findings as above.  Patient was on Humira monotherapy every other week until she underwent a laparoscopic ileocolonic resection on 01/29/2022 by Dr. Milbert Coulter at [facility], 6.6 cm of the TI and 6.3 cm of the colon were removed with primary anastomosis.  Patient's postop recovery was uneventful.  EGD and colonoscopy in 03/2022 did not reveal any postop recurrence of Crohn's disease. Patient tried Rinvoq 45 mg daily for 1 month in October and self discontinued due to symptoms of headache, high blood pressure and chronic cough.   Fecal calprotectin levels was 227 in 07/2020, improved to 289 in 09/2020, 37 in 4/22. CRP has been normal. Humira restarted in January 2024 with induction followed by maintenance biweekly.  She experienced mild intermittent headaches and arthralgias.  Patient had neurologic workup by [facility] neurologist, Dr. Sherryll Burger in the past for headaches, no evidence of multiple sclerosis based on MRI in 01/2018.  She has not seen the neurologist since 11/2020.  Her headaches were thought to be  secondary to intractable migraine.  EEG was also normal in 12/2020 Headaches have resolved.  Unfortunately, she developed hydrated antibodies against Temodar based on labs in 05/2023, fecal calprotectin levels were elevated to 509, colonoscopy  07/23/2023 revealed postop recurrence of Crohn's disease, Rutgeert's i2.  Extra intestinal manifestations: Sacroiliitis   IBD surgical history: laparoscopic ileocolonic resection on 01/29/2022 by Dr. Milbert Coulter at [facility], 6.6 cm of the TI and 6.3 cm of the colon were removed with primary anastomosis.   Imaging:   MRE   04/02/2020 IMPRESSION: 1. Mild wall thickening and mucosal enhancement involving the distal ileum and cecum. This is consistent with Crohn's disease. 2. No evidence of abscess or other complication. 3. Small uterine fibroids. 4. Uterine fibroids.   02/17/2021 IMPRESSION: 1. Short segment of terminal ileum with mild wall thickening and mucosal enhancement noted at the level of the ileal cecal valve. There is also mild wall thickening and enhancement involving the cecum. Findings consistent with Crohn's inflammation. No complications identified. Specifically, there is no abscess, obstruction or sign of penetrating disease. No evidence for penetrating disease or abscess. 2. Mildly increased caliber and enhancement of the proximal appendix which tapers to a normal caliber mid and distal appendix. Favor secondary inflammation. 3. Uterine fibroids.   12/30/2021 IMPRESSION: No significant change in short-segment wall thickening and enhancement involving the terminal ileum and ileocecal valve, consistent with history of Crohn disease. No evidence of penetrating disease or other complication.   CTE none   SBFT none   Procedures:   Index colonoscopy 03/09/2020 - Mucosal ulceration. Biopsied. - Patchy moderate inflammation was found in the cecum, rule out Crohn's disease. Biopsied. - Patchy mild inflammation was found in the transverse colon and in the ascending colon, rule out Crohn's disease. Biopsied. - The descending colon is normal. - Erythematous mucosa in the rectum and in the sigmoid colon. Biopsied. - The distal rectum and anal verge are normal on retroflexion  view. DIAGNOSIS:  A. COLON, CECUM; COLD BIOPSY:  - CHRONIC COLITIS WITH FOCAL MILD ACTIVITY.  - NEGATIVE FOR DYSPLASIA AND MALIGNANCY.   B. ILEOCECAL VALVE; COLD BIOPSY:  - CHRONIC ENTERITIS / COLITIS WITH MODERATE ACTIVITY AND ULCERATION.  - NEGATIVE FOR DYSPLASIA AND MALIGNANCY.   C. COLON, ASCENDING; COLD BIOPSY:  - CHRONIC COLITIS WITH MODERATE ACTIVITY.  - NEGATIVE FOR DYSPLASIA AND MALIGNANCY.   D. COLON, TRANSVERSE; COLD BIOPSY:  - CHRONIC COLITIS WITH MODERATE ACTIVITY.  - NEGATIVE FOR DYSPLASIA AND MALIGNANCY.   E. COLON, DESCENDING; COLD BIOPSY:  - CHRONIC INACTIVE COLITIS.  - NEGATIVE FOR DYSPLASIA AND MALIGNANCY.   F. COLON, RECTOSIGMOID; COLD BIOPSY:  - CHRONIC INACTIVE COLITIS.  - NEGATIVE FOR DYSPLASIA AND MALIGNANCY.      Colonoscopy 07/27/2020 - The examined portion of the ileum was normal. Biopsied. - Crohn's disease. Inflammation was found in the transverse colon, in the ascending colon and at the cecum. This was moderate in severity, worsened compared to previous examinations. Biopsied. - The distal rectum and anal verge are normal on retroflexion view. DIAGNOSIS:  A. COLON, RIGHT; COLD BIOPSY:  - MILD AND FOCALLY MODERATE CHRONIC ACTIVE COLITIS INVOLVING ALL BIOPSY  FRAGMENTS, CONSISTENT WITH PATIENT'S KNOWN HISTORY OF CROHNS.  - FOCAL NON-NECROTIZING GRANULOMAS; AFB AND PASF STAINS ARE NEGATIVE;  STAIN CONTROLS WORKED APPROPRIATELY.  - NEGATIVE FOR DYSPLASIA AND MALIGNANCY.   B.  TERMINAL ILEUM; COLD BIOPSY:  - UNREMARKABLE SMALL INTESTINAL MUCOSA.  - NEGATIVE FOR ACTIVE ENTERITIS, FEATURES OF CHRONICITY, DYSPLASIA, AND  MALIGNANCY.   C.  COLON, LEFT; COLD BIOPSY:  - CHRONIC INACTIVE COLITIS INVOLVING A MINORITY OF THE BIOPSY FRAGMENTS,  CONSISTENT WITH PATIENT'S KNOWN HISTORY OF CROHNS.  - NEGATIVE FOR DYSPLASIA AND MALIGNANCY.    Colonoscopy 03/27/2021 - Stricture at the ileocecal valve. Biopsied. - Decreased mucosa vascular pattern in the  cecum. Biopsied. - One 5 mm polyp in the sigmoid colon, removed with a cold snare. Resected and retrieved. - The rectum, sigmoid colon, descending colon, transverse colon and ascending colon are normal. Biopsied. DIAGNOSIS:  A. TERMINAL ILEUM; COLD BIOPSY:  - MILD CHRONIC ACTIVE ENTERITIS CONSISTENT WITH PATIENT'S HISTORY OF  CROHN'S DISEASE.  - NEGATIVE FOR DYSPLASIA AND MALIGNANCY.   B.  COLON, CECUM AND ASCENDING; COLD BIOPSY:  - MILD CHRONIC ACTIVE COLITIS CONSISTENT WITH PATIENT'S KNOWN HISTORY OF  CROHN'S.  - NEGATIVE FOR DYSPLASIA AND MALIGNANCY.   C.  COLON, TRANSVERSE; COLD BIOPSY:  - PATCHY CHRONIC INACTIVE COLITIS CONSISTENT WITH PATIENT'S HISTORY OF  CROHN'S.  - NEGATIVE FOR DYSPLASIA AND MALIGNANCY.   D.  COLON, DESCENDING; COLD BIOPSY:  - PATCHY CHRONIC INACTIVE COLITIS CONSISTENT WITH PATIENT'S HISTORY OF  CROHN'S.  - NEGATIVE FOR DYSPLASIA AND MALIGNANCY.   E.  COLON, RECTOSIGMOID; COLD BIOPSY:  - PATCHY CHRONIC INACTIVE COLITIS CONSISTENT WITH PATIENT'S HISTORY OF  CROHN'S.  - NEGATIVE FOR DYSPLASIA AND MALIGNANCY.    F.  COLON POLYP, SIGMOID; COLD SNARE:  - HYPERPLASTIC POLYP.  - NEGATIVE FOR DYSPLASIA AND MALIGNANCY.    Colonoscopy 08/28/2021 - Stricture in the terminal ileum. Biopsied. - Altered vascular mucosa in the ascending colon and in the cecum. Biopsied. - The rectum, sigmoid colon, descending colon and transverse colon are normal. Biopsied. DIAGNOSIS:  A. TERMINAL ILEUM; COLD BIOPSY:  - CHRONIC ILEITIS WITH MILD ACTIVITY.  - NEGATIVE FOR GRANULOMA, DYSPLASIA, AND MALIGNANCY.   B. COLON, ASCENDING AND CECUM; COLD BIOPSY:  - CHRONIC COLITIS WITH MINIMAL ACTIVITY (FOCAL SUPERFICIAL CRYPTITIS).  - NEGATIVE FOR GRANULOMA, DYSPLASIA, AND MALIGNANCY.   C. COLON, TRANSVERSE; COLD BIOPSY:  - CHRONIC COLITIS WITHOUT ACTIVITY.  - NEGATIVE FOR GRANULOMA, DYSPLASIA, AND MALIGNANCY.   D. COLON, DESCENDING; COLD BIOPSY:  - CHRONIC COLITIS WITHOUT  ACTIVITY.  - NEGATIVE FOR GRANULOMA, DYSPLASIA, AND MALIGNANCY.   E. COLON, RECTOSIGMOID; COLD BIOPSY:  - CHRONIC COLITIS WITHOUT ACTIVITY.  - NEGATIVE FOR GRANULOMA, DYSPLASIA, AND MALIGNANCY.   Colonoscopy 04/22/2022 - Patent end-to-side ileo-colonic anastomosis, characterized by healthy appearing mucosa. - The entire examined colon is normal. - The distal rectum and anal verge are normal on retroflexion view. - The examined portion of the ileum was normal. - No specimens collected.  Upper Endoscopy 04/22/2022 - Normal esophagus. - Normal stomach. - Normal examined duodenum. - No specimens collected.  Colonoscopy 07/23/2023 - The examined portion of the ileum was normal. Biopsied. - Patent end- to- side ileo- colonic anastomosis, characterized by inflammation. Biopsied. - Aphtha in the descending colon and in the transverse colon. Biopsied. - Erythematous mucosa in the recto- sigmoid colon. Biopsied. - The rectum is normal. Biopsied. - The distal rectum and anal verge are normal on retroflexion view. 1. Terminal ileum, biopsy, Neo :      -  SMALL INTESTINAL MUCOSA WITH NO SIGNIFICANT PATHOLOGY.       2. Colon, biopsy, ileo colon anastomosis :      -  INTESTINAL (PREDOMINANTLY COLONIC TYPE) MUCOSA WITH REACTIVE/REPARATIVE      CHANGE AND FOCAL MINIMAL ACTIVITY CONSISTENT WITH ANASTOMOTIC SITE.       3. Transverse Colon Biopsy,  :      -  COLONIC MUCOSA WITH NO SIGNIFICANT PATHOLOGY.       4. Descending Colon Biopsy,  :      -  COLONIC MUCOSA WITH NO SIGNIFICANT PATHOLOGY.       5. Sigmoid Colon Biopsy,  :      -  COLONIC MUCOSA WITH FOCAL MINIMAL ACTIVITY AND REACTIVE/REPARATIVE CHANGE IN      THE BACKGROUND OF SEPARATE FRAGMENTS OF COLONIC MUCOSA WITH NO SIGNIFICANT      PATHOLOGY.       6. Sigmoid Colon Biopsy, Recto :      -  COLONIC MUCOSA WITH NO SIGNIFICANT PATHOLOGY.       7. Rectum, polyp(s),  :      -  COLONIC MUCOSA WITH NO SIGNIFICANT PATHOLOGY.      NOTE: THE  CLINICAL HISTORY OF CROHN'S DISEASE IS NOTED.  WHILE THERE IS FOCAL      MINIMAL ACTIVITY IN THE ABOVE BIOPSIES, THERE IS NO DEFINITIVE EVIDENCE OF      CHRONICITY, GRANULOMAS, MORPHOLOGIC EVIDENCE OF VIRAL CYTOPATHIC EFFECT OR      DYSPLASIA.    VCE none   IBD medications:   Steroids: Prednisone, budesonide 5-ASA: None Immunomodulators: AZA, methotrexate TPMT status: normal Biologics:  Anti TNFs: Humira July 2021 to June 2022, restarted in January 2023, stopped in 12/2021 prior to ileocolonic resection Restarted Humira in 10/2022, stopped in 07/2023 she developed antibodies with postop recurrence of Crohn's disease Anti Integrins: Entyvio from June 2022 to October 2022, stopped secondary to mood changes Ustekinumab: Tofactinib: Upadacitinib: Initiated in August 2023, patient self discontinued after 4 weeks of taking Rinvoq due to high blood pressure, cough and headaches Clinical trial:   NSAIDs: None   Antiplts/Anticoagulants/Anti thrombotics: None  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   Asthma    Used albuterol inhaler last Sunday   Crohn's disease (HCC)    Depression    GERD (gastroesophageal reflux disease)    Hypertension    Joint pain    secondary to Crohn's   Low back pain    Marital conflict    Migraine headache    approx 2x/month   Panic disorder    Thyromegaly    enlarged thyroid   Tobacco abuse 05/12/2018   Wears dentures    partial upper    Past Surgical History:  Procedure Laterality Date   CESAREAN SECTION  2003 and 2010   CHOLECYSTECTOMY  2007   COLONOSCOPY WITH PROPOFOL N/A 03/07/2020   Procedure: COLONOSCOPY WITH PROPOFOL;  Surgeon: Pasty Spillers, MD;  Location: [facility];  Service: Endoscopy;  Laterality: N/A;   COLONOSCOPY WITH PROPOFOL N/A 07/27/2020   Procedure: COLONOSCOPY WITH PROPOFOL;  Surgeon: Pasty Spillers, MD;  Location: [facility];   Service: Endoscopy;  Laterality: N/A;   COLONOSCOPY WITH PROPOFOL N/A 03/27/2021   Procedure: COLONOSCOPY WITH PROPOFOL;  Surgeon: Pasty Spillers, MD;  Location: [facility];  Service: Endoscopy;  Laterality: N/A;   COLONOSCOPY WITH PROPOFOL N/A 08/28/2021   Procedure: COLONOSCOPY WITH PROPOFOL;  Surgeon: Pasty Spillers, MD;  Location: [facility];  Service: Endoscopy;  Laterality: N/A;   COLONOSCOPY WITH PROPOFOL N/A 04/22/2022   Procedure: COLONOSCOPY WITH PROPOFOL;  Surgeon: Toney Reil, MD;  Location: [facility];  Service: Endoscopy;  Laterality: N/A;   COLONOSCOPY WITH PROPOFOL N/A 07/23/2023   Procedure: COLONOSCOPY WITH PROPOFOL;  Surgeon: Toney Reil, MD;  Location: [facility];  Service: Endoscopy;  Laterality: N/A;   ESOPHAGOGASTRODUODENOSCOPY (EGD) WITH PROPOFOL N/A 04/22/2022   Procedure: ESOPHAGOGASTRODUODENOSCOPY (EGD) WITH PROPOFOL;  Surgeon: Toney Reil, MD;  Location: [facility];  Service: Endoscopy;  Laterality: N/A;   SMALL INTESTINE SURGERY     removed portion of small intestine   TUBAL LIGATION  2010     Current Outpatient Medications:    acetaminophen (TYLENOL) 500 MG tablet, Take 1-2 tablets (500 mg - 1 g total) by mouth every 6 hours as needed for pain. Prescription not provided, medication is available over the counter. Take as directed on label, Disp: , Rfl:    albuterol (VENTOLIN HFA) 108 (90 Base) MCG/ACT inhaler, Inhale 1-2 puffs into the lungs every 6 (six) hours as needed for wheezing or shortness of breath., Disp: 18 g, Rfl: 1   lisinopril (ZESTRIL) 5 MG tablet, Take 1 tablet (5 mg total) by mouth daily., Disp: 90 tablet, Rfl: 1   norethindrone (AYGESTIN) 5 MG tablet, Take 1 tablet by mouth daily., Disp: , Rfl:    QUEtiapine (SEROQUEL) 25 MG tablet, Take 0.5-1 tablets (12.5-25 mg total) by mouth 2 (two) times daily. (Patient taking differently: Take 12.5-25 mg by mouth as needed.), Disp: 180  tablet, Rfl: 1   Family History  Problem Relation Age of Onset   Kidney disease Mother    Hypertension Mother    Rheum arthritis Mother    Ankylosing spondylitis Mother    Hypertension Father    Diabetes Father    Breast cancer Maternal Aunt        early 89   Crohn's disease Maternal Aunt    Lupus Maternal Aunt    Heart attack Maternal Grandmother    Stroke Maternal Grandfather      Social History   Tobacco Use   Smoking status: Former    Current packs/day: 0.00    Average packs/day: 0.3 packs/day for 19.0 years (4.8 ttl pk-yrs)    Types: Cigarettes    Start date: 01/26/2003    Quit date: 01/25/2022    Years since quitting: 1.5   Smokeless tobacco: Never  Vaping Use   Vaping status: Former  Substance Use Topics   Alcohol use: Not Currently   Drug use: No    Allergies as of 07/30/2023 - Review Complete 07/30/2023  Allergen Reaction Noted   Morphine and codeine Itching 12/15/2015   Sulfa antibiotics Hives 12/15/2015    Review of Systems:    All systems reviewed and negative except where noted in HPI.   Physical Exam:  BP 122/80 (BP Location: Right Arm, Patient Position: Sitting, Cuff Size: Normal)   Pulse 87   Temp 98.6 F (37 C) (Oral)   Wt 226 lb 8 oz (102.7 kg)   BMI 38.29 kg/m  No LMP recorded. (Menstrual status: Oral contraceptives).  General:   Alert,  Well-developed, well-nourished, pleasant and cooperative in NAD Head:  Normocephalic and atraumatic. Eyes:  Sclera clear, no icterus.   Conjunctiva pink. Ears:  Normal auditory acuity. Nose:  No deformity, discharge, or lesions. Mouth:  No deformity or lesions,oropharynx pink & moist. Neck:  Supple; no masses or thyromegaly. Lungs:  Respirations even and unlabored.  Clear throughout to auscultation.   No wheezes, crackles, or rhonchi. No acute distress. Heart:  Regular rate and rhythm; no murmurs, clicks, rubs, or gallops. Abdomen:  Normal bowel sounds. Soft, non-tender and nondistended without masses,  hepatosplenomegaly or hernias noted.  No guarding or rebound tenderness.   Rectal: Not  performed Msk:  Symmetrical without gross deformities. Good, equal movement & strength bilaterally. Pulses:  Normal pulses noted. Extremities:  No clubbing or edema.  No cyanosis. Neurologic:  Alert and oriented x3;  grossly normal neurologically. Skin:  Intact without significant lesions or rashes. No jaundice. Psych:  Alert and cooperative. Normal mood and affect.  Imaging Studies: Reviewed  Assessment and Plan:   Cheryl Flowers is a 42 y.o. African-American female with history of ileocolonic Crohn's disease, stricturing phenotype diagnosed in May 2021, treated with budesonide, prednisone followed by Humira and temporarily on combination therapy with Imuran, later switched to [facility], switched back to Humira s/p laparoscopic ileocolic resection on 01/31/2022, with primary anastomosis. Colonoscopy in 03/2022 revealed no postop recurrence of Crohn's disease.  Humira reinitiated in January 2024, repeat colonoscopy in 06/2023 revealed postop recurrence of Crohn's disease at the ileocolic anastomosis, patchy aphthous ulcers in the descending and transverse colon.  However, pathology results did not reveal active disease.  Fecal calprotectin levels are elevated and she developed Humira antibodies.  Ileocolonic Crohn's disease with stricturing phenotype: S/p ileocolic resection in 01/2022 Recommend to stop Humira Discussed about switching to antiinterleukin therapy such as ustekinumab or risankizumab.  Patient opted to try risankizumab. Risk and benefits of medication discussed including but not limited to infusion reaction, injection site reaction, allergic reaction, risk of infection, small risk of skin cancer  IBD Health Maintenance   1.TB status: QuantiFERON gold in 09/2021 negative 2. Anemia: None 3.Immunizations: Hep A antibody negative and B immune/reactive, Influenza recommend annual influenza  vaccine, prevnar received, pneumovax received, Varicella unknown, Zoster recommend Shingrix vaccine, prescription sent, however patient did not receive Shingrix vaccine 4.Cancer screening I) Colon cancer/dysplasia surveillance: None II) Cervical cancer: Pap smear up-to-date III) Skin cancer - counseled about annual skin exam by dermatology and skin protection in summer using sun screen SPF > 50, clothing 5.Bone health Vitamin D status: Mild vitamin D deficiency, continue vitamin D supplements Bone density testing: Not performed 5. Labs: Today and every 3 months 6. Smoking: None 7. NSAIDs and Antibiotics use: None     Follow up in 3 months   Arlyss Repress, MD

## 2023-08-04 ENCOUNTER — Telehealth: Payer: Self-pay

## 2023-08-04 NOTE — Telephone Encounter (Signed)
Informed patient of this information and she verbalized understanding. Faxed the request to [facility] and sent her a email to let her know to cancel the [place] and to proceed with the [facility].

## 2023-08-04 NOTE — Telephone Encounter (Signed)
Let's apply for stelara and let pt know  RV

## 2023-08-04 NOTE — Telephone Encounter (Signed)
Insurance company denied the [employer] for patient crohn's disease diease diagnosis. Please advise what you recommend.

## 2023-08-04 NOTE — Telephone Encounter (Signed)
If a peer to peer wants to be done please call [phone]

## 2023-08-12 ENCOUNTER — Telehealth: Payer: Self-pay

## 2023-08-12 NOTE — Telephone Encounter (Signed)
Patient insurance has approved patient to have the stelara. She is scheduled with [facility]. Per Sherrilyn Rist with [facility] Ms. Odle is scheduled for 10/23 with our nurse [facility].

## 2023-08-19 ENCOUNTER — Telehealth: Payer: Self-pay

## 2023-08-19 DIAGNOSIS — K5080 Crohn's disease of both small and large intestine without complications: Secondary | ICD-10-CM | POA: Diagnosis not present

## 2023-08-19 MED ORDER — USTEKINUMAB 90 MG/ML ~~LOC~~ SOSY: 90.0000 mg | PREFILLED_SYRINGE | SUBCUTANEOUS | 7 refills | Status: AC

## 2023-08-19 NOTE — Telephone Encounter (Signed)
[facility] is needing the Stelera maintenance medication

## 2023-08-19 NOTE — Telephone Encounter (Signed)
[facility] sent [place] a fax for patient to send maintenance medication of Stelera to them. Escribed the medication to them

## 2023-08-22 LAB — LAB REPORT - SCANNED: EGFR: 76

## 2023-08-27 DIAGNOSIS — F3162 Bipolar disorder, current episode mixed, moderate: Secondary | ICD-10-CM | POA: Diagnosis not present

## 2023-08-27 DIAGNOSIS — F900 Attention-deficit hyperactivity disorder, predominantly inattentive type: Secondary | ICD-10-CM | POA: Diagnosis not present

## 2023-08-27 DIAGNOSIS — F411 Generalized anxiety disorder: Secondary | ICD-10-CM | POA: Diagnosis not present

## 2023-09-15 ENCOUNTER — Other Ambulatory Visit: Payer: Self-pay | Admitting: Family Medicine

## 2023-09-15 ENCOUNTER — Telehealth: Payer: Self-pay

## 2023-09-15 ENCOUNTER — Other Ambulatory Visit: Payer: Self-pay

## 2023-09-15 MED ORDER — NORETHINDRONE ACETATE 5 MG PO TABS
5.0000 mg | ORAL_TABLET | Freq: Every day | ORAL | 12 refills | Status: AC
  Filled 2023-09-15: qty 30, 30d supply, fill #0

## 2023-09-15 NOTE — Telephone Encounter (Signed)
Per Raiford Noble with [facility] had her infused Stelara dose 11/6.  Her PA for the injection maintenance dose has been submitted and is under review.  I will keep you posted. ??

## 2023-09-16 ENCOUNTER — Telehealth: Payer: Self-pay

## 2023-09-16 NOTE — Telephone Encounter (Signed)
Has been approved from 09/15/2023 to 09/14/2024 through insurance

## 2023-09-16 NOTE — Telephone Encounter (Signed)
[facility] faxed over a fax stating they are needing a prescription for the [facility]. A prescription was sent to them on 08/19/2023 so will not resend it to them.

## 2023-09-17 ENCOUNTER — Other Ambulatory Visit: Payer: Self-pay

## 2023-09-17 DIAGNOSIS — F411 Generalized anxiety disorder: Secondary | ICD-10-CM | POA: Diagnosis not present

## 2023-09-17 DIAGNOSIS — F3162 Bipolar disorder, current episode mixed, moderate: Secondary | ICD-10-CM | POA: Diagnosis not present

## 2023-09-17 DIAGNOSIS — F900 Attention-deficit hyperactivity disorder, predominantly inattentive type: Secondary | ICD-10-CM | POA: Diagnosis not present

## 2023-10-30 ENCOUNTER — Telehealth: Payer: Self-pay

## 2023-10-30 NOTE — Telephone Encounter (Signed)
Received a PA through cover my meds for patient medication Humira. Patient is no longer on medication she is on stelara now

## 2023-11-06 ENCOUNTER — Other Ambulatory Visit: Payer: Self-pay

## 2023-11-10 DIAGNOSIS — F411 Generalized anxiety disorder: Secondary | ICD-10-CM | POA: Diagnosis not present

## 2023-11-10 DIAGNOSIS — F3162 Bipolar disorder, current episode mixed, moderate: Secondary | ICD-10-CM | POA: Diagnosis not present

## 2023-11-10 DIAGNOSIS — F900 Attention-deficit hyperactivity disorder, predominantly inattentive type: Secondary | ICD-10-CM | POA: Diagnosis not present

## 2023-11-11 ENCOUNTER — Ambulatory Visit: Payer: Medicaid Other | Admitting: Gastroenterology

## 2023-11-18 DIAGNOSIS — H5213 Myopia, bilateral: Secondary | ICD-10-CM | POA: Diagnosis not present

## 2023-11-24 DIAGNOSIS — G5602 Carpal tunnel syndrome, left upper limb: Secondary | ICD-10-CM | POA: Diagnosis not present

## 2023-11-24 DIAGNOSIS — I10 Essential (primary) hypertension: Secondary | ICD-10-CM | POA: Diagnosis not present

## 2023-11-24 DIAGNOSIS — G43119 Migraine with aura, intractable, without status migrainosus: Secondary | ICD-10-CM | POA: Diagnosis not present

## 2023-12-01 DIAGNOSIS — F900 Attention-deficit hyperactivity disorder, predominantly inattentive type: Secondary | ICD-10-CM | POA: Diagnosis not present

## 2023-12-01 DIAGNOSIS — F3162 Bipolar disorder, current episode mixed, moderate: Secondary | ICD-10-CM | POA: Diagnosis not present

## 2023-12-01 DIAGNOSIS — F411 Generalized anxiety disorder: Secondary | ICD-10-CM | POA: Diagnosis not present

## 2023-12-04 ENCOUNTER — Other Ambulatory Visit: Payer: Self-pay | Admitting: Neurology

## 2023-12-04 DIAGNOSIS — G43119 Migraine with aura, intractable, without status migrainosus: Secondary | ICD-10-CM

## 2023-12-10 ENCOUNTER — Encounter: Payer: Self-pay | Admitting: Gastroenterology

## 2023-12-10 ENCOUNTER — Ambulatory Visit
Admission: RE | Admit: 2023-12-10 | Discharge: 2023-12-10 | Disposition: A | Discharge status: Home / Self Care | Payer: Medicaid Other | Source: Ambulatory Visit | Attending: Family Medicine | Admitting: Family Medicine

## 2023-12-10 DIAGNOSIS — G9389 Other specified disorders of brain: Secondary | ICD-10-CM | POA: Diagnosis not present

## 2023-12-10 DIAGNOSIS — G43119 Migraine with aura, intractable, without status migrainosus: Secondary | ICD-10-CM | POA: Insufficient documentation

## 2023-12-10 DIAGNOSIS — R202 Paresthesia of skin: Secondary | ICD-10-CM | POA: Diagnosis not present

## 2023-12-10 DIAGNOSIS — R519 Headache, unspecified: Secondary | ICD-10-CM | POA: Diagnosis not present

## 2023-12-10 MED ORDER — GADOBUTROL 1 MMOL/ML IV SOLN
10.0000 mL | Freq: Once | INTRAVENOUS | Status: AC | PRN
  Administered 2023-12-10: 10 mL via INTRAVENOUS

## 2023-12-15 ENCOUNTER — Other Ambulatory Visit: Payer: Self-pay | Admitting: Family Medicine

## 2023-12-15 NOTE — Telephone Encounter (Signed)
 Requested Prescriptions  Pending Prescriptions Disp Refills   albuterol (VENTOLIN HFA) 108 (90 Base) MCG/ACT inhaler [Pharmacy Med Name: VENTOLIN HFA 90 MCG INHALER] 18 each 0    Sig: INHALE 1-2 PUFFS BY MOUTH EVERY 6 HOURS AS NEEDED FOR WHEEZE OR SHORTNESS OF BREATH     Pulmonology:  Beta Agonists 2 Passed - 12/15/2023  3:26 PM      Passed - Last BP in normal range    BP Readings from Last 1 Encounters:  07/30/23 122/80         Passed - Last Heart Rate in normal range    Pulse Readings from Last 1 Encounters:  07/30/23 87         Passed - Valid encounter within last 12 months    Recent Outpatient Visits           4 months ago Routine general medical examination at a health care facility   [facility] [place], Megan P, DO   10 months ago Essential hypertension   [facility] [place], [place], DO   1 year ago Essential hypertension   [facility] [place], [place], DO   1 year ago Essential hypertension   [facility] [place], Megan P, DO   1 year ago Moderate episode of recurrent major depressive disorder [facility])   [place] [facility] [place], Oralia Rud, DO       Future Appointments             In 2 weeks Vanga, Loel Dubonnet, MD [facility] [place]   In 1 month Dorcas Carrow, DO [place] [facility]

## 2023-12-22 DIAGNOSIS — F900 Attention-deficit hyperactivity disorder, predominantly inattentive type: Secondary | ICD-10-CM | POA: Diagnosis not present

## 2023-12-22 DIAGNOSIS — F411 Generalized anxiety disorder: Secondary | ICD-10-CM | POA: Diagnosis not present

## 2023-12-22 DIAGNOSIS — F3162 Bipolar disorder, current episode mixed, moderate: Secondary | ICD-10-CM | POA: Diagnosis not present

## 2023-12-23 ENCOUNTER — Other Ambulatory Visit: Payer: Self-pay

## 2023-12-30 ENCOUNTER — Encounter: Payer: Self-pay | Admitting: Gastroenterology

## 2023-12-30 ENCOUNTER — Ambulatory Visit: Payer: Medicaid Other | Admitting: Gastroenterology

## 2023-12-30 VITALS — BP 129/80 | HR 93 | Temp 98.0°F | Ht 64.5 in | Wt 235.2 lb

## 2023-12-30 DIAGNOSIS — R519 Headache, unspecified: Secondary | ICD-10-CM

## 2023-12-30 DIAGNOSIS — E669 Obesity, unspecified: Secondary | ICD-10-CM | POA: Diagnosis not present

## 2023-12-30 DIAGNOSIS — E559 Vitamin D deficiency, unspecified: Secondary | ICD-10-CM

## 2023-12-30 DIAGNOSIS — K5080 Crohn's disease of both small and large intestine without complications: Secondary | ICD-10-CM | POA: Diagnosis not present

## 2023-12-30 DIAGNOSIS — K50812 Crohn's disease of both small and large intestine with intestinal obstruction: Secondary | ICD-10-CM | POA: Diagnosis not present

## 2023-12-30 NOTE — Progress Notes (Signed)
 Arlyss Repress, MD [address]  [address]  [place], [place] [address]  Main: [phone]  Fax: [phone]    Gastroenterology Consultation  Referring Provider:     Dorcas Carrow, DO Primary Care Physician:  Dorcas Carrow, DO Primary Gastroenterologist:  Dr. Arlyss Repress Reason for Consultation: Ileocolonic Crohn's disease        HPI:   Cheryl Flowers is a 43 y.o. female referred by Dr. Laural Benes, Oralia Rud, DO  for consultation & management of small bowel Crohn's.  She is here to discuss about postop recurrence of Crohn's disease based on recent colonoscopy on 07/23/2023.  She also developed high titer antibodies against Humira and subtherapeutic drug levels.  Her fecal calprotectin levels were elevated.  She continues to have abdominal bloating worse postprandial as well as left-sided upper abdominal discomfort.  She reports inch loss after cutting down on processed foods, sodas, sugary drinks.  She does consume rice on a regular basis.  She tries to walk on treadmill, does not exercise regularly.  She is not able to lose any more weight  Follow-up visit 12/30/2023 Annice Pih is here for follow-up of Crohn's disease.  We have switched from Humira to Stelara.  Cristy Folks was not approved by her insurance.  She tolerated induction dose and currently on maintenance dose every 8 weeks.  She denies any GI symptoms, reports having formed bowel movements daily.  She continues to have ongoing episodes of headaches, closely followed by neurologist, underwent MRI brain which was unremarkable.  Latuda resulted in weight gain, therefore stopped taking medication.  Her headache was thought to be secondary to migraine which is sometimes triggered after exercise which she is limiting her.  She is not regularly following healthy diet  Crohn's disease classification:   Age: 51 to 13 Location: Ileocolonic Behavior: stricturing Perianal: no   IBD diagnosis: Ileocolonic Crohn's disease,  stricturing phenotype diagnosed based on the index colonoscopy in May 2021   Disease course:Patient reports that she has been longstanding symptoms of blood in the stool, right lower quadrant pain, abdominal distention, diagnosed with ileocolonic Crohn's disease with a terminal ileal stricture based on the colonoscopy in May 2021.  She was initially on budesonide for about a month, later on prednisone followed by initiation of Humira in July 2021.  Repeat colonoscopy revealed persistent stricture of the terminal ileum and therefore Humira was stopped and switched to Entyvio.  Prior to stopping Humira, patient was started on combination therapy, Imuran was added and was managed by her rheumatologist due to diagnosis of sacroiliitis.  Patient developed nausea, headache on Imuran and therefore discontinued it.  Patient was then switched to [facility] since June 2022, she was concerned about mood changes if these are associated with [facility], therefore this was stopped and switched to Humira again since January 2023.  Patient underwent colonoscopy every 6 to 8 months to assess response to biologic, she did have persistent TI stricture, however, MR enterography since her diagnosis showed mild wall thickening with mucosal enhancement involving the distal ileum and cecum including the ileocecal valve.  She underwent 3 MR enterography studies to date which revealed consistent findings as above.  Patient was on Humira monotherapy every other week until she underwent a laparoscopic ileocolonic resection on 01/29/2022 by Dr. Milbert Coulter at [facility], 6.6 cm of the TI and 6.3 cm of the colon were removed with primary anastomosis.  Patient's postop recovery was uneventful.  EGD and colonoscopy in 03/2022 did not reveal any  postop recurrence of Crohn's disease. Patient tried Rinvoq 45 mg daily for 1 month in October and self discontinued due to symptoms of headache, high blood pressure and chronic cough.   Fecal calprotectin levels was 227 in  07/2020, improved to 289 in 09/2020, 37 in 4/22. CRP has been normal. Humira restarted in January 2024 with induction followed by maintenance biweekly.  She experienced mild intermittent headaches and arthralgias.  Patient had neurologic workup by [facility] neurologist, Dr. Sherryll Burger in the past for headaches, no evidence of multiple sclerosis based on MRI in 01/2018.  She has not seen the neurologist since 11/2020.  Her headaches were thought to be secondary to intractable migraine.  EEG was also normal in 12/2020 Headaches have resolved.  Unfortunately, she developed hydrated antibodies against Temodar based on labs in 05/2023, fecal calprotectin levels were elevated to 509, colonoscopy 07/23/2023 revealed postop recurrence of Crohn's disease, Rutgeert's i2.  Extra intestinal manifestations: Sacroiliitis   IBD surgical history: laparoscopic ileocolonic resection on 01/29/2022 by Dr. Milbert Coulter at [facility], 6.6 cm of the TI and 6.3 cm of the colon were removed with primary anastomosis.   Imaging:   MRE   04/02/2020 IMPRESSION: 1. Mild wall thickening and mucosal enhancement involving the distal ileum and cecum. This is consistent with Crohn's disease. 2. No evidence of abscess or other complication. 3. Small uterine fibroids. 4. Uterine fibroids.   02/17/2021 IMPRESSION: 1. Short segment of terminal ileum with mild wall thickening and mucosal enhancement noted at the level of the ileal cecal valve. There is also mild wall thickening and enhancement involving the cecum. Findings consistent with Crohn's inflammation. No complications identified. Specifically, there is no abscess, obstruction or sign of penetrating disease. No evidence for penetrating disease or abscess. 2. Mildly increased caliber and enhancement of the proximal appendix which tapers to a normal caliber mid and distal appendix. Favor secondary inflammation. 3. Uterine fibroids.   12/30/2021 IMPRESSION: No significant change in short-segment  wall thickening and enhancement involving the terminal ileum and ileocecal valve, consistent with history of Crohn disease. No evidence of penetrating disease or other complication.   CTE none   SBFT none   Procedures:   Index colonoscopy 03/09/2020 - Mucosal ulceration. Biopsied. - Patchy moderate inflammation was found in the cecum, rule out Crohn's disease. Biopsied. - Patchy mild inflammation was found in the transverse colon and in the ascending colon, rule out Crohn's disease. Biopsied. - The descending colon is normal. - Erythematous mucosa in the rectum and in the sigmoid colon. Biopsied. - The distal rectum and anal verge are normal on retroflexion view. DIAGNOSIS:  A. COLON, CECUM; COLD BIOPSY:  - CHRONIC COLITIS WITH FOCAL MILD ACTIVITY.  - NEGATIVE FOR DYSPLASIA AND MALIGNANCY.   B. ILEOCECAL VALVE; COLD BIOPSY:  - CHRONIC ENTERITIS / COLITIS WITH MODERATE ACTIVITY AND ULCERATION.  - NEGATIVE FOR DYSPLASIA AND MALIGNANCY.   C. COLON, ASCENDING; COLD BIOPSY:  - CHRONIC COLITIS WITH MODERATE ACTIVITY.  - NEGATIVE FOR DYSPLASIA AND MALIGNANCY.   D. COLON, TRANSVERSE; COLD BIOPSY:  - CHRONIC COLITIS WITH MODERATE ACTIVITY.  - NEGATIVE FOR DYSPLASIA AND MALIGNANCY.   E. COLON, DESCENDING; COLD BIOPSY:  - CHRONIC INACTIVE COLITIS.  - NEGATIVE FOR DYSPLASIA AND MALIGNANCY.   F. COLON, RECTOSIGMOID; COLD BIOPSY:  - CHRONIC INACTIVE COLITIS.  - NEGATIVE FOR DYSPLASIA AND MALIGNANCY.      Colonoscopy 07/27/2020 - The examined portion of the ileum was normal. Biopsied. - Crohn's disease. Inflammation was found in the transverse colon, in the  ascending colon and at the cecum. This was moderate in severity, worsened compared to previous examinations. Biopsied. - The distal rectum and anal verge are normal on retroflexion view. DIAGNOSIS:  A. COLON, RIGHT; COLD BIOPSY:  - MILD AND FOCALLY MODERATE CHRONIC ACTIVE COLITIS INVOLVING ALL BIOPSY  FRAGMENTS, CONSISTENT WITH  PATIENT'S KNOWN HISTORY OF CROHNS.  - FOCAL NON-NECROTIZING GRANULOMAS; AFB AND PASF STAINS ARE NEGATIVE;  STAIN CONTROLS WORKED APPROPRIATELY.  - NEGATIVE FOR DYSPLASIA AND MALIGNANCY.   B.  TERMINAL ILEUM; COLD BIOPSY:  - UNREMARKABLE SMALL INTESTINAL MUCOSA.  - NEGATIVE FOR ACTIVE ENTERITIS, FEATURES OF CHRONICITY, DYSPLASIA, AND  MALIGNANCY.   C.  COLON, LEFT; COLD BIOPSY:  - CHRONIC INACTIVE COLITIS INVOLVING A MINORITY OF THE BIOPSY FRAGMENTS,  CONSISTENT WITH PATIENT'S KNOWN HISTORY OF CROHNS.  - NEGATIVE FOR DYSPLASIA AND MALIGNANCY.    Colonoscopy 03/27/2021 - Stricture at the ileocecal valve. Biopsied. - Decreased mucosa vascular pattern in the cecum. Biopsied. - One 5 mm polyp in the sigmoid colon, removed with a cold snare. Resected and retrieved. - The rectum, sigmoid colon, descending colon, transverse colon and ascending colon are normal. Biopsied. DIAGNOSIS:  A. TERMINAL ILEUM; COLD BIOPSY:  - MILD CHRONIC ACTIVE ENTERITIS CONSISTENT WITH PATIENT'S HISTORY OF  CROHN'S DISEASE.  - NEGATIVE FOR DYSPLASIA AND MALIGNANCY.   B.  COLON, CECUM AND ASCENDING; COLD BIOPSY:  - MILD CHRONIC ACTIVE COLITIS CONSISTENT WITH PATIENT'S KNOWN HISTORY OF  CROHN'S.  - NEGATIVE FOR DYSPLASIA AND MALIGNANCY.   C.  COLON, TRANSVERSE; COLD BIOPSY:  - PATCHY CHRONIC INACTIVE COLITIS CONSISTENT WITH PATIENT'S HISTORY OF  CROHN'S.  - NEGATIVE FOR DYSPLASIA AND MALIGNANCY.   D.  COLON, DESCENDING; COLD BIOPSY:  - PATCHY CHRONIC INACTIVE COLITIS CONSISTENT WITH PATIENT'S HISTORY OF  CROHN'S.  - NEGATIVE FOR DYSPLASIA AND MALIGNANCY.   E.  COLON, RECTOSIGMOID; COLD BIOPSY:  - PATCHY CHRONIC INACTIVE COLITIS CONSISTENT WITH PATIENT'S HISTORY OF  CROHN'S.  - NEGATIVE FOR DYSPLASIA AND MALIGNANCY.    F.  COLON POLYP, SIGMOID; COLD SNARE:  - HYPERPLASTIC POLYP.  - NEGATIVE FOR DYSPLASIA AND MALIGNANCY.    Colonoscopy 08/28/2021 - Stricture in the terminal ileum. Biopsied. - Altered  vascular mucosa in the ascending colon and in the cecum. Biopsied. - The rectum, sigmoid colon, descending colon and transverse colon are normal. Biopsied. DIAGNOSIS:  A. TERMINAL ILEUM; COLD BIOPSY:  - CHRONIC ILEITIS WITH MILD ACTIVITY.  - NEGATIVE FOR GRANULOMA, DYSPLASIA, AND MALIGNANCY.   B. COLON, ASCENDING AND CECUM; COLD BIOPSY:  - CHRONIC COLITIS WITH MINIMAL ACTIVITY (FOCAL SUPERFICIAL CRYPTITIS).  - NEGATIVE FOR GRANULOMA, DYSPLASIA, AND MALIGNANCY.   C. COLON, TRANSVERSE; COLD BIOPSY:  - CHRONIC COLITIS WITHOUT ACTIVITY.  - NEGATIVE FOR GRANULOMA, DYSPLASIA, AND MALIGNANCY.   D. COLON, DESCENDING; COLD BIOPSY:  - CHRONIC COLITIS WITHOUT ACTIVITY.  - NEGATIVE FOR GRANULOMA, DYSPLASIA, AND MALIGNANCY.   E. COLON, RECTOSIGMOID; COLD BIOPSY:  - CHRONIC COLITIS WITHOUT ACTIVITY.  - NEGATIVE FOR GRANULOMA, DYSPLASIA, AND MALIGNANCY.   Colonoscopy 04/22/2022 - Patent end-to-side ileo-colonic anastomosis, characterized by healthy appearing mucosa. - The entire examined colon is normal. - The distal rectum and anal verge are normal on retroflexion view. - The examined portion of the ileum was normal. - No specimens collected.  Upper Endoscopy 04/22/2022 - Normal esophagus. - Normal stomach. - Normal examined duodenum. - No specimens collected.  Colonoscopy 07/23/2023 - The examined portion of the ileum was normal. Biopsied. - Patent end- to- side ileo- colonic anastomosis, characterized by inflammation. Biopsied. -  Aphtha in the descending colon and in the transverse colon. Biopsied. - Erythematous mucosa in the recto- sigmoid colon. Biopsied. - The rectum is normal. Biopsied. - The distal rectum and anal verge are normal on retroflexion view. 1. Terminal ileum, biopsy, Neo :      -  SMALL INTESTINAL MUCOSA WITH NO SIGNIFICANT PATHOLOGY.       2. Colon, biopsy, ileo colon anastomosis :      -  INTESTINAL (PREDOMINANTLY COLONIC TYPE) MUCOSA WITH REACTIVE/REPARATIVE       CHANGE AND FOCAL MINIMAL ACTIVITY CONSISTENT WITH ANASTOMOTIC SITE.       3. Transverse Colon Biopsy,  :      -  COLONIC MUCOSA WITH NO SIGNIFICANT PATHOLOGY.       4. Descending Colon Biopsy,  :      -  COLONIC MUCOSA WITH NO SIGNIFICANT PATHOLOGY.       5. Sigmoid Colon Biopsy,  :      -  COLONIC MUCOSA WITH FOCAL MINIMAL ACTIVITY AND REACTIVE/REPARATIVE CHANGE IN      THE BACKGROUND OF SEPARATE FRAGMENTS OF COLONIC MUCOSA WITH NO SIGNIFICANT      PATHOLOGY.       6. Sigmoid Colon Biopsy, Recto :      -  COLONIC MUCOSA WITH NO SIGNIFICANT PATHOLOGY.       7. Rectum, polyp(s),  :      -  COLONIC MUCOSA WITH NO SIGNIFICANT PATHOLOGY.      NOTE: THE CLINICAL HISTORY OF CROHN'S DISEASE IS NOTED.  WHILE THERE IS FOCAL      MINIMAL ACTIVITY IN THE ABOVE BIOPSIES, THERE IS NO DEFINITIVE EVIDENCE OF      CHRONICITY, GRANULOMAS, MORPHOLOGIC EVIDENCE OF VIRAL CYTOPATHIC EFFECT OR      DYSPLASIA.    VCE none   IBD medications:   Steroids: Prednisone, budesonide 5-ASA: None Immunomodulators: AZA, methotrexate TPMT status: normal Biologics:  Anti TNFs: Humira July 2021 to June 2022, restarted in January 2023, stopped in 12/2021 prior to ileocolonic resection Restarted Humira in 10/2022, stopped in 07/2023 she developed antibodies with postop recurrence of Crohn's disease Anti Integrins: Entyvio from June 2022 to October 2022, stopped secondary to mood changes Ustekinumab: Initiated in 07/2023 Tofactinib: Upadacitinib: Initiated in August 2023, patient self discontinued after 4 weeks of taking Rinvoq due to high blood pressure, cough and headaches Clinical trial:   NSAIDs: None   Antiplts/Anticoagulants/Anti thrombotics: None  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   Asthma    Used albuterol inhaler last Sunday   Crohn's disease (HCC)    Depression    GERD (gastroesophageal reflux disease)    Hypertension     Joint pain    secondary to Crohn's   Low back pain    Marital conflict    Migraine headache    approx 2x/month   Panic disorder    Thyromegaly    enlarged thyroid   Tobacco abuse 05/12/2018   Wears dentures    partial upper    Past Surgical History:  Procedure Laterality Date   CESAREAN SECTION  2003 and 2010   CHOLECYSTECTOMY  2007   COLONOSCOPY WITH PROPOFOL N/A 03/07/2020   Procedure: COLONOSCOPY WITH PROPOFOL;  Surgeon: Pasty Spillers, MD;  Location: [facility];  Service: Endoscopy;  Laterality: N/A;   COLONOSCOPY WITH PROPOFOL N/A 07/27/2020   Procedure: COLONOSCOPY WITH PROPOFOL;  Surgeon: Pasty Spillers,  MD;  Location: [facility];  Service: Endoscopy;  Laterality: N/A;   COLONOSCOPY WITH PROPOFOL N/A 03/27/2021   Procedure: COLONOSCOPY WITH PROPOFOL;  Surgeon: Pasty Spillers, MD;  Location: [facility];  Service: Endoscopy;  Laterality: N/A;   COLONOSCOPY WITH PROPOFOL N/A 08/28/2021   Procedure: COLONOSCOPY WITH PROPOFOL;  Surgeon: Pasty Spillers, MD;  Location: [facility];  Service: Endoscopy;  Laterality: N/A;   COLONOSCOPY WITH PROPOFOL N/A 04/22/2022   Procedure: COLONOSCOPY WITH PROPOFOL;  Surgeon: Toney Reil, MD;  Location: [facility];  Service: Endoscopy;  Laterality: N/A;   COLONOSCOPY WITH PROPOFOL N/A 07/23/2023   Procedure: COLONOSCOPY WITH PROPOFOL;  Surgeon: Toney Reil, MD;  Location: [facility];  Service: Endoscopy;  Laterality: N/A;   ESOPHAGOGASTRODUODENOSCOPY (EGD) WITH PROPOFOL N/A 04/22/2022   Procedure: ESOPHAGOGASTRODUODENOSCOPY (EGD) WITH PROPOFOL;  Surgeon: Toney Reil, MD;  Location: [facility];  Service: Endoscopy;  Laterality: N/A;   SMALL INTESTINE SURGERY     removed portion of small intestine   TUBAL LIGATION  2010     Current Outpatient Medications:    acetaminophen (TYLENOL) 500 MG tablet, Take 1-2 tablets (500 mg - 1 g total) by mouth every 6  hours as needed for pain. Prescription not provided, medication is available over the counter. Take as directed on label, Disp: , Rfl:    albuterol (VENTOLIN HFA) 108 (90 Base) MCG/ACT inhaler, INHALE 1-2 PUFFS BY MOUTH EVERY 6 HOURS AS NEEDED FOR WHEEZE OR SHORTNESS OF BREATH, Disp: 18 each, Rfl: 0   hydrOXYzine (VISTARIL) 25 MG capsule, Take 25 mg by mouth 2 (two) times daily., Disp: , Rfl:    lisinopril (ZESTRIL) 5 MG tablet, Take 1 tablet (5 mg total) by mouth daily., Disp: 90 tablet, Rfl: 1   lurasidone (LATUDA) 80 MG TABS tablet, Take 80 mg by mouth daily., Disp: , Rfl:    norethindrone (AYGESTIN) 5 MG tablet, Take 1 tablet (5 mg total) by mouth daily., Disp: 30 tablet, Rfl: 12   traZODone (DESYREL) 50 MG tablet, Take 100 mg by mouth at bedtime as needed., Disp: , Rfl:    ustekinumab (STELARA) 90 MG/ML SOSY injection, Inject 1 mL (90 mg total) into the skin every 8 (eight) weeks., Disp: 1 mL, Rfl: 7   Family History  Problem Relation Age of Onset   Kidney disease Mother    Hypertension Mother    Rheum arthritis Mother    Ankylosing spondylitis Mother    Hypertension Father    Diabetes Father    Breast cancer Maternal Aunt        early 59   Crohn's disease Maternal Aunt    Lupus Maternal Aunt    Heart attack Maternal Grandmother    Stroke Maternal Grandfather      Social History   Tobacco Use   Smoking status: Former    Current packs/day: 0.00    Average packs/day: 0.3 packs/day for 19.0 years (4.8 ttl pk-yrs)    Types: Cigarettes    Start date: 01/26/2003    Quit date: 01/25/2022    Years since quitting: 1.9   Smokeless tobacco: Never  Vaping Use   Vaping status: Former  Substance Use Topics   Alcohol use: Not Currently   Drug use: No    Allergies as of 12/30/2023 - Review Complete 12/30/2023  Allergen Reaction Noted   Morphine and codeine Itching 12/15/2015   Sulfa antibiotics Hives 12/15/2015    Review of Systems:    All systems reviewed  and negative except  where noted in HPI.   Physical Exam:  BP 129/80 (BP Location: Left Arm, Patient Position: Sitting, Cuff Size: Large)   Pulse 93   Temp 98 F (36.7 C) (Oral)   Ht 5' 4.5" (1.638 m)   Wt 235 lb 4 oz (106.7 kg)   BMI 39.76 kg/m  No LMP recorded. (Menstrual status: Oral contraceptives).  General:   Alert,  Well-developed, well-nourished, pleasant and cooperative in NAD Head:  Normocephalic and atraumatic. Eyes:  Sclera clear, no icterus.   Conjunctiva pink. Ears:  Normal auditory acuity. Nose:  No deformity, discharge, or lesions. Mouth:  No deformity or lesions,oropharynx pink & moist. Neck:  Supple; no masses or thyromegaly. Lungs:  Respirations even and unlabored.  Clear throughout to auscultation.   No wheezes, crackles, or rhonchi. No acute distress. Heart:  Regular rate and rhythm; no murmurs, clicks, rubs, or gallops. Abdomen:  Normal bowel sounds. Soft, non-tender and nondistended without masses, hepatosplenomegaly or hernias noted.  No guarding or rebound tenderness.   Rectal: Not performed Msk:  Symmetrical without gross deformities. Good, equal movement & strength bilaterally. Pulses:  Normal pulses noted. Extremities:  No clubbing or edema.  No cyanosis. Neurologic:  Alert and oriented x3;  grossly normal neurologically. Skin:  Intact without significant lesions or rashes. No jaundice. Psych:  Alert and cooperative. Normal mood and affect.  Imaging Studies: Reviewed  Assessment and Plan:   YANELIE ABRAHA is a 43 y.o. African-American female with history of ileocolonic Crohn's disease, stricturing phenotype diagnosed in May 2021, treated with budesonide, prednisone followed by Humira and temporarily on combination therapy with Imuran, later switched to [facility], switched back to Humira s/p laparoscopic ileocolic resection on 01/31/2022, with primary anastomosis. Colonoscopy in 03/2022 revealed no postop recurrence of Crohn's disease.  Humira reinitiated in January  2024, repeat colonoscopy in 06/2023 revealed postop recurrence of Crohn's disease at the ileocolic anastomosis, patchy aphthous ulcers in the descending and transverse colon.  However, pathology results did not reveal active disease.  Fecal calprotectin levels are elevated and she developed Humira antibodies.  Ileocolonic Crohn's disease with stricturing phenotype: S/p ileocolic resection in 01/2022 Switch from Humira to ustekinumab Risk and benefits of medication discussed including but not limited to infusion reaction, injection site reaction, allergic reaction, risk of infection, small risk of skin cancer Check CBC and CMP today Check fecal calprotectin levels  Obesity, Advised patient to follow strict healthy lifestyle, continue moderate level of intensity exercise and she will finally overcome migraine headaches  Chronic headache Thought to be multifactorial - Migraine with intractable headache without aura without status + component of occipital neuralgia + component of Ideopathic Intracranial Hypertension + catamenial component  She was started on topiramate 25 mg daily for 2 weeks at night followed by increased to 50 mg.  Patient reports that her anxiety levels were at peak on higher dose, therefore discontinued it  IBD Health Maintenance   1.TB status: QuantiFERON gold in 09/2021 negative 2. Anemia: None 3.Immunizations: Hep A antibody negative and B immune/reactive, Influenza recommend annual influenza vaccine, prevnar received, pneumovax received, Varicella unknown, Zoster recommend Shingrix vaccine, prescription sent, however patient did not receive Shingrix vaccine 4.Cancer screening I) Colon cancer/dysplasia surveillance: None II) Cervical cancer: Pap smear up-to-date III) Skin cancer - counseled about annual skin exam by dermatology and skin protection in summer using sun screen SPF > 50, clothing 5.Bone health Vitamin D status: Mild vitamin D deficiency, continue vitamin D  supplements Bone density testing: Not  performed 5. Labs: Today and every 3 months 6. Smoking: None 7. NSAIDs and Antibiotics use: None     Follow up in 4 months   Arlyss Repress, MD

## 2023-12-31 LAB — COMPREHENSIVE METABOLIC PANEL
ALT: 16 IU/L (ref 0–32)
AST: 15 IU/L (ref 0–40)
Albumin: 4.2 g/dL (ref 3.9–4.9)
Alkaline Phosphatase: 64 IU/L (ref 44–121)
BUN/Creatinine Ratio: 9 (ref 9–23)
BUN: 8 mg/dL (ref 6–24)
Bilirubin Total: 0.2 mg/dL (ref 0.0–1.2)
CO2: 21 mmol/L (ref 20–29)
Calcium: 9.6 mg/dL (ref 8.7–10.2)
Chloride: 107 mmol/L — ABNORMAL HIGH (ref 96–106)
Creatinine, Ser: 0.87 mg/dL (ref 0.57–1.00)
Globulin, Total: 3.1 g/dL (ref 1.5–4.5)
Glucose: 78 mg/dL (ref 70–99)
Potassium: 4.1 mmol/L (ref 3.5–5.2)
Sodium: 142 mmol/L (ref 134–144)
Total Protein: 7.3 g/dL (ref 6.0–8.5)
eGFR: 86 mL/min/{1.73_m2} (ref >59)

## 2023-12-31 LAB — CBC WITH DIFFERENTIAL/PLATELET
Basophils Absolute: 0 10*3/uL (ref 0.0–0.2)
Basos: 1 %
EOS (ABSOLUTE): 0.1 10*3/uL (ref 0.0–0.4)
Eos: 1 %
Hematocrit: 41.4 % (ref 34.0–46.6)
Hemoglobin: 13.6 g/dL (ref 11.1–15.9)
Immature Grans (Abs): 0 10*3/uL (ref 0.0–0.1)
Immature Granulocytes: 0 %
Lymphocytes Absolute: 2 10*3/uL (ref 0.7–3.1)
Lymphs: 26 %
MCH: 31.1 pg (ref 26.6–33.0)
MCHC: 32.9 g/dL (ref 31.5–35.7)
MCV: 95 fL (ref 79–97)
Monocytes Absolute: 0.4 10*3/uL (ref 0.1–0.9)
Monocytes: 5 %
Neutrophils Absolute: 5.1 10*3/uL (ref 1.4–7.0)
Neutrophils: 67 %
Platelets: 335 10*3/uL (ref 150–450)
RBC: 4.37 x10E6/uL (ref 3.77–5.28)
RDW: 12.1 % (ref 11.7–15.4)
WBC: 7.7 10*3/uL (ref 3.4–10.8)

## 2024-01-01 ENCOUNTER — Telehealth: Payer: Self-pay

## 2024-01-01 NOTE — Telephone Encounter (Signed)
-----   Message from [facility] sent at 01/01/2024  8:43 AM EST ----- Labs normal, recheck CBC and LFTs in 3 months  RV

## 2024-01-01 NOTE — Telephone Encounter (Signed)
 Put a reminder for 3 months and sent mychart message with results

## 2024-01-11 ENCOUNTER — Telehealth: Payer: Self-pay | Admitting: Family Medicine

## 2024-01-11 NOTE — Telephone Encounter (Signed)
 Patient dropped off document FMLA Forms for patients husband Vaughan Basta Brown)to care for her, to be filled out by provider. Patient requested to send it back via Call Patient to pick up within 5-days. Document is located in providers folder. Please advise at Mobile [phone] (mobile)

## 2024-01-12 ENCOUNTER — Other Ambulatory Visit: Payer: Self-pay | Admitting: Family Medicine

## 2024-01-13 NOTE — Telephone Encounter (Signed)
 D/C 12/30/23. Requested Prescriptions  Refused Prescriptions Disp Refills   QUEtiapine (SEROQUEL) 25 MG tablet [Pharmacy Med Name: QUETIAPINE FUMARATE 25 MG TAB] 180 tablet 1    Sig: TAKE 0.5-1 TABLETS (12.5-25 MG TOTAL) BY MOUTH 2 (TWO) TIMES DAILY.     Not Delegated - Psychiatry:  Antipsychotics - Second Generation (Atypical) - quetiapine Failed - 01/13/2024  9:32 AM      Failed - This refill cannot be delegated      Failed - Lipid Panel in normal range within the last 12 months    Cholesterol, Total  Date Value Ref Range Status  07/28/2023 165 100 - 199 mg/dL Final   LDL Chol Calc (NIH)  Date Value Ref Range Status  07/28/2023 107 (H) 0 - 99 mg/dL Final   HDL  Date Value Ref Range Status  07/28/2023 44 >39 mg/dL Final   Triglycerides  Date Value Ref Range Status  07/28/2023 71 0 - 149 mg/dL Final         Failed - CMP within normal limits and completed in the last 12 months    Albumin  Date Value Ref Range Status  12/30/2023 4.2 3.9 - 4.9 g/dL Final   Alkaline Phosphatase  Date Value Ref Range Status  12/30/2023 64 44 - 121 IU/L Final   ALT  Date Value Ref Range Status  12/30/2023 16 0 - 32 IU/L Final   AST  Date Value Ref Range Status  12/30/2023 15 0 - 40 IU/L Final   BUN  Date Value Ref Range Status  12/30/2023 8 6 - 24 mg/dL Final   Calcium  Date Value Ref Range Status  12/30/2023 9.6 8.7 - 10.2 mg/dL Final   CO2  Date Value Ref Range Status  12/30/2023 21 20 - 29 mmol/L Final   Bicarbonate  Date Value Ref Range Status  02/20/2018 19.2 (L) 20.0 - 28.0 mmol/L Final   Creatinine, Ser  Date Value Ref Range Status  12/30/2023 0.87 0.57 - 1.00 mg/dL Final   Glucose  Date Value Ref Range Status  12/30/2023 78 70 - 99 mg/dL Final   Glucose, Bld  Date Value Ref Range Status  01/09/2021 120 (H) 70 - 99 mg/dL Final    Comment:    Glucose reference range applies only to samples taken after fasting for at least 8 hours.   Potassium  Date Value Ref  Range Status  12/30/2023 4.1 3.5 - 5.2 mmol/L Final   Sodium  Date Value Ref Range Status  12/30/2023 142 134 - 144 mmol/L Final   Bilirubin Total  Date Value Ref Range Status  12/30/2023 0.2 0.0 - 1.2 mg/dL Final   Bilirubin, Direct  Date Value Ref Range Status  06/22/2023 0.11 0.00 - 0.40 mg/dL Final   Protein, ur  Date Value Ref Range Status  01/09/2021 NEGATIVE NEGATIVE mg/dL Final   Protein,UA  Date Value Ref Range Status  07/28/2023 Negative Negative/Trace Final   Total Protein  Date Value Ref Range Status  12/30/2023 7.3 6.0 - 8.5 g/dL Final   GFR calc Af Amer  Date Value Ref Range Status  10/16/2020 102 >59 mL/min/1.73 Final    Comment:    **In accordance with recommendations from the NKF-ASN Task force,**   [facility] is in the process of updating its eGFR calculation to the   2021 CKD-EPI creatinine equation that estimates kidney function   without a race variable.    eGFR  Date Value Ref Range Status  12/30/2023 86 >59 mL/min/1.73  Final   GFR, Estimated  Date Value Ref Range Status  01/09/2021 >60 >60 mL/min Final    Comment:    (NOTE) Calculated using the CKD-EPI Creatinine Equation (2021)          Passed - TSH in normal range and within 360 days    TSH  Date Value Ref Range Status  07/28/2023 1.430 0.450 - 4.500 uIU/mL Final         Passed - Completed PHQ-2 or PHQ-9 in the last 360 days      Passed - Last BP in normal range    BP Readings from Last 1 Encounters:  12/30/23 129/80         Passed - Last Heart Rate in normal range    Pulse Readings from Last 1 Encounters:  12/30/23 93         Passed - Valid encounter within last 6 months    Recent Outpatient Visits           5 months ago Routine general medical examination at a health care facility   [facility], Megan P, DO   11 months ago Essential hypertension   [place] [facility] [place], [place], DO   1 year ago Essential  hypertension   [place] [facility] [place], [place], DO   1 year ago Essential hypertension   [place] [facility] [place], Megan P, DO   1 year ago Moderate episode of recurrent major depressive disorder (HCC)   [place] [facility] [place], Megan P, DO       Future Appointments             In 1 week La[place] Benes, Oralia Rud, DO  [facility]            Passed - CBC within normal limits and completed in the last 12 months    WBC  Date Value Ref Range Status  12/30/2023 7.7 3.4 - 10.8 x10E3/uL Final  01/09/2021 14.4 (H) 4.0 - 10.5 K/uL Final   RBC  Date Value Ref Range Status  12/30/2023 4.37 3.77 - 5.28 x10E6/uL Final  01/09/2021 4.20 3.87 - 5.11 MIL/uL Final   Hemoglobin  Date Value Ref Range Status  12/30/2023 13.6 11.1 - 15.9 g/dL Final   Hematocrit  Date Value Ref Range Status  12/30/2023 41.4 34.0 - 46.6 % Final   MCHC  Date Value Ref Range Status  12/30/2023 32.9 31.5 - 35.7 g/dL Final  16/07/9603 54.0 30.0 - 36.0 g/dL Final   [facility]  Date Value Ref Range Status  12/30/2023 31.1 26.6 - 33.0 pg Final  01/09/2021 31.7 26.0 - 34.0 pg Final   MCV  Date Value Ref Range Status  12/30/2023 95 79 - 97 fL Final   No results found for: "PLTCOUNTKUC", "LABPLAT", "POCPLA" RDW  Date Value Ref Range Status  12/30/2023 12.1 11.7 - 15.4 % Final

## 2024-01-18 ENCOUNTER — Encounter: Payer: Self-pay | Admitting: Family Medicine

## 2024-01-20 NOTE — Telephone Encounter (Signed)
 Patient scheduled for appointment with form completion.

## 2024-01-26 ENCOUNTER — Ambulatory Visit: Attending: Family Medicine

## 2024-01-26 ENCOUNTER — Encounter: Payer: Self-pay | Admitting: Family Medicine

## 2024-01-26 ENCOUNTER — Telehealth: Payer: Self-pay

## 2024-01-26 ENCOUNTER — Ambulatory Visit: Payer: Medicaid Other | Admitting: Family Medicine

## 2024-01-26 VITALS — BP 125/84 | HR 91 | Temp 98.2°F | Ht 64.5 in | Wt 234.2 lb

## 2024-01-26 DIAGNOSIS — K50819 Crohn's disease of both small and large intestine with unspecified complications: Secondary | ICD-10-CM | POA: Diagnosis not present

## 2024-01-26 DIAGNOSIS — R002 Palpitations: Secondary | ICD-10-CM

## 2024-01-26 DIAGNOSIS — I10 Essential (primary) hypertension: Secondary | ICD-10-CM

## 2024-01-26 MED ORDER — LISINOPRIL 5 MG PO TABS: 5.0000 mg | ORAL_TABLET | Freq: Every day | ORAL | 1 refills | Status: AC

## 2024-01-26 MED ORDER — WEGOVY 0.25 MG/0.5ML ~~LOC~~ SOAJ: 0.2500 mg | SUBCUTANEOUS | 1 refills | Status: DC

## 2024-01-26 NOTE — Telephone Encounter (Signed)
 PA for [facility] initiated and submitted via Cover My Meds. Key: [id number]

## 2024-01-26 NOTE — Assessment & Plan Note (Signed)
 Under good control on current regimen. Continue current regimen. Continue to monitor. Call with any concerns. Refills given. Labs drawn today.

## 2024-01-26 NOTE — Assessment & Plan Note (Signed)
 She has been engaging in an exercise routine and portion controlled diet, which she will continue. She would benefit from wegovy- will start. Recheck 6 weeks.

## 2024-01-26 NOTE — Progress Notes (Signed)
 BP 125/84   Pulse 91   Temp 98.2 F (36.8 C)   Ht 5' 4.5" (1.638 m)   Wt 234 lb 3.2 oz (106.2 kg)   SpO2 98%   BMI 39.58 kg/m    Subjective:    Patient ID: Cheryl Flowers, female    DOB: [birth year], 43 y.o.   MRN: [id number]  HPI: Cheryl Flowers is a 43 y.o. female  Chief Complaint  Patient presents with   Hypertension   Anxiety   HYPERTENSION  Hypertension status: controlled  Satisfied with current treatment? yes Duration of hypertension: chronic BP monitoring frequency:  not checking BP medication side effects:  no Medication compliance: excellent compliance Previous BP meds:lisinopril Aspirin: no Recurrent headaches: no Visual changes: no Palpitations: no Dyspnea: no Chest pain: no Lower extremity edema: no Dizzy/lightheaded: yes  PALPITATIONS Duration: 3 weeks ago Symptom description: flipping, fluttering Duration of episode: constant until last week Activity when event occurred:  at random Related to exertion: yes Dyspnea: yes Chest pain: no Syncope: no Anxiety/stress: yes Nausea/vomiting: no Diaphoresis: no Coronary artery disease: no Congestive heart failure: no Arrhythmia:no Thyroid disease: no Caffeine intake: none Status:  better Treatments attempted:none  OBESITY Duration: chronic Previous attempts at weight loss: has been doing portion control, avoiding calorie drinks, walking for about a year  Complications of obesity: HTN, depression, low back pain Peak weight: 238lbs Weight loss goal: 170lbs Weight loss to date: 4 lbs Requesting obesity pharmacotherapy: no Current weight loss supplements/medications: no Previous weight loss supplements/meds: no  Clinical coverage for weight loss GLP's   Medication being dispensed is Wegovy 3 mL/28 day. Titration doses are 2 mL/28 days.   [x]  Product being prescribed is FDA approved for the indication, age, weight (if applicable) and not does not exceed dosing limits per  the Prescribing Information per the clinical conditions for use.  [x]  Patient's baseline weight measured within the last 45 days as required by provider before dispensing.  [x]  Patient is new to therapy and One of the following:   [x]  The beneficiary is 42 years of age or over and has ONE of the following:  [x]  A BMI greater than or equal to 30 kg/m2  [x]  A BMI greater than or equal to 27 kg/m2 with at least one weight-related comorbidity/risk factor/complication (i.e. hypertension, type 2 diabetes, obstructive sleep apnea, cardiovascular disease, dyslipidemia)  [x]  If patient has one weight-related comorbidity/risk factor/complication (i.e. hypertension, type 2 diabetes, obstructive sleep apnea, cardiovascular disease, dyslipidemia), please list  Patient suffers from weight-related comorbidity/risk factor/complication  HTN, depression, low back pain  [x]  The beneficiary is currently on and will continue lifestyle modification including structured nutrition and physical activity, unless physical activity is not clinically appropriate at the time GLP1 therapy commences AND  [x]  The beneficiary will NOT be using the requested agent in combination with another GLP-1 receptor agonist agent AND  [x]  The beneficiary does NOT have any FDA-labeled contraindications to the requested agent, including pregnancy, lactation, history of medullary thyroid cancer or multiple endocrine neoplasia type II.   Last BMI/Weight/Height recorded Estimated body mass index is 39.58 kg/m as calculated from the following:   Height as of this encounter: 5' 4.5" (1.638 m).   Weight as of this encounter: 234 lb 3.2 oz (106.2 kg).      Relevant past medical, surgical, family and social history reviewed and updated as indicated. Interim medical history since our last visit reviewed. Allergies and medications reviewed and updated.  Review of Systems  Constitutional:  Negative.   Respiratory: Negative.     Cardiovascular: Negative.   Gastrointestinal: Negative.   Musculoskeletal:  Positive for arthralgias and back pain. Negative for gait problem, joint swelling, myalgias, neck pain and neck stiffness.  Skin: Negative.   Neurological: Negative.   Psychiatric/Behavioral: Negative.      Per HPI unless specifically indicated above     Objective:    BP 125/84   Pulse 91   Temp 98.2 F (36.8 C)   Ht 5' 4.5" (1.638 m)   Wt 234 lb 3.2 oz (106.2 kg)   SpO2 98%   BMI 39.58 kg/m   Wt Readings from Last 3 Encounters:  01/26/24 234 lb 3.2 oz (106.2 kg)  12/30/23 235 lb 4 oz (106.7 kg)  07/30/23 226 lb 8 oz (102.7 kg)    Physical Exam Vitals and nursing note reviewed.  Constitutional:      General: She is not in acute distress.    Appearance: Normal appearance. She is obese. She is not ill-appearing, toxic-appearing or diaphoretic.  HENT:     Head: Normocephalic and atraumatic.     Right Ear: External ear normal.     Left Ear: External ear normal.     Nose: Nose normal.     Mouth/Throat:     Mouth: Mucous membranes are moist.     Pharynx: Oropharynx is clear.  Eyes:     General: No scleral icterus.       Right eye: No discharge.        Left eye: No discharge.     Extraocular Movements: Extraocular movements intact.     Conjunctiva/sclera: Conjunctivae normal.     Pupils: Pupils are equal, round, and reactive to light.  Cardiovascular:     Rate and Rhythm: Normal rate and regular rhythm.     Pulses: Normal pulses.     Heart sounds: Normal heart sounds. No murmur heard.    No friction rub. No gallop.  Pulmonary:     Effort: Pulmonary effort is normal. No respiratory distress.     Breath sounds: Normal breath sounds. No stridor. No wheezing, rhonchi or rales.  Chest:     Chest wall: No tenderness.  Musculoskeletal:        General: Normal range of motion.     Cervical back: Normal range of motion and neck supple.  Skin:    General: Skin is warm and dry.     Capillary  Refill: Capillary refill takes less than 2 seconds.     Coloration: Skin is not jaundiced or pale.     Findings: No bruising, erythema, lesion or rash.  Neurological:     General: No focal deficit present.     Mental Status: She is alert and oriented to person, place, and time. Mental status is at baseline.  Psychiatric:        Mood and Affect: Mood normal.        Behavior: Behavior normal.        Thought Content: Thought content normal.        Judgment: Judgment normal.     Results for orders placed or performed in visit on 12/30/23  CBC with Differential   Collection Time: 12/30/23  2:25 PM  Result Value Ref Range   WBC 7.7 3.4 - 10.8 x10E3/uL   RBC 4.37 3.77 - 5.28 x10E6/uL   Hemoglobin 13.6 11.1 - 15.9 g/dL   Hematocrit 16.1 09.6 - 46.6 %   MCV 95 79 - 97 fL  MCH 31.1 26.6 - 33.0 pg   MCHC 32.9 31.5 - 35.7 g/dL   RDW 16.1 09.6 - 04.5 %   Platelets 335 150 - 450 x10E3/uL   Neutrophils 67 Not Estab. %   Lymphs 26 Not Estab. %   Monocytes 5 Not Estab. %   Eos 1 Not Estab. %   Basos 1 Not Estab. %   Neutrophils Absolute 5.1 1.4 - 7.0 x10E3/uL   Lymphocytes Absolute 2.0 0.7 - 3.1 x10E3/uL   Monocytes Absolute 0.4 0.1 - 0.9 x10E3/uL   EOS (ABSOLUTE) 0.1 0.0 - 0.4 x10E3/uL   Basophils Absolute 0.0 0.0 - 0.2 x10E3/uL   Immature Granulocytes 0 Not Estab. %   Immature Grans (Abs) 0.0 0.0 - 0.1 x10E3/uL  Comprehensive Metabolic Panel (CMET)   Collection Time: 12/30/23  2:25 PM  Result Value Ref Range   Glucose 78 70 - 99 mg/dL   BUN 8 6 - 24 mg/dL   Creatinine, Ser 4.09 0.57 - 1.00 mg/dL   eGFR 86 >81 XB/JYN/8.29   BUN/Creatinine Ratio 9 9 - 23   Sodium 142 134 - 144 mmol/L   Potassium 4.1 3.5 - 5.2 mmol/L   Chloride 107 (H) 96 - 106 mmol/L   CO2 21 20 - 29 mmol/L   Calcium 9.6 8.7 - 10.2 mg/dL   Total Protein 7.3 6.0 - 8.5 g/dL   Albumin 4.2 3.9 - 4.9 g/dL   Globulin, Total 3.1 1.5 - 4.5 g/dL   Bilirubin Total 0.2 0.0 - 1.2 mg/dL   Alkaline Phosphatase 64 44 - 121  IU/L   AST 15 0 - 40 IU/L   ALT 16 0 - 32 IU/L      Assessment & Plan:   Problem List Items Addressed This Visit       Cardiovascular and Mediastinum   Essential hypertension - Primary   Under good control on current regimen. Continue current regimen. Continue to monitor. Call with any concerns. Refills given. Labs drawn today.        Relevant Medications   lisinopril (ZESTRIL) 5 MG tablet   Other Relevant Orders   Basic metabolic panel with GFR     Other   Morbid obesity (HCC)   She has been engaging in an exercise routine and portion controlled diet, which she will continue. She would benefit from wegovy- will start. Recheck 6 weeks.       Relevant Medications   Semaglutide-Weight Management (WEGOVY) 0.25 MG/0.5ML SOAJ   Other Visit Diagnoses       Crohn's disease of small and large intestines with complication (HCC)       Would like to drop off her stool test for GI- will forward results when they come in.   Relevant Orders   Calprotectin, Fecal     Palpitations       Will check labs. EKG normal today. Will get her set up for zio. Call with any concerns.   Relevant Orders   CBC with Differential/Platelet   TSH   LONG TERM MONITOR (3-14 DAYS)   EKG 12-Lead        Follow up plan: Return in about 6 weeks (around 03/08/2024).

## 2024-01-27 LAB — BASIC METABOLIC PANEL WITH GFR
BUN/Creatinine Ratio: 9 (ref 9–23)
BUN: 8 mg/dL (ref 6–24)
CO2: 19 mmol/L — ABNORMAL LOW (ref 20–29)
Calcium: 9.6 mg/dL (ref 8.7–10.2)
Chloride: 105 mmol/L (ref 96–106)
Creatinine, Ser: 0.91 mg/dL (ref 0.57–1.00)
Glucose: 85 mg/dL (ref 70–99)
Potassium: 4.1 mmol/L (ref 3.5–5.2)
Sodium: 140 mmol/L (ref 134–144)
eGFR: 81 mL/min/{1.73_m2} (ref >59)

## 2024-01-27 LAB — CBC WITH DIFFERENTIAL/PLATELET
Basophils Absolute: 0.1 10*3/uL (ref 0.0–0.2)
Basos: 1 %
EOS (ABSOLUTE): 0.1 10*3/uL (ref 0.0–0.4)
Eos: 1 %
Hematocrit: 42.5 % (ref 34.0–46.6)
Hemoglobin: 13.9 g/dL (ref 11.1–15.9)
Immature Grans (Abs): 0 10*3/uL (ref 0.0–0.1)
Immature Granulocytes: 0 %
Lymphocytes Absolute: 3.1 10*3/uL (ref 0.7–3.1)
Lymphs: 41 %
MCH: 31.3 pg (ref 26.6–33.0)
MCHC: 32.7 g/dL (ref 31.5–35.7)
MCV: 96 fL (ref 79–97)
Monocytes Absolute: 0.4 10*3/uL (ref 0.1–0.9)
Monocytes: 6 %
Neutrophils Absolute: 3.8 10*3/uL (ref 1.4–7.0)
Neutrophils: 51 %
Platelets: 345 10*3/uL (ref 150–450)
RBC: 4.44 x10E6/uL (ref 3.77–5.28)
RDW: 12.2 % (ref 11.7–15.4)
WBC: 7.5 10*3/uL (ref 3.4–10.8)

## 2024-01-27 LAB — TSH: TSH: 1.34 u[IU]/mL (ref 0.450–4.500)

## 2024-01-28 NOTE — Telephone Encounter (Signed)
 PA has been approved. Patient notified via Mychart message.

## 2024-01-29 ENCOUNTER — Encounter: Payer: Self-pay | Admitting: Family Medicine

## 2024-02-04 DIAGNOSIS — F900 Attention-deficit hyperactivity disorder, predominantly inattentive type: Secondary | ICD-10-CM | POA: Diagnosis not present

## 2024-02-04 DIAGNOSIS — F3162 Bipolar disorder, current episode mixed, moderate: Secondary | ICD-10-CM | POA: Diagnosis not present

## 2024-02-04 DIAGNOSIS — G4733 Obstructive sleep apnea (adult) (pediatric): Secondary | ICD-10-CM | POA: Diagnosis not present

## 2024-02-04 DIAGNOSIS — F411 Generalized anxiety disorder: Secondary | ICD-10-CM | POA: Diagnosis not present

## 2024-02-04 DIAGNOSIS — G5602 Carpal tunnel syndrome, left upper limb: Secondary | ICD-10-CM | POA: Diagnosis not present

## 2024-02-04 DIAGNOSIS — E559 Vitamin D deficiency, unspecified: Secondary | ICD-10-CM | POA: Diagnosis not present

## 2024-02-04 DIAGNOSIS — G932 Benign intracranial hypertension: Secondary | ICD-10-CM | POA: Diagnosis not present

## 2024-02-04 DIAGNOSIS — G43119 Migraine with aura, intractable, without status migrainosus: Secondary | ICD-10-CM | POA: Diagnosis not present

## 2024-02-14 NOTE — Progress Notes (Signed)
 No show

## 2024-02-15 ENCOUNTER — Telehealth: Payer: Self-pay | Admitting: Internal Medicine

## 2024-02-15 ENCOUNTER — Ambulatory Visit: Admitting: Internal Medicine

## 2024-02-15 NOTE — Telephone Encounter (Signed)
 Patient lvm to r/s today's appointment. No answer when I returned her call. Lvm to call [facility] @ [phone] to r/s-Toni

## 2024-02-19 DIAGNOSIS — R002 Palpitations: Secondary | ICD-10-CM | POA: Diagnosis not present

## 2024-02-22 ENCOUNTER — Other Ambulatory Visit: Payer: Self-pay | Admitting: Family Medicine

## 2024-02-22 ENCOUNTER — Encounter: Payer: Self-pay | Admitting: Family Medicine

## 2024-02-22 MED ORDER — METOPROLOL SUCCINATE ER 25 MG PO TB24: 12.5000 mg | ORAL_TABLET | Freq: Every day | ORAL | 1 refills | Status: DC

## 2024-02-23 DIAGNOSIS — F900 Attention-deficit hyperactivity disorder, predominantly inattentive type: Secondary | ICD-10-CM | POA: Diagnosis not present

## 2024-02-23 DIAGNOSIS — F411 Generalized anxiety disorder: Secondary | ICD-10-CM | POA: Diagnosis not present

## 2024-02-23 DIAGNOSIS — F3162 Bipolar disorder, current episode mixed, moderate: Secondary | ICD-10-CM | POA: Diagnosis not present

## 2024-03-16 ENCOUNTER — Encounter: Payer: Self-pay | Admitting: Family Medicine

## 2024-03-16 ENCOUNTER — Ambulatory Visit: Admitting: Family Medicine

## 2024-03-16 VITALS — BP 115/76 | HR 82 | Temp 98.6°F | Ht 64.5 in | Wt 228.4 lb

## 2024-03-16 DIAGNOSIS — K50012 Crohn's disease of small intestine with intestinal obstruction: Secondary | ICD-10-CM

## 2024-03-16 DIAGNOSIS — R002 Palpitations: Secondary | ICD-10-CM

## 2024-03-16 MED ORDER — WEGOVY 0.5 MG/0.5ML ~~LOC~~ SOAJ: 0.5000 mg | SUBCUTANEOUS | 1 refills | Status: DC

## 2024-03-16 MED ORDER — METOPROLOL SUCCINATE ER 25 MG PO TB24: 25.0000 mg | ORAL_TABLET | Freq: Every day | ORAL | 1 refills | Status: AC

## 2024-03-16 NOTE — Progress Notes (Signed)
 BP 115/76   Pulse 82   Temp 98.6 F (37 C) (Oral)   Ht 5' 4.5" (1.638 m)   Wt 228 lb 6.4 oz (103.6 kg)   SpO2 98%   BMI 38.60 kg/m    Subjective:    Patient ID: Cheryl Flowers, female    DOB: 27-Oct-1981, 43 y.o.   MRN: [id number]  HPI: Cheryl Flowers is a 43 y.o. female  Chief Complaint  Patient presents with   Weight Check   Palpitations        OBESITY Duration: chronic Previous attempts at weight loss: has been doing portion control, avoiding calorie drinks, walking for about a year  Complications of obesity: HTN, depression, low back pain Peak weight: 238lbs Weight loss goal: 170lbs Weight loss to date: 10 lbs Requesting obesity pharmacotherapy: yes Current weight loss supplements/medications: yes Previous weight loss supplements/meds: no  PALPITATIONS Duration: months Symptom description: flipping, fluttering Duration of episode: seconds Frequency: rarely Activity when event occurred: at random Related to exertion: yes Dyspnea: no Chest pain: no Syncope: no Anxiety/stress: yes Nausea/vomiting: no Diaphoresis: no Coronary artery disease: no Congestive heart failure: no Arrhythmia:no Thyroid  disease: no Caffeine intake: none Status:  better Treatments attempted:metoprolol   Relevant past medical, surgical, family and social history reviewed and updated as indicated. Interim medical history since our last visit reviewed. Allergies and medications reviewed and updated.  Review of Systems  Constitutional: Negative.   Respiratory: Negative.    Cardiovascular: Negative.   Gastrointestinal: Negative.   Musculoskeletal: Negative.   Neurological: Negative.   Psychiatric/Behavioral: Negative.      Per HPI unless specifically indicated above     Objective:     BP 115/76   Pulse 82   Temp 98.6 F (37 C) (Oral)   Ht 5' 4.5" (1.638 m)   Wt 228 lb 6.4 oz (103.6 kg)   SpO2 98%   BMI 38.60 kg/m   Wt Readings from Last 3  Encounters:  03/16/24 228 lb 6.4 oz (103.6 kg)  01/26/24 234 lb 3.2 oz (106.2 kg)  12/30/23 235 lb 4 oz (106.7 kg)    Physical Exam Vitals and nursing note reviewed.  Constitutional:      General: She is not in acute distress.    Appearance: Normal appearance. She is not ill-appearing, toxic-appearing or diaphoretic.  HENT:     Head: Normocephalic and atraumatic.     Right Ear: External ear normal.     Left Ear: External ear normal.     Nose: Nose normal.     Mouth/Throat:     Mouth: Mucous membranes are moist.     Pharynx: Oropharynx is clear.  Eyes:     General: No scleral icterus.       Right eye: No discharge.        Left eye: No discharge.     Extraocular Movements: Extraocular movements intact.     Conjunctiva/sclera: Conjunctivae normal.     Pupils: Pupils are equal, round, and reactive to light.  Cardiovascular:     Rate and Rhythm: Normal rate and regular rhythm.     Pulses: Normal pulses.     Heart sounds: Normal heart sounds. No murmur heard.    No friction rub. No gallop.  Pulmonary:     Effort: Pulmonary effort is normal. No respiratory distress.     Breath sounds: Normal breath sounds. No stridor. No wheezing, rhonchi or rales.  Chest:     Chest wall: No tenderness.  Musculoskeletal:  General: Normal range of motion.     Cervical back: Normal range of motion and neck supple.  Skin:    General: Skin is warm and dry.     Capillary Refill: Capillary refill takes less than 2 seconds.     Coloration: Skin is not jaundiced or pale.     Findings: No bruising, erythema, lesion or rash.  Neurological:     General: No focal deficit present.     Mental Status: She is alert and oriented to person, place, and time. Mental status is at baseline.  Psychiatric:        Mood and Affect: Mood normal.        Behavior: Behavior normal.        Thought Content: Thought content normal.        Judgment: Judgment normal.     Results for orders placed or performed in  visit on 01/26/24  Basic metabolic panel with GFR   Collection Time: 01/26/24  8:44 AM  Result Value Ref Range   Glucose 85 70 - 99 mg/dL   BUN 8 6 - 24 mg/dL   Creatinine, Ser 1.61 0.57 - 1.00 mg/dL   eGFR 81 >09 UE/AVW/0.98   BUN/Creatinine Ratio 9 9 - 23   Sodium 140 134 - 144 mmol/L   Potassium 4.1 3.5 - 5.2 mmol/L   Chloride 105 96 - 106 mmol/L   CO2 19 (L) 20 - 29 mmol/L   Calcium 9.6 8.7 - 10.2 mg/dL  CBC with Differential/Platelet   Collection Time: 01/26/24  8:44 AM  Result Value Ref Range   WBC 7.5 3.4 - 10.8 x10E3/uL   RBC 4.44 3.77 - 5.28 x10E6/uL   Hemoglobin 13.9 11.1 - 15.9 g/dL   Hematocrit 11.9 14.7 - 46.6 %   MCV 96 79 - 97 fL   MCH 31.3 26.6 - 33.0 pg   MCHC 32.7 31.5 - 35.7 g/dL   RDW 82.9 56.2 - 13.0 %   Platelets 345 150 - 450 x10E3/uL   Neutrophils 51 Not Estab. %   Lymphs 41 Not Estab. %   Monocytes 6 Not Estab. %   Eos 1 Not Estab. %   Basos 1 Not Estab. %   Neutrophils Absolute 3.8 1.4 - 7.0 x10E3/uL   Lymphocytes Absolute 3.1 0.7 - 3.1 x10E3/uL   Monocytes Absolute 0.4 0.1 - 0.9 x10E3/uL   EOS (ABSOLUTE) 0.1 0.0 - 0.4 x10E3/uL   Basophils Absolute 0.1 0.0 - 0.2 x10E3/uL   Immature Granulocytes 0 Not Estab. %   Immature Grans (Abs) 0.0 0.0 - 0.1 x10E3/uL  TSH   Collection Time: 01/26/24  8:44 AM  Result Value Ref Range   TSH 1.340 0.450 - 4.500 uIU/mL      Assessment & Plan:   Problem List Items Addressed This Visit       Digestive   Crohn's disease of small intestine with intestinal obstruction (HCC) - Primary   Labs drawn for GI. Will forward when they return.       Relevant Orders   Calprotectin, Fecal     Other   Morbid obesity (HCC)   Congratulated patient on 10lbs weight loss! Continue diet and exercise. Will continue to titrate up on her wegovy . Currently at 4.2% of her body weight down. Recheck in a couple of months.       Relevant Medications   Semaglutide -Weight Management (WEGOVY ) 0.5 MG/0.5ML SOAJ   Other Visit  Diagnoses       Palpitations  Resolved with metoprolol . Tolerating metoprolol  well. No concerns. Continue to monitor.        Follow up plan: Return in about 6 weeks (around 04/27/2024).

## 2024-03-17 NOTE — Assessment & Plan Note (Signed)
 Congratulated patient on 10lbs weight loss! Continue diet and exercise. Will continue to titrate up on her wegovy . Currently at 4.2% of her body weight down. Recheck in a couple of months.

## 2024-03-17 NOTE — Assessment & Plan Note (Signed)
 Labs drawn for GI. Will forward when they return.

## 2024-03-19 LAB — CALPROTECTIN, FECAL: Calprotectin, Fecal: 57 ug/g (ref 0–120)

## 2024-03-22 ENCOUNTER — Ambulatory Visit: Payer: Self-pay | Admitting: Gastroenterology

## 2024-03-31 DIAGNOSIS — F411 Generalized anxiety disorder: Secondary | ICD-10-CM | POA: Diagnosis not present

## 2024-03-31 DIAGNOSIS — F3162 Bipolar disorder, current episode mixed, moderate: Secondary | ICD-10-CM | POA: Diagnosis not present

## 2024-03-31 DIAGNOSIS — F900 Attention-deficit hyperactivity disorder, predominantly inattentive type: Secondary | ICD-10-CM | POA: Diagnosis not present

## 2024-04-20 DIAGNOSIS — F900 Attention-deficit hyperactivity disorder, predominantly inattentive type: Secondary | ICD-10-CM | POA: Diagnosis not present

## 2024-04-20 DIAGNOSIS — F411 Generalized anxiety disorder: Secondary | ICD-10-CM | POA: Diagnosis not present

## 2024-04-20 DIAGNOSIS — F3162 Bipolar disorder, current episode mixed, moderate: Secondary | ICD-10-CM | POA: Diagnosis not present

## 2024-04-28 ENCOUNTER — Ambulatory Visit: Admitting: Family Medicine

## 2024-05-03 ENCOUNTER — Other Ambulatory Visit: Payer: Self-pay | Admitting: Family Medicine

## 2024-05-05 NOTE — Telephone Encounter (Signed)
 Requested medication (s) are due for refill today: yes  Requested medication (s) are on the active medication list: yes  Last refill:  03/16/24 #2/1  Future visit scheduled: yes  Notes to clinic:  Unable to refill per protocol due to failed labs, no updated results.      Requested Prescriptions  Pending Prescriptions Disp Refills   WEGOVY  0.5 MG/0.5ML SOAJ [Pharmacy Med Name: WEGOVY  0.5 MG/0.5 ML PEN]  1    Sig: INJECT 0.5 MG INTO THE SKIN ONE TIME PER WEEK     Endocrinology:  Diabetes - GLP-1 Receptor Agonists - semaglutide  Failed - 05/05/2024  4:22 PM      Failed - HBA1C in normal range and within 180 days    HB A1C (BAYER DCA - WAIVED)  Date Value Ref Range Status  04/05/2019 5.1 <7.0 % Final    Comment:                                          Diabetic Adult            <7.0                                       Healthy Adult        4.3 - 5.7                                                           (DCCT/NGSP) American Diabetes Association's Summary of Glycemic Recommendations for Adults with Diabetes: Hemoglobin A1c <7.0%. More stringent glycemic goals (A1c <6.0%) may further reduce complications at the cost of increased risk of hypoglycemia.          Passed - Cr in normal range and within 360 days    Creatinine, Ser  Date Value Ref Range Status  01/26/2024 0.91 0.57 - 1.00 mg/dL Final         Passed - Valid encounter within last 6 months    Recent Outpatient Visits           1 month ago Crohn's disease of small intestine with intestinal obstruction (HCC)   [place] [place], Megan P, DO   3 months ago Essential hypertension   [facility] [place], [place], DO

## 2024-05-09 DIAGNOSIS — G43119 Migraine with aura, intractable, without status migrainosus: Secondary | ICD-10-CM | POA: Diagnosis not present

## 2024-05-10 ENCOUNTER — Ambulatory Visit: Admitting: Family Medicine

## 2024-05-10 ENCOUNTER — Encounter: Payer: Self-pay | Admitting: Family Medicine

## 2024-05-10 MED ORDER — WEGOVY 1 MG/0.5ML ~~LOC~~ SOAJ: 1.0000 mg | SUBCUTANEOUS | 1 refills | Status: AC

## 2024-05-10 NOTE — Assessment & Plan Note (Signed)
 Weight is stable with the addition of mirtazapine with her psychiatrist. Will increase her wegovy  to 1mg  and recheck in 1 month. Call with any concerns.

## 2024-05-10 NOTE — Progress Notes (Signed)
 BP 131/83 (BP Location: Left Arm, Patient Position: Sitting, Cuff Size: Large)   Pulse (!) 103   Temp 98.2 F (36.8 C) (Oral)   Resp 15   Ht 5' 4.49 (1.638 m)   Wt 230 lb 3.2 oz (104.4 kg)   LMP  (LMP Unknown)   SpO2 97%   BMI 38.92 kg/m    Subjective:    Patient ID: Cheryl Flowers, female    DOB: July 12, 1981, 43 y.o.   MRN: [id number]  HPI: Cheryl Flowers is a 43 y.o. female  Chief Complaint  Patient presents with   Obesity    Would like to consider upping dose as she wonders if she has plateau.    OBESITY Duration: chronic Previous attempts at weight loss: has been doing portion control, avoiding calorie drinks, walking for about a year  Complications of obesity: HTN, depression, low back pain Peak weight: 238lbs Weight loss goal: 170lbs Weight loss to date: 8 lbs Requesting obesity pharmacotherapy: yes Current weight loss supplements/medications: yes Previous weight loss supplements/meds: no  Relevant past medical, surgical, family and social history reviewed and updated as indicated. Interim medical history since our last visit reviewed. Allergies and medications reviewed and updated.  Review of Systems  Constitutional: Negative.   Respiratory: Negative.    Cardiovascular: Negative.   Gastrointestinal:  Positive for constipation. Negative for abdominal distention, abdominal pain, anal bleeding, blood in stool, diarrhea, nausea, rectal pain and vomiting.  Musculoskeletal: Negative.   Psychiatric/Behavioral: Negative.      Per HPI unless specifically indicated above     Objective:    BP 131/83 (BP Location: Left Arm, Patient Position: Sitting, Cuff Size: Large)   Pulse (!) 103   Temp 98.2 F (36.8 C) (Oral)   Resp 15   Ht 5' 4.49 (1.638 m)   Wt 230 lb 3.2 oz (104.4 kg)   LMP  (LMP Unknown)   SpO2 97%   BMI 38.92 kg/m   Wt Readings from Last 3 Encounters:  05/10/24 230 lb 3.2 oz (104.4 kg)  03/16/24 228 lb 6.4 oz (103.6 kg)   01/26/24 234 lb 3.2 oz (106.2 kg)    Physical Exam Vitals and nursing note reviewed.  Constitutional:      General: She is not in acute distress.    Appearance: Normal appearance. She is not ill-appearing, toxic-appearing or diaphoretic.  HENT:     Head: Normocephalic and atraumatic.     Right Ear: External ear normal.     Left Ear: External ear normal.     Nose: Nose normal.     Mouth/Throat:     Mouth: Mucous membranes are moist.     Pharynx: Oropharynx is clear.  Eyes:     General: No scleral icterus.       Right eye: No discharge.        Left eye: No discharge.     Extraocular Movements: Extraocular movements intact.     Conjunctiva/sclera: Conjunctivae normal.     Pupils: Pupils are equal, round, and reactive to light.  Cardiovascular:     Rate and Rhythm: Normal rate and regular rhythm.     Pulses: Normal pulses.     Heart sounds: Normal heart sounds. No murmur heard.    No friction rub. No gallop.  Pulmonary:     Effort: Pulmonary effort is normal. No respiratory distress.     Breath sounds: Normal breath sounds. No stridor. No wheezing, rhonchi or rales.  Chest:     Chest  wall: No tenderness.  Musculoskeletal:        General: Normal range of motion.     Cervical back: Normal range of motion and neck supple.  Skin:    General: Skin is warm and dry.     Capillary Refill: Capillary refill takes less than 2 seconds.     Coloration: Skin is not jaundiced or pale.     Findings: No bruising, erythema, lesion or rash.  Neurological:     General: No focal deficit present.     Mental Status: She is alert and oriented to person, place, and time. Mental status is at baseline.  Psychiatric:        Mood and Affect: Mood normal.        Behavior: Behavior normal.        Thought Content: Thought content normal.        Judgment: Judgment normal.     Results for orders placed or performed in visit on 03/16/24  Calprotectin, Fecal   Collection Time: 03/16/24 11:10 AM    Specimen: Stool  Result Value Ref Range   Calprotectin, Fecal 57 0 - 120 ug/g      Assessment & Plan:   Problem List Items Addressed This Visit       Other   Morbid obesity (HCC) - Primary   Weight is stable with the addition of mirtazapine with her psychiatrist. Will increase her wegovy  to 1mg  and recheck in 1 month. Call with any concerns.       Relevant Medications   Semaglutide -Weight Management (WEGOVY ) 1 MG/0.5ML SOAJ     Follow up plan: Return in about 4 weeks (around 06/07/2024) for OK to use same day or double book if needed.

## 2024-06-02 DIAGNOSIS — F411 Generalized anxiety disorder: Secondary | ICD-10-CM | POA: Diagnosis not present

## 2024-06-02 DIAGNOSIS — F410 Panic disorder [episodic paroxysmal anxiety] without agoraphobia: Secondary | ICD-10-CM | POA: Diagnosis not present

## 2024-06-02 DIAGNOSIS — F900 Attention-deficit hyperactivity disorder, predominantly inattentive type: Secondary | ICD-10-CM | POA: Diagnosis not present

## 2024-06-02 DIAGNOSIS — F3162 Bipolar disorder, current episode mixed, moderate: Secondary | ICD-10-CM | POA: Diagnosis not present

## 2024-06-10 ENCOUNTER — Ambulatory Visit: Admitting: Family Medicine

## 2024-07-07 ENCOUNTER — Other Ambulatory Visit: Payer: Self-pay | Admitting: Family Medicine

## 2024-07-08 NOTE — Telephone Encounter (Signed)
 Requested Prescriptions  Refused Prescriptions Disp Refills   metoprolol  succinate (TOPROL -XL) 25 MG 24 hr tablet [Pharmacy Med Name: METOPROLOL  SUCC ER 25 MG TAB] 15 tablet 1    Sig: TAKE 1/2 TABLET BY MOUTH EVERY DAY     Cardiovascular:  Beta Blockers Passed - 07/08/2024  1:08 PM      Passed - Last BP in normal range    BP Readings from Last 1 Encounters:  05/10/24 131/83         Passed - Last Heart Rate in normal range    Pulse Readings from Last 1 Encounters:  05/10/24 (!) 103         Passed - Valid encounter within last 6 months    Recent Outpatient Visits           1 month ago Morbid obesity (HCC)   [facility] [place], Megan P, DO   3 months ago Crohn's disease of small intestine with intestinal obstruction (HCC)   [facility] [place], Megan P, DO   5 months ago Essential hypertension   [facility] [place], Har[place], DO

## 2024-07-28 ENCOUNTER — Ambulatory Visit: Admitting: Family Medicine
